# Patient Record
Sex: Male | Born: 1995
Health system: Southern US, Community
[De-identification: ages and names within clinical notes are randomized; demographics above are authoritative.]

## PROBLEM LIST (undated history)

## (undated) DIAGNOSIS — F32A Depression, unspecified: Secondary | ICD-10-CM

## (undated) DIAGNOSIS — F329 Major depressive disorder, single episode, unspecified: Secondary | ICD-10-CM

## (undated) DIAGNOSIS — R899 Unspecified abnormal finding in specimens from other organs, systems and tissues: Secondary | ICD-10-CM

## (undated) DIAGNOSIS — F1911 Other psychoactive substance abuse, in remission: Secondary | ICD-10-CM

## (undated) DIAGNOSIS — F29 Unspecified psychosis not due to a substance or known physiological condition: Secondary | ICD-10-CM

## (undated) DIAGNOSIS — F319 Bipolar disorder, unspecified: Secondary | ICD-10-CM

## (undated) DIAGNOSIS — F419 Anxiety disorder, unspecified: Secondary | ICD-10-CM

## (undated) HISTORY — DX: Other psychoactive substance abuse, in remission: F19.11

## (undated) HISTORY — PX: TONSILLECTOMY AND ADENOIDECTOMY: SHX28

## (undated) HISTORY — DX: Unspecified abnormal finding in specimens from other organs, systems and tissues: R89.9

## (undated) HISTORY — DX: Major depressive disorder, single episode, unspecified: F32.9

## (undated) HISTORY — DX: Unspecified psychosis not due to a substance or known physiological condition: F29

## (undated) HISTORY — DX: Anxiety disorder, unspecified: F41.9

## (undated) HISTORY — DX: Bipolar disorder, unspecified: F31.9

## (undated) HISTORY — DX: Depression, unspecified: F32.A

---
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
## 2014-01-14 ENCOUNTER — Telehealth (HOSPITAL_COMMUNITY): Payer: Self-pay

## 2014-01-14 ENCOUNTER — Other Ambulatory Visit (HOSPITAL_COMMUNITY): Payer: Self-pay | Admitting: Physician Assistant

## 2014-01-14 DIAGNOSIS — F32A Depression, unspecified: Secondary | ICD-10-CM

## 2014-01-14 DIAGNOSIS — F3110 Bipolar disorder, current episode manic without psychotic features, unspecified: Secondary | ICD-10-CM

## 2014-01-14 DIAGNOSIS — F329 Major depressive disorder, single episode, unspecified: Secondary | ICD-10-CM

## 2014-01-14 MED ORDER — DIVALPROEX SODIUM 500 MG PO DR TAB: DELAYED_RELEASE_TABLET | ORAL | Status: DC

## 2014-01-14 MED ORDER — TRAZODONE HCL 50 MG PO TABS: 50.0000 mg | ORAL_TABLET | Freq: Every day | ORAL | Status: DC

## 2014-01-14 MED ORDER — RISPERIDONE 1 MG PO TABS: ORAL_TABLET | ORAL | Status: DC

## 2014-01-14 MED ORDER — BENZTROPINE MESYLATE 1 MG PO TABS: 1.0000 mg | ORAL_TABLET | Freq: Two times a day (BID) | ORAL | Status: DC

## 2014-01-14 NOTE — Telephone Encounter (Signed)
PT needs a script for Depakote  divalproex (DEPAKOTE) 500 MG DR tablet (3 pills a day)  1 -in the am 2- in the pm  Please send it to the pharmacy  - PT will be out of this on Monday.   benztropine (COGENTIN) 1 MG tablet - PT will run out of this on the 27th and needs approval risperiDONE (RISPERDAL) 1 MG tablet - PT has plenty of this but would like speak to you about changing to something else, he feels this makes his brain mushy.    Please call grandmother if you have any questions.   Aldean BakerLaura Levin work # [phone] Home # [phone]

## 2014-01-14 NOTE — Telephone Encounter (Signed)
Medication refilled as noted. Rona RavensNeil T. Angeliyah Kirkey RPAC 7:18 PM 01/14/2014

## 2014-01-15 NOTE — Telephone Encounter (Signed)
Medication is refilled and LMOM stating such, and that we would discuss making changes to his medication on his next office visit. He can call back if he has any questions. Rona RavensNeil T. Jaicion Laurie RPAC 9:47 AM 01/15/2014

## 2014-02-11 ENCOUNTER — Encounter (INDEPENDENT_AMBULATORY_CARE_PROVIDER_SITE_OTHER): Payer: Self-pay

## 2014-02-11 ENCOUNTER — Ambulatory Visit (INDEPENDENT_AMBULATORY_CARE_PROVIDER_SITE_OTHER): Payer: BC Managed Care – PPO | Admitting: Physician Assistant

## 2014-02-11 VITALS — BP 124/81 | HR 90 | Ht 67.0 in | Wt 237.0 lb

## 2014-02-11 DIAGNOSIS — F3110 Bipolar disorder, current episode manic without psychotic features, unspecified: Secondary | ICD-10-CM

## 2014-02-11 DIAGNOSIS — F250 Schizoaffective disorder, bipolar type: Secondary | ICD-10-CM

## 2014-02-11 DIAGNOSIS — F1911 Other psychoactive substance abuse, in remission: Secondary | ICD-10-CM

## 2014-02-11 DIAGNOSIS — F32A Depression, unspecified: Secondary | ICD-10-CM

## 2014-02-11 DIAGNOSIS — F329 Major depressive disorder, single episode, unspecified: Secondary | ICD-10-CM

## 2014-02-11 DIAGNOSIS — R899 Unspecified abnormal finding in specimens from other organs, systems and tissues: Secondary | ICD-10-CM

## 2014-02-11 MED ORDER — DIVALPROEX SODIUM 500 MG PO DR TAB: DELAYED_RELEASE_TABLET | ORAL | Status: DC

## 2014-02-11 MED ORDER — RISPERIDONE 1 MG PO TABS: ORAL_TABLET | ORAL | Status: DC

## 2014-02-11 MED ORDER — ARIPIPRAZOLE 5 MG PO TABS: ORAL_TABLET | ORAL | Status: DC

## 2014-02-11 NOTE — Progress Notes (Signed)
[facility] [id number] Progress Note  Andrew LuisRobert Noble [id number] 18 y.o.  02/11/2014 3:21 PM  Chief Complaint: Bipolar disorder Schizoaffective type  History of Present Illness:  Subjective:Andrew Noble is in with his grandparents today. He is anxious to discuss medication changes as his prolactin level was elevated as was his depakote level.  He describes "medicine head." and feeling internal anxiety and nervousness. He also notes that he is feeling much better than he did upon his arrival.  He says he continues to stutter especially when speaking with his parents on the phone.  He also notes that he has difficulty remembering things and often loses things.  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  [name] is also now doing some modeling with him as well.  [name] is currently planning on being an "elf" with his grandfather during the holidays as he is the ReunionSanta at many local events including the [employer].  He does note that he is much improved since his first visit here and that the medication has been helpful to him.  Objective: [name] is A&O x 3, he is neatly dressed, good eye contact, speech is clear w/o hesitation or stuttering. No SI/HI or AVH. No racing thoughts or obsessions. He can focus and follow a line of thought. Some difficulty describing what sounds like cognitive slowing, but he can get the point across.    Suicidal Ideation: No Plan Formed: No Patient has means to carry out plan: No  Homicidal Ideation: No Plan Formed: No Patient has means to carry out plan: No  Review of Systems: Psychiatric: Agitation: No Hallucination: No Depressed Mood: Yes 4/10 Insomnia: No Hypersomnia: No Altered Concentration: Yes fogets things and loses things Feels Worthless: No Grandiose Ideas:  No Belief In Special Powers: No New/Increased Substance Abuse: No Compulsions: Yes  pacing  Neurologic: Headache: No Seizure: No Paresthesias: No  Past Medical Family, Social History: No changes  Outpatient Encounter Prescriptions as of 12/17/2013  Medication Sig  . benztropine (COGENTIN) 1 MG tablet Take 1 tablet (1 mg total) by mouth 2 (two) times daily.  . divalproex (DEPAKOTE) 500 MG DR tablet Take one tablet each morning (500mg ) and two tablets each evening at bedtime (1000mg ).  . risperiDONE (RISPERDAL) 1 MG tablet Take one tablet 1mg  po each morning, and  2mg  (two tablets) at bedtime.  . traZODone (DESYREL) 50 MG tablet Take 1 tablet (50 mg total) by mouth at bedtime.  . hydrOXYzine (ATARAX/VISTARIL) 25 MG tablet     Past Psychiatric History/Hospitalization(s): Anxiety: Yes  8/10 Bipolar Disorder: Yes Depression: Yes  7/10 but notes that it is situational and comes and goes Mania: No Psychosis: No Schizophrenia: No Personality Disorder: No Hospitalization for psychiatric illness: Yes History of Electroconvulsive Shock Therapy: No Prior Suicide Attempts: Yes  Physical Exam: Constitutional:  BP 124/81  Pulse 90  Ht 5\' 7"  (1.702 m)  Wt 237 lb (107.502 kg)  BMI 37.11 kg/m2  General Appearance: alert, oriented, no acute distress  Musculoskeletal: Strength & Muscle Tone: within normal limits Gait & Station: normal Patient leans: N/A  Psychiatric: Speech (describe rate, volume, coherence, spontaneity, and abnormalities if any): normal  Thought Process (describe rate, content, abstract reasoning, and computation): goal directed and relevant  Associations: Coherent and Relevant  Thoughts: normal  Mental Status: Orientation: oriented to place, time/date,  situation, day of week and month of year Mood & Affect: normal affect Attention Span & Concentration: fair  Medical Decision Making (Choose Three): Established Problem, Stable/Improving (1) and New Problem,  with no additional work-up planned (3)  Assessment: AXIS I  Bipolar schizoaffective type, Possible Akathesia, abnormal lab results  AXIS II  Deferred   AXIS III  Past Medical History    Diagnosis  Date    .  Depression     .  Psychosis     .  Anxiety     .  Bipolar disorder    AXIS IV  occupational problems, problems related to social environment, problems with access to health care services and problems with primary support group   AXIS V  61-70 mild symptoms        Plan: 1. Bipolar disorder schizoaffective type:  After discussion with patient and grand parents, he would like to discontinue the Risperdal and try Abilify.  Consult done with Pharm D. Via phone who suggested the following: A. Stop morning Risperdal dose. B. Add Abilify 5mg  each morning. C. Continue the PM dose of Risperdal as a cross taper over next several weeks. D. Follow up in 2-3 weeks. Will then increase Abilify if tolerated, and continue to decrease the Risperdal. C. Once Risperdal d/c is complete will then recheck Depakote levels with a goal of decreasing this as his last levels were 121.9. 2. Grandparents understand as does the patient on changes in regimen. 3. Greater than 505 of this visit is spent in counseling and education. 4. Follow up 2-3 weeks. Jennalee Greaves, PA-C 02/11/2014

## 2014-02-12 ENCOUNTER — Encounter (HOSPITAL_COMMUNITY): Payer: Self-pay | Admitting: Physician Assistant

## 2014-02-12 DIAGNOSIS — F1911 Other psychoactive substance abuse, in remission: Secondary | ICD-10-CM

## 2014-02-12 DIAGNOSIS — R899 Unspecified abnormal finding in specimens from other organs, systems and tissues: Secondary | ICD-10-CM

## 2014-02-12 HISTORY — DX: Unspecified abnormal finding in specimens from other organs, systems and tissues: R89.9

## 2014-02-12 HISTORY — DX: Other psychoactive substance abuse, in remission: F19.11

## 2014-03-05 ENCOUNTER — Ambulatory Visit (HOSPITAL_COMMUNITY): Payer: Self-pay | Admitting: Physician Assistant

## 2014-03-06 ENCOUNTER — Ambulatory Visit (INDEPENDENT_AMBULATORY_CARE_PROVIDER_SITE_OTHER): Payer: BC Managed Care – PPO | Admitting: Physician Assistant

## 2014-03-06 ENCOUNTER — Encounter (HOSPITAL_COMMUNITY): Payer: Self-pay | Admitting: Physician Assistant

## 2014-03-06 DIAGNOSIS — F250 Schizoaffective disorder, bipolar type: Secondary | ICD-10-CM

## 2014-03-06 DIAGNOSIS — F3110 Bipolar disorder, current episode manic without psychotic features, unspecified: Secondary | ICD-10-CM

## 2014-03-06 DIAGNOSIS — F329 Major depressive disorder, single episode, unspecified: Secondary | ICD-10-CM

## 2014-03-06 DIAGNOSIS — F32A Depression, unspecified: Secondary | ICD-10-CM

## 2014-03-06 MED ORDER — ARIPIPRAZOLE 10 MG PO TABS: ORAL_TABLET | ORAL | Status: DC

## 2014-03-06 NOTE — Progress Notes (Signed)
[facility] [id number] Progress Note  Darrin LuisRobert Underdown [id number] 18 y.o.  03/06/2014 3:58 PM  Chief Complaint: Bipolar disorder Schizoaffective type  History of Present Illness: Medication reduction Subjective: Patient has done well with the reduction in Risperdal and the addition of Abilify. NO complaints, no side effects and feels that his head is clearing.   Objective: [name] is looking good more polished, speech is clear. 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 No new problems. Suicidal Ideation: No Plan Formed: No Patient has means to carry out plan: No  Homicidal Ideation: No Plan Formed: No Patient has means to carry out plan: No  Review of Systems: Psychiatric: Agitation: No Hallucination: No Depressed Mood: Yes 4/10 Insomnia: No Hypersomnia: No Altered Concentration: Yes fogets things and loses things Feels Worthless: No Grandiose Ideas: No Belief In Special Powers: No New/Increased Substance Abuse: No Compulsions: Yes  pacing  Neurologic: Headache: No Seizure: No Paresthesias: No  Past Medical Family, Social History: No changes  Outpatient Encounter Prescriptions as of 12/17/2013  Medication Sig  . benztropine (COGENTIN) 1 MG tablet Take 1 tablet (1 mg total) by mouth 2 (two) times daily.  . divalproex (DEPAKOTE) 500 MG DR tablet Take one tablet each morning (500mg ) and two tablets each evening at bedtime (1000mg ).  . risperiDONE (RISPERDAL) 1 MG tablet Take one tablet 1mg  po each morning, and  2mg  (two tablets) at bedtime.  . traZODone (DESYREL) 50 MG tablet Take 1 tablet (50 mg total) by mouth at bedtime.  . hydrOXYzine (ATARAX/VISTARIL) 25 MG tablet     Past Psychiatric History/Hospitalization(s): Anxiety: Yes  8/10 Bipolar Disorder: Yes Depression: Yes  7/10 but notes that it is situational and comes and goes Mania: No Psychosis: No Schizophrenia: No Personality Disorder: No Hospitalization for  psychiatric illness: Yes History of Electroconvulsive Shock Therapy: No Prior Suicide Attempts: Yes  Physical Exam: Constitutional:  There were no vitals taken for this visit.  General Appearance: alert, oriented, no acute distress  Musculoskeletal: Strength & Muscle Tone: within normal limits Gait & Station: normal Patient leans: N/A  Psychiatric: Speech (describe rate, volume, coherence, spontaneity, and abnormalities if any): normal  Thought Process (describe rate, content, abstract reasoning, and computation): goal directed and relevant  Associations: Coherent and Relevant  Thoughts: normal  Mental Status: Orientation: oriented to place, time/date, situation, day of week and month of year Mood & Affect: normal affect Attention Span & Concentration: fair  Medical Decision Making (Choose Three): Established Problem, Stable/Improving (1) and New Problem, with no additional work-up planned (3)  Assessment: AXIS I  Bipolar schizoaffective type, Possible Akathesia, abnormal lab results  AXIS II  Deferred   AXIS III  Past Medical History    Diagnosis  Date    .  Depression     .  Psychosis     .  Anxiety     .  Bipolar disorder    AXIS IV  occupational problems, problems related to social environment, problems with access to health care services and problems with primary support group   AXIS V  61-70 mild symptoms        Plan: 1. Bipolar disorder schizoaffective type:   2.  Will increase the Abilify to 10mg  po each AM. 3. Decrease PM dose of Risperdal to 1mg  at hs. 4. Continue all other medications as written. 5. Follow up 2-3 weeks. 6. Can take 0.5mg  of Risperdal extra at hs if symptoms increase with reduction in pm dose.  C. Once Risperdal d/c is complete will then recheck Depakote levels with a goal of decreasing this as his last levels were 121.9. 2. Grandparents understand as does the patient on changes in regimen. 3. Greater than 50% of this visit is spent  in counseling and education. 4. Follow up 2-3 weeks. Brynlyn Dade, PA-C 03/06/2014

## 2014-03-06 NOTE — Patient Instructions (Signed)
1. Take 10mg  of Abilify each AM. 2. Take 1mg  of Risperdal at bedtime. 3. Take all other medications as written. 4. You make take 1/2 of a Risperdal (0.5) mg if symptoms increase. 5. Follow up 2-3 weeks.

## 2014-03-07 ENCOUNTER — Other Ambulatory Visit (HOSPITAL_COMMUNITY): Payer: Self-pay | Admitting: Physician Assistant

## 2014-03-13 ENCOUNTER — Telehealth (HOSPITAL_COMMUNITY): Payer: Self-pay | Admitting: Physician Assistant

## 2014-03-18 ENCOUNTER — Ambulatory Visit (HOSPITAL_COMMUNITY): Payer: Self-pay | Admitting: Physician Assistant

## 2014-03-21 ENCOUNTER — Other Ambulatory Visit (HOSPITAL_COMMUNITY): Payer: Self-pay | Admitting: *Deleted

## 2014-03-21 DIAGNOSIS — F329 Major depressive disorder, single episode, unspecified: Secondary | ICD-10-CM

## 2014-03-21 DIAGNOSIS — F32A Depression, unspecified: Secondary | ICD-10-CM

## 2014-03-21 DIAGNOSIS — F3110 Bipolar disorder, current episode manic without psychotic features, unspecified: Secondary | ICD-10-CM

## 2014-03-21 MED ORDER — DIVALPROEX SODIUM 500 MG PO DR TAB: DELAYED_RELEASE_TABLET | ORAL | Status: DC

## 2014-03-21 NOTE — Telephone Encounter (Signed)
Called pharmacy for medication refill for Depakote 500 mg, Qty 90, per Dr. Gilmore LarocheAkhtar. Pt has a follow up appt on 12/9. Called and informed pt prescription will be ready on 12/4. Pt states and shows understanding.

## 2014-03-21 NOTE — Telephone Encounter (Signed)
Please call in script to pharmacy.

## 2014-03-26 ENCOUNTER — Ambulatory Visit (HOSPITAL_COMMUNITY): Payer: Self-pay | Admitting: Physician Assistant

## 2014-03-27 ENCOUNTER — Ambulatory Visit (HOSPITAL_COMMUNITY): Payer: Self-pay | Admitting: Physician Assistant

## 2014-03-28 ENCOUNTER — Ambulatory Visit (INDEPENDENT_AMBULATORY_CARE_PROVIDER_SITE_OTHER): Payer: BC Managed Care – PPO | Admitting: Physician Assistant

## 2014-03-28 ENCOUNTER — Encounter (INDEPENDENT_AMBULATORY_CARE_PROVIDER_SITE_OTHER): Payer: Self-pay

## 2014-03-28 ENCOUNTER — Encounter (HOSPITAL_COMMUNITY): Payer: Self-pay | Admitting: Physician Assistant

## 2014-03-28 VITALS — BP 119/74 | HR 95 | Ht 67.0 in | Wt 238.0 lb

## 2014-03-28 DIAGNOSIS — F32A Depression, unspecified: Secondary | ICD-10-CM

## 2014-03-28 DIAGNOSIS — F329 Major depressive disorder, single episode, unspecified: Secondary | ICD-10-CM

## 2014-03-28 DIAGNOSIS — F250 Schizoaffective disorder, bipolar type: Secondary | ICD-10-CM

## 2014-03-28 DIAGNOSIS — F3110 Bipolar disorder, current episode manic without psychotic features, unspecified: Secondary | ICD-10-CM

## 2014-03-28 NOTE — Progress Notes (Signed)
[facility] [id number] Progress [facility]  Andrew Noble [id number] 18 y.o.  03/28/2014 3:40 PM  Chief Complaint: Bipolar disorder Schizoaffective type  History of Present Illness: Medication reduction Subjective: Andrew MaduroRobert is in today to follow up on his bipolar disorder. He continues to do well. He reports no difficulty with the reduction in the risperdal. He feels more energetic, he states his stuttering is decreasing, but still notes that he occasionally will forget his words or lose his train of thought.  Objective: patient is responding well to medication and shows no indication of decreased cognitive functioning.   Suicidal Ideation: No Plan Formed: No Patient has means to carry out plan: No  Homicidal Ideation: No Plan Formed: No Patient has means to carry out plan: No  Review of Systems: Psychiatric: Agitation: No Hallucination: No Depressed Mood: Yes 4/10 Insomnia: No Hypersomnia: No Altered Concentration: Yes fogets things and loses things Feels Worthless: No Grandiose Ideas: No Belief In Special Powers: No New/Increased Substance Abuse: No Compulsions: Yes  pacing  Neurologic: Headache: No Seizure: No Paresthesias: No  Past Medical Family, Social History: No changes  Outpatient Encounter Prescriptions as of 12/17/2013  Medication Sig  . benztropine (COGENTIN) 1 MG tablet Take 1 tablet (1 mg total) by mouth 2 (two) times daily.  . divalproex (DEPAKOTE) 500 MG DR tablet Take one tablet each morning (500mg ) and two tablets each evening at bedtime (1000mg ).  . risperiDONE (RISPERDAL) 1 MG tablet Take one tablet 1mg  po each morning, and  2mg  (two tablets) at bedtime.  . traZODone (DESYREL) 50 MG tablet Take 1 tablet (50 mg total) by mouth at bedtime.  . hydrOXYzine (ATARAX/VISTARIL) 25 MG tablet     Past Psychiatric History/Hospitalization(s): Anxiety: Yes  8/10 Bipolar Disorder: Yes Depression: Yes  7/10 but notes that it is situational and comes and  goes Mania: No Psychosis: No Schizophrenia: No Personality Disorder: No Hospitalization for psychiatric illness: Yes History of Electroconvulsive Shock Therapy: No Prior Suicide Attempts: Yes  Physical Exam: Constitutional:  BP 119/74 mmHg  Pulse 95  Ht 5\' 7"  (1.702 m)  Wt 238 lb (107.956 kg)  BMI 37.27 kg/m2  General Appearance: alert, oriented, no acute distress  Musculoskeletal: Strength & Muscle Tone: within normal limits Gait & Station: normal Patient leans: N/A  Psychiatric: Speech (describe rate, volume, coherence, spontaneity, and abnormalities if any): normal  Thought Process (describe rate, content, abstract reasoning, and computation): goal directed and relevant  Associations: Coherent and Relevant  Thoughts: normal  Mental Status: Orientation: oriented to place, time/date, situation, day of week and month of year Mood & Affect: normal affect Attention Span & Concentration: fair  Medical Decision Making (Choose Three): Established Problem, Stable/Improving (1) and New Problem, with no additional work-up planned (3)  Assessment: AXIS I  Bipolar schizoaffective type, Possible Akathesia, abnormal lab results  AXIS II  Deferred   AXIS III  Past Medical History    Diagnosis  Date    .  Depression     .  Psychosis     .  Anxiety     .  Bipolar disorder    AXIS IV  occupational problems, problems related to social environment, problems with access to health care services and problems with primary support group   AXIS V  61-70 mild symptoms        Plan: 1. Bipolar disorder schizoaffective type:    1. Will continue the Abilify to 10mg  po each AM   2. D/C Risperdal completely.   3. Will  follow up in 1 month and continue other medications as noted. 4. Must see therapist as discussed. Andrew Janssen, PA-C 03/28/2014

## 2014-04-17 ENCOUNTER — Other Ambulatory Visit (HOSPITAL_COMMUNITY): Payer: Self-pay | Admitting: *Deleted

## 2014-04-17 DIAGNOSIS — F32A Depression, unspecified: Secondary | ICD-10-CM

## 2014-04-17 DIAGNOSIS — F329 Major depressive disorder, single episode, unspecified: Secondary | ICD-10-CM

## 2014-04-17 DIAGNOSIS — F3110 Bipolar disorder, current episode manic without psychotic features, unspecified: Secondary | ICD-10-CM

## 2014-04-17 NOTE — Telephone Encounter (Signed)
PT's grandmother called for a refill for Depakote. [facility] [place] per Verne SpurrNeil Mashburn, [place], pt is authorized for 1 refill, Qty 90. Pt has follow up w/ Lloyd HugerNeil on 1/12. Pt states and shows understanding.

## 2014-04-29 ENCOUNTER — Ambulatory Visit (INDEPENDENT_AMBULATORY_CARE_PROVIDER_SITE_OTHER): Payer: BLUE CROSS/BLUE SHIELD | Admitting: Physician Assistant

## 2014-04-29 ENCOUNTER — Encounter (INDEPENDENT_AMBULATORY_CARE_PROVIDER_SITE_OTHER): Payer: Self-pay

## 2014-04-29 ENCOUNTER — Encounter (HOSPITAL_COMMUNITY): Payer: Self-pay | Admitting: Physician Assistant

## 2014-04-29 VITALS — BP 130/69 | HR 86 | Ht 67.0 in | Wt 246.0 lb

## 2014-04-29 DIAGNOSIS — F3110 Bipolar disorder, current episode manic without psychotic features, unspecified: Secondary | ICD-10-CM

## 2014-04-29 DIAGNOSIS — F32A Depression, unspecified: Secondary | ICD-10-CM

## 2014-04-29 DIAGNOSIS — F250 Schizoaffective disorder, bipolar type: Secondary | ICD-10-CM

## 2014-04-29 DIAGNOSIS — F329 Major depressive disorder, single episode, unspecified: Secondary | ICD-10-CM

## 2014-04-29 DIAGNOSIS — F1911 Other psychoactive substance abuse, in remission: Secondary | ICD-10-CM

## 2014-04-29 MED ORDER — ARIPIPRAZOLE 10 MG PO TABS: ORAL_TABLET | ORAL | Status: DC

## 2014-04-29 NOTE — Patient Instructions (Signed)
1. Continue all medication as ordered. 2. Call this office if you have any questions or concerns. 3. Continue to get regular exercise 3-5 times a week. 4. Continue to eat a healthy nutritionally balanced diet. 5. Continue to reduce stress and anxiety through activities such as yoga, mindfulness, meditation and or prayer. 6. Keep all appointments with your out patient therapist and have notes forwarded to this office. (If you do not have one and would like to be scheduled with a therapist, please let our office assist you with this. 7. Follow up as planned 1 month. 8. Labs as directed.

## 2014-04-29 NOTE — Addendum Note (Signed)
Addended by: Verne SpurrMASHBURN, Nayvie Lips T on: 04/29/2014 04:02 PM   Modules accepted: Orders

## 2014-04-29 NOTE — Progress Notes (Signed)
[facility] [id number] Progress Note  Andrew Noble [id number] 19 y.o.  04/29/2014 3:10 PM  Chief Complaint: Bipolar disorder Schizoaffective type  History of Present Illness: Medication reduction Subjective: Andrew Noble is in today to follow up on his bipolar disorder. He meets with me privately to discuss some concerns he has been having.  Objective: patient is responding well to medication and shows no indication of decreased cognitive functioning.   Suicidal Ideation: No Plan Formed: No Patient has means to carry out plan: No  Homicidal Ideation: No Plan Formed: No Patient has means to carry out plan: No  Review of Systems: Psychiatric: Agitation: No Hallucination: No Depressed Mood: Yes 4/10 Insomnia: No Hypersomnia: No Altered Concentration: Yes fogets things and loses things Feels Worthless: No Grandiose Ideas: No Belief In Special Powers: No New/Increased Substance Abuse: No Compulsions: Yes  pacing  Neurologic: Headache: No Seizure: No Paresthesias: No  Past Medical Family, Social History: No changes  Outpatient Encounter Prescriptions as of 12/17/2013  Medication Sig  . benztropine (COGENTIN) 1 MG tablet Take 1 tablet (1 mg total) by mouth 2 (two) times daily.  . divalproex (DEPAKOTE) 500 MG DR tablet Take one tablet each morning ( ) and two tablets each evening at bedtime ( ).  . risperiDONE (RISPERDAL) 1 MG tablet Take one tablet  po each morning, and   (two tablets) at bedtime.  . traZODone (DESYREL) 50 MG tablet Take 1 tablet (50 mg total) by mouth at bedtime.  . hydrOXYzine (ATARAX/VISTARIL) 25 MG tablet     Past Psychiatric History/Hospitalization(s): Anxiety: Yes  8/10 Bipolar Disorder: Yes Depression: Yes  7/10 but notes that it is situational and comes and goes Mania: No Psychosis: No Schizophrenia: No Personality Disorder: No Hospitalization for psychiatric illness: Yes History of Electroconvulsive Shock Therapy:  No Prior Suicide Attempts: Yes  Physical Exam: Constitutional:  BP 130/69 mmHg  Pulse 86  Ht  (1.702 m)  Wt 246 lb (111.585 kg)  BMI 38.52 kg/m2  General Appearance: alert, oriented, no acute distress  Musculoskeletal: Strength & Muscle Tone: within normal limits Gait & Station: normal Patient leans: N/A  Psychiatric: Speech (describe rate, volume, coherence, spontaneity, and abnormalities if any): normal  Thought Process (describe rate, content, abstract reasoning, and computation): goal directed and relevant  Associations: Coherent and Relevant  Thoughts: normal  Mental Status: Orientation: oriented to place, time/date, situation, day of week and month of year Mood & Affect: normal affect Attention Span & Concentration: fair  Medical Decision Making (Choose Three): Established Problem, Stable/Improving (1) and New Problem, with no additional work-up planned (3)  Assessment: AXIS I  Bipolar schizoaffective type, Possible Akathesia, abnormal lab results  AXIS II  Deferred   AXIS III  Past Medical History    Diagnosis  Date    .  Depression     .  Psychosis     .  Anxiety     .  Bipolar disorder    AXIS IV  occupational problems, problems related to social environment, problems with access to health care services and problems with primary support group   AXIS V  61-70 mild symptoms        Plan: 1. Bipolar disorder schizoaffective type:    1. Will continue the Abilify to  po each hs   2.Will d/c AM depakote dose and order depakote level.   3. Will follow up in 1 month and continue other medications as noted. 4. Must see therapist as discussed. 5. Take meds at night as  discussed. 6. Regular sleep cycle. Andrew Landress, PA-C 04/29/2014

## 2014-04-30 LAB — VALPROIC ACID LEVEL: Valproic Acid Lvl: 83.4 ug/mL (ref 50.0–100.0)

## 2014-04-30 LAB — PROLACTIN: Prolactin: 5 ng/mL (ref 2.1–17.1)

## 2014-05-02 ENCOUNTER — Other Ambulatory Visit (HOSPITAL_COMMUNITY): Payer: Self-pay | Admitting: Physician Assistant

## 2014-05-08 MED ORDER — DIVALPROEX SODIUM 500 MG PO DR TAB: DELAYED_RELEASE_TABLET | ORAL | Status: DC

## 2014-05-14 ENCOUNTER — Other Ambulatory Visit (HOSPITAL_COMMUNITY): Payer: Self-pay | Admitting: Physician Assistant

## 2014-05-30 ENCOUNTER — Encounter (INDEPENDENT_AMBULATORY_CARE_PROVIDER_SITE_OTHER): Payer: Self-pay

## 2014-05-30 ENCOUNTER — Encounter (HOSPITAL_COMMUNITY): Payer: Self-pay | Admitting: Physician Assistant

## 2014-05-30 ENCOUNTER — Ambulatory Visit (INDEPENDENT_AMBULATORY_CARE_PROVIDER_SITE_OTHER): Payer: BLUE CROSS/BLUE SHIELD | Admitting: Physician Assistant

## 2014-05-30 VITALS — BP 127/84 | HR 85 | Ht 67.0 in | Wt 241.0 lb

## 2014-05-30 DIAGNOSIS — F3110 Bipolar disorder, current episode manic without psychotic features, unspecified: Secondary | ICD-10-CM

## 2014-05-30 DIAGNOSIS — F32A Depression, unspecified: Secondary | ICD-10-CM

## 2014-05-30 DIAGNOSIS — F329 Major depressive disorder, single episode, unspecified: Secondary | ICD-10-CM

## 2014-05-30 DIAGNOSIS — F250 Schizoaffective disorder, bipolar type: Secondary | ICD-10-CM

## 2014-05-30 MED ORDER — DIVALPROEX SODIUM 500 MG PO DR TAB: DELAYED_RELEASE_TABLET | ORAL | Status: DC

## 2014-05-30 MED ORDER — ARIPIPRAZOLE 10 MG PO TABS: ORAL_TABLET | ORAL | Status: DC

## 2014-05-30 NOTE — Progress Notes (Signed)
[facility] [id number] Progress Note  Andrew Noble [id number] 18 y.o.  05/30/2014 4:08 PM  Chief Complaint: Bipolar disorder Schizoaffective type  History of Present Illness: Medication reduction Subjective: Patient is in alone today. He has seen his therapist and discussed finding something that can occupy his time and make him productive.  He now has a goal chart regarding his interests.  He felt that he was more depressed because he really doesn't know what he wants to do currently and is looking for a job.  Objective: patient is responding well to medication and shows no indication of decreased cognitive functioning. He continues to work out and notes that it does help. He reports being very emotional when speaking to his dad, he becomes very sad.  He says he remembers things from his past.   Suicidal Ideation: No Plan Formed: No Patient has means to carry out plan: No  Homicidal Ideation: No Plan Formed: No Patient has means to carry out plan: No  Review of Systems: Psychiatric: Agitation: No Hallucination: No Depressed Mood: Yes 4/10 Insomnia: No Hypersomnia: No Altered Concentration: Yes fogets things and loses things Feels Worthless: No Grandiose Ideas: No Belief In Special Powers: No New/Increased Substance Abuse: No Compulsions: Yes  pacing  Neurologic: Headache: No Seizure: No Paresthesias: No  Past Medical Family, Social History: No changes  Outpatient Encounter Prescriptions as of 12/17/2013  Medication Sig  . benztropine (COGENTIN) 1 MG tablet Take 1 tablet (1 mg total) by mouth 2 (two) times daily.  . divalproex (DEPAKOTE) 500 MG DR tablet Take one tablet each morning ( ) and two tablets each evening at bedtime ( ).  . risperiDONE (RISPERDAL) 1 MG tablet Take one tablet  po each morning, and   (two tablets) at bedtime.  . traZODone (DESYREL) 50 MG tablet Take 1 tablet (50 mg total) by mouth at bedtime.  . hydrOXYzine  (ATARAX/VISTARIL) 25 MG tablet     Past Psychiatric History/Hospitalization(s): Anxiety: Yes  5-6/10 Bipolar Disorder: Yes Depression: Yes  4/10 but notes that it is situational and comes and goes Mania: No Psychosis: No Schizophrenia: No Personality Disorder: No Hospitalization for psychiatric illness: Yes History of Electroconvulsive Shock Therapy: No Prior Suicide Attempts: Yes  Physical Exam: Constitutional:  BP 127/84 mmHg  Pulse 85  Ht  (1.702 m)  Wt 241 lb (109.317 kg)  BMI 37.74 kg/m2  General Appearance: alert, oriented, no acute distress  Musculoskeletal: Strength & Muscle Tone: within normal limits Gait & Station: normal Patient leans: N/A  Psychiatric: Speech (describe rate, volume, coherence, spontaneity, and abnormalities if any): normal  Thought Process (describe rate, content, abstract reasoning, and computation): goal directed and relevant  Associations: Coherent and Relevant  Thoughts: normal  Mental Status: Orientation: oriented to place, time/date, situation, day of week and month of year Mood & Affect: normal affect Attention Span & Concentration: fair  Medical Decision Making (Choose Three): Established Problem, Stable/Improving (1) and New Problem, with no additional work-up planned (3)  Assessment: AXIS I  Bipolar schizoaffective type, Possible Akathesia, abnormal lab results  AXIS II  Deferred   AXIS III  Past Medical History    Diagnosis  Date    .  Depression     .  Psychosis     .  Anxiety     .  Bipolar disorder    AXIS IV  occupational problems, problems related to social environment, problems with access to health care services and problems with primary support group   AXIS V  61-70 mild symptoms        Plan: 1. Bipolar disorder schizoaffective type:    1. Will continue the Abilify to 10mg  po each hs   2  Andrew Noble MaduroRobert will continue his medication as noted currently. We will watch and see giving him more time between med  adjustments.  3.. He will continue on the 1000mg  of depakote at bedtime as written. 4.. Must see therapist as discussed. 5. Take meds at night as discussed. 6.  Regular sleep cycle. Andrew Clemons, PA-C 05/30/2014

## 2014-05-30 NOTE — Patient Instructions (Signed)
1. Continue all medication as ordered. 2. Call this office if you have any questions or concerns. 3. Continue to get regular exercise 3-5 times a week. 4. Continue to eat a healthy nutritionally balanced diet. 5. Continue to reduce stress and anxiety through activities such as yoga, mindfulness, meditation and or prayer. 6. Keep all appointments with your out patient therapist and have notes forwarded to this office. (If you do not have one and would like to be scheduled with a therapist, please let our office assist you with this. 7. Follow up as planned 8 weeks. 

## 2014-06-03 ENCOUNTER — Other Ambulatory Visit (HOSPITAL_COMMUNITY): Payer: Self-pay | Admitting: Physician Assistant

## 2014-06-06 ENCOUNTER — Other Ambulatory Visit (HOSPITAL_COMMUNITY): Payer: Self-pay | Admitting: *Deleted

## 2014-06-06 DIAGNOSIS — F3110 Bipolar disorder, current episode manic without psychotic features, unspecified: Secondary | ICD-10-CM

## 2014-06-06 DIAGNOSIS — F329 Major depressive disorder, single episode, unspecified: Secondary | ICD-10-CM

## 2014-06-06 DIAGNOSIS — F32A Depression, unspecified: Secondary | ICD-10-CM

## 2014-06-06 MED ORDER — DIVALPROEX SODIUM 500 MG PO DR TAB: DELAYED_RELEASE_TABLET | ORAL | Status: DC

## 2014-06-06 NOTE — Telephone Encounter (Signed)
Received phone call from BasaltAshley from [facility] for a refill for Depakote  Per Morrie SheldonAshley, they received the prescription for Abilify, but not the Depakote. Per Verne SpurrNeil Mashburn, PA-C, pt is authorized for a refill for Depakote 500mg , Qty 90. Pt has a f/u appt on 4/8.  Pharmacy will notify pt of prescription status.

## 2014-06-26 ENCOUNTER — Other Ambulatory Visit (HOSPITAL_COMMUNITY): Payer: Self-pay | Admitting: Physician Assistant

## 2014-07-01 ENCOUNTER — Other Ambulatory Visit (HOSPITAL_COMMUNITY): Payer: Self-pay | Admitting: Physician Assistant

## 2014-07-03 ENCOUNTER — Telehealth (HOSPITAL_COMMUNITY): Payer: Self-pay | Admitting: *Deleted

## 2014-07-03 DIAGNOSIS — F32A Depression, unspecified: Secondary | ICD-10-CM

## 2014-07-03 DIAGNOSIS — F3110 Bipolar disorder, current episode manic without psychotic features, unspecified: Secondary | ICD-10-CM

## 2014-07-03 DIAGNOSIS — F329 Major depressive disorder, single episode, unspecified: Secondary | ICD-10-CM

## 2014-07-03 MED ORDER — DIVALPROEX SODIUM 500 MG PO DR TAB
DELAYED_RELEASE_TABLET | ORAL | Status: DC
Start: 2014-07-03 — End: 2014-07-23

## 2014-07-03 NOTE — Telephone Encounter (Signed)
Pt's grandmother called for a refill for Depakote 500mg .  Pt's grandmother states insurance will only pay for a 30 day supply, Qty 60 of Depakote. Per Verne SpurrNeil Mashburn, PA-C, pt is authorized for a refill of Depakote 500mg , Qty 60. [facility] Pharmacy will notify pt of prescription status. Pt has a f/u appt on 4/8.

## 2014-07-23 ENCOUNTER — Telehealth (HOSPITAL_COMMUNITY): Payer: Self-pay | Admitting: *Deleted

## 2014-07-23 DIAGNOSIS — F32A Depression, unspecified: Secondary | ICD-10-CM

## 2014-07-23 DIAGNOSIS — F329 Major depressive disorder, single episode, unspecified: Secondary | ICD-10-CM

## 2014-07-23 DIAGNOSIS — F3110 Bipolar disorder, current episode manic without psychotic features, unspecified: Secondary | ICD-10-CM

## 2014-07-23 MED ORDER — DIVALPROEX SODIUM 500 MG PO DR TAB: DELAYED_RELEASE_TABLET | ORAL | Status: DC

## 2014-07-23 MED ORDER — ARIPIPRAZOLE 10 MG PO TABS: ORAL_TABLET | ORAL | Status: DC

## 2014-07-23 NOTE — Telephone Encounter (Signed)
Pt called for prescription refill for Abilify 10mg  and Depakote 500mg . Per Verne SpurrNeil Mashburn, PA-C, pt is authorized for a refill for Abilify 10mg , Qty 30 and Depakote 500mg , Qty 60. Prescription were sent to pharmacy. Called and informed pt of prescription status. Pt states and shows understanding.

## 2014-07-25 ENCOUNTER — Ambulatory Visit (HOSPITAL_COMMUNITY): Payer: Self-pay | Admitting: Physician Assistant

## 2014-08-06 ENCOUNTER — Encounter (HOSPITAL_COMMUNITY): Payer: Self-pay | Admitting: Physician Assistant

## 2014-08-06 ENCOUNTER — Ambulatory Visit (INDEPENDENT_AMBULATORY_CARE_PROVIDER_SITE_OTHER): Payer: BLUE CROSS/BLUE SHIELD | Admitting: Physician Assistant

## 2014-08-06 VITALS — BP 127/91 | HR 100

## 2014-08-06 DIAGNOSIS — F329 Major depressive disorder, single episode, unspecified: Secondary | ICD-10-CM

## 2014-08-06 DIAGNOSIS — F250 Schizoaffective disorder, bipolar type: Secondary | ICD-10-CM | POA: Diagnosis not present

## 2014-08-06 DIAGNOSIS — F32A Depression, unspecified: Secondary | ICD-10-CM

## 2014-08-06 DIAGNOSIS — F3110 Bipolar disorder, current episode manic without psychotic features, unspecified: Secondary | ICD-10-CM

## 2014-08-06 MED ORDER — ARIPIPRAZOLE 10 MG PO TABS: ORAL_TABLET | ORAL | Status: DC

## 2014-08-06 MED ORDER — DIVALPROEX SODIUM 500 MG PO DR TAB: DELAYED_RELEASE_TABLET | ORAL | Status: DC

## 2014-08-06 NOTE — Patient Instructions (Signed)
1. Take all of your medications as discussed with your provider. (Please check your AVS, for the list.) 2. Call this office for any questions or problems. 3. Be sure to get plenty of rest and try for 7-9 hours of quality sleep each night. 4. Try to get regular exercise, at least 15-30 minutes each day.  A good walk will help tremendously! 5. Remember to do your mindfulness each day, breath deeply in and out, while having quiet reflection, prayer, meditation, or positive visualization. Unplug and turn off all electronic devices each day for your own personal time without interruption. This works! There are studies to back this up! 6. Be sure to take your B complex and Vitamin D3 each day. This will improve your overall wellbeing and boost your immune system as well. 7. Try to eat a nutritious healthy diet and avoid excessive alcohol and ALL tobacco products. 8. Be sure to keep all of your appointments with your outpatient therapist. If you do not have one, our office will be happy to assist you with this. 9. Be sure to keep your next follow up appointment in 3 months. 

## 2014-08-06 NOTE — Progress Notes (Signed)
  [facility] [id number] Progress Note  Andrew LuisRobert Noble [id number] 18 y.o.  08/06/2014 3:37 PM  Chief Complaint: Bipolar disorder Schizoaffective type  History of Present Illness: Medication reduction Subjective:  Andrew MaduroRobert is doing very well. He is currently working at [employer] about 30+ hours per week. He likes it and his only problem is getting up in the morning.    Objective: Andrew MaduroRobert is doing very well. He is working and making good progress and he is well pleased with how things are going.     Suicidal Ideation: No Plan Formed: No Patient has means to carry out plan: No  Homicidal Ideation: No Plan Formed: No Patient has means to carry out plan: No  Review of Systems: Psychiatric: Agitation: No Hallucination: No Depressed Mood: Yes 4/10 Insomnia: No Hypersomnia: No Altered Concentration: Yes fogets things and loses things Feels Worthless: No Grandiose Ideas: No Belief In Special Powers: No New/Increased Substance Abuse: No Compulsions: Yes  pacing  Neurologic: Headache: No Seizure: No Paresthesias: No  Past Medical Family, Social History: No changes  Outpatient Encounter Prescriptions as of 12/17/2013  Medication Sig  . benztropine (COGENTIN) 1 MG tablet Take 1 tablet (1 mg total) by mouth 2 (two) times daily.  . divalproex (DEPAKOTE) 500 MG DR tablet Take one tablet each morning (500mg ) and two tablets each evening at bedtime (1000mg ).  . risperiDONE (RISPERDAL) 1 MG tablet Take one tablet 1mg  po each morning, and  2mg  (two tablets) at bedtime.  . traZODone (DESYREL) 50 MG tablet Take 1 tablet (50 mg total) by mouth at bedtime.  . hydrOXYzine (ATARAX/VISTARIL) 25 MG tablet     Past Psychiatric History/Hospitalization(s): Anxiety: Yes  5-6/10 Bipolar Disorder: Yes Depression: Yes  4/10 but notes that it is situational and comes and goes Mania: No Psychosis: No Schizophrenia: No Personality Disorder: No Hospitalization for psychiatric illness:  Yes History of Electroconvulsive Shock Therapy: No Prior Suicide Attempts: Yes  Physical Exam: Constitutional:  BP 127/91 mmHg  Pulse 100  General Appearance: alert, oriented, no acute distress  Musculoskeletal: Strength & Muscle Tone: within normal limits Gait & Station: normal Patient leans: N/A  Psychiatric: Speech (describe rate, volume, coherence, spontaneity, and abnormalities if any): normal  Thought Process (describe rate, content, abstract reasoning, and computation): goal directed and relevant  Associations: Coherent and Relevant  Thoughts: normal  Mental Status: Orientation: oriented to place, time/date, situation, day of week and month of year Mood & Affect: normal affect Attention Span & Concentration: fair  Medical Decision Making (Choose Three): Established Problem, Stable/Improving (1) and New Problem, with no additional work-up planned (3)  Assessment: AXIS I  Bipolar schizoaffective type, Possible Akathesia, abnormal lab results  AXIS II  Deferred   AXIS III  Past Medical History    Diagnosis  Date    .  Depression     .  Psychosis     .  Anxiety     .  Bipolar disorder    AXIS IV  occupational problems, problems related to social environment, problems with access to health care services and problems with primary support group   AXIS V  61-70 mild symptoms        Plan: 1. Bipolar disorder schizoaffective type:   1. Continue current medication as written. 2. Follow up in 3 months. 3. Follow up with therapist as discussed. 4. Call for questions or problems.  Ustin Cruickshank, PA-C 08/06/2014

## 2014-10-21 ENCOUNTER — Telehealth (HOSPITAL_COMMUNITY): Payer: Self-pay

## 2014-10-21 DIAGNOSIS — F329 Major depressive disorder, single episode, unspecified: Secondary | ICD-10-CM

## 2014-10-21 DIAGNOSIS — F32A Depression, unspecified: Secondary | ICD-10-CM

## 2014-10-21 DIAGNOSIS — F3110 Bipolar disorder, current episode manic without psychotic features, unspecified: Secondary | ICD-10-CM

## 2014-10-21 MED ORDER — ARIPIPRAZOLE 10 MG PO TABS
ORAL_TABLET | ORAL | Status: DC
Start: 2014-10-21 — End: 2014-10-31

## 2014-10-21 NOTE — Telephone Encounter (Signed)
PT would like to speak to you

## 2014-10-21 NOTE — Telephone Encounter (Signed)
Per Dawayne CirriNeil Mashbuurn, PA-C, pt will begin taking 5mg  of Abilify in the morning and 10mg  of Abilify at bedtime. Pt will continue taking Depakote as prescribed. Pt must keep all f/u appts. Pt will contact office if there are any questions or concerns or proceed to the nearest emergency room.  Called and informed pt of new direction for medication. Pt has a f/u appt on 7/15. Pt states and shows understanding.

## 2014-10-21 NOTE — Telephone Encounter (Signed)
Return call to pt. Pt states he is feeling more anxious and depressed. Pt states he is not having any HI or SI thoughts. Pt would like to discuss with Lloyd HugerNeil a medication change.

## 2014-10-29 ENCOUNTER — Other Ambulatory Visit (HOSPITAL_COMMUNITY): Payer: Self-pay | Admitting: Physician Assistant

## 2014-10-30 ENCOUNTER — Ambulatory Visit (HOSPITAL_COMMUNITY): Payer: Self-pay | Admitting: Physician Assistant

## 2014-10-31 ENCOUNTER — Ambulatory Visit (HOSPITAL_COMMUNITY): Payer: Self-pay | Admitting: Physician Assistant

## 2014-10-31 ENCOUNTER — Encounter (HOSPITAL_COMMUNITY): Payer: Self-pay | Admitting: Physician Assistant

## 2014-10-31 ENCOUNTER — Ambulatory Visit (INDEPENDENT_AMBULATORY_CARE_PROVIDER_SITE_OTHER): Payer: BLUE CROSS/BLUE SHIELD | Admitting: Physician Assistant

## 2014-10-31 VITALS — BP 126/74 | HR 80 | Ht 67.0 in | Wt 258.0 lb

## 2014-10-31 DIAGNOSIS — F3110 Bipolar disorder, current episode manic without psychotic features, unspecified: Secondary | ICD-10-CM

## 2014-10-31 DIAGNOSIS — F329 Major depressive disorder, single episode, unspecified: Secondary | ICD-10-CM

## 2014-10-31 DIAGNOSIS — F32A Depression, unspecified: Secondary | ICD-10-CM

## 2014-10-31 DIAGNOSIS — F250 Schizoaffective disorder, bipolar type: Secondary | ICD-10-CM

## 2014-10-31 MED ORDER — DIVALPROEX SODIUM 500 MG PO DR TAB: DELAYED_RELEASE_TABLET | ORAL | Status: DC

## 2014-10-31 MED ORDER — ARIPIPRAZOLE 10 MG PO TABS: ORAL_TABLET | ORAL | Status: DC

## 2014-10-31 NOTE — Progress Notes (Signed)
[facility] [id number] [facility]  Andrew Noble [id number] 19 y.o.  10/31/2014 10:14 AM  Chief Complaint: Bipolar disorder Schizoaffective type  History of Present Illness: Medication reduction Subjective:  Andrew MaduroRobert is doing very well. His medication was increased due to his report of worsening symptoms of anxiety and depression to include Abilify 5mg  in the morning and 10mg  at hs.  He reports that the changes helped a great deal and he feels much better. He is working a lot but he continues to see his therapist.  He is paying rent now and making an effort to be more helpful around the house.   Objective: Andrew MaduroRobert is doing very well. He is working and making good progress and he is well pleased with how things are going.   Suicidal Ideation: No Plan Formed: No Patient has means to carry out plan: No  Homicidal Ideation: No Plan Formed: No Patient has means to carry out plan: No  Review of Systems: Psychiatric: Agitation: No Hallucination: No Depressed Mood: No 4/10 Insomnia: No Hypersomnia: No Altered Concentration: None  Feels Worthless: No Grandiose Ideas: No Belief In Special Powers: No New/Increased Substance Abuse: No Compulsions: none  Neurologic: Headache: No Seizure: No Paresthesias: No  Past Medical Family, Social History: No changes  Outpatient Encounter Prescriptions as of 12/17/2013  Medication Sig  . benztropine (COGENTIN) 1 MG tablet Take 1 tablet (1 mg total) by mouth 2 (two) times daily.  . divalproex (DEPAKOTE) 500 MG DR tablet Take one tablet each morning (500mg ) and two tablets each evening at bedtime (1000mg ).  . risperiDONE (RISPERDAL) 1 MG tablet Take one tablet 1mg  po each morning, and  2mg  (two tablets) at bedtime.  . traZODone (DESYREL) 50 MG tablet Take 1 tablet (50 mg total) by mouth at bedtime.  . hydrOXYzine (ATARAX/VISTARIL) 25 MG tablet     Past Psychiatric History/Hospitalization(s): Anxiety: Yes  5-6/10 Bipolar Disorder:  Yes Depression: Yes  4/10 but notes that it is situational and comes and goes Mania: No Psychosis: No Schizophrenia: No Personality Disorder: No Hospitalization for psychiatric illness: Yes History of Electroconvulsive Shock Therapy: No Prior Suicide Attempts: Yes  Physical Exam: Constitutional:  BP 126/74 mmHg  Pulse 80  Ht 5\' 7"  (1.702 m)  Wt 258 lb (117.028 kg)  BMI 40.40 kg/m2  SpO2 96%  Total Time spent with patient: 30 minutes  Psychiatric Specialty Exam: Physical Exam  ROS  Blood pressure 126/74, pulse 80, height 5\' 7"  (1.702 m), weight 258 lb (117.028 kg), SpO2 96 %.Body mass index is 40.4 kg/(m^2).  General Appearance: Neat and Well Groomed  Patent attorneyye Contact::  Good  Speech:  Clear and Coherent  Volume:  Normal  Mood:  Euthymic  Affect:  Congruent  Thought Process:  Coherent, Goal Directed, Intact, Linear and Logical  Orientation:  Full (Time, Place, and Person)  Thought Content:  WDL  Suicidal Thoughts:  No  Homicidal Thoughts:  No  Memory:  NA  Judgement:  Good  Insight:  Shallow  Psychomotor Activity:  Normal  Concentration:  Good  Recall:  Good  Fund of Knowledge:Fair  Language: Good  Akathisia:  No  Handed:  Right  AIMS (if indicated):     Assets:  Communication Skills Desire for Improvement Financial Resources/Insurance Housing Leisure Time Physical Health Resilience Social Support Talents/Skills Transportation  Sleep:       Musculoskeletal: Strength & Muscle Tone: within normal limits Gait & Station: normal Patient leans: N/A Medical Decision Making (Choose Three): Established Problem, Stable/Improving (1) and  New Problem, with no additional work-up planned (3) Plan: Medication management as written. Continue with out patient therapist  1. Bipolar disorder schizoaffective type:   1. Continue current medication as written. 2. Follow up in 3 months. 3. Follow up with therapist as discussed. 4. Call for questions or problems. 5. Will  order Depakote level today.  Candia Kingsbury, PA-C 10/31/2014

## 2014-10-31 NOTE — Patient Instructions (Signed)
1. Take all of your medications as discussed with your provider. (Please check your AVS, for the list.)  2. Call this office for any questions or problems.  3. Be sure to get plenty of rest and try for 7-9 hours of quality sleep each night.  4. Try to get regular exercise, at least 15-30 minutes each day.  A good walk will help tremendously!  5. Remember to do your mindfulness each day, breath deeply in and out, while having quiet reflection, prayer, meditation, or positive visualization. Unplug and turn off all electronic devices each day for your own personal time without interruption. This works! There are studies to back this up!  6. Be sure to take your B complex and Vitamin D3 each day. This will improve your overall wellbeing and boost your immune system as well.  Evidence based medicine supports that taking these two supplements will improve your overall mental health.  Vitamin D3 (5000 i.u.) take one per day. B complex take one per day.    7. Try to eat a nutritious healthy diet and avoid excessive alcohol and ALL tobacco products.  8. Be sure to keep all of your appointments with your outpatient therapist. If you do not have one, our office will be happy to assist you with this.  9. Be sure to keep your next follow up appointment 3 months with [name].

## 2014-11-01 LAB — VALPROIC ACID LEVEL: Valproic Acid Lvl: 84.5 ug/mL (ref 50.0–100.0)

## 2014-11-05 ENCOUNTER — Ambulatory Visit (HOSPITAL_COMMUNITY): Payer: Self-pay | Admitting: Physician Assistant

## 2014-11-27 ENCOUNTER — Ambulatory Visit (HOSPITAL_COMMUNITY): Payer: Self-pay | Admitting: Medical

## 2014-12-11 ENCOUNTER — Ambulatory Visit (INDEPENDENT_AMBULATORY_CARE_PROVIDER_SITE_OTHER): Payer: BLUE CROSS/BLUE SHIELD | Admitting: Medical

## 2014-12-11 ENCOUNTER — Encounter (HOSPITAL_COMMUNITY): Payer: Self-pay | Admitting: Medical

## 2014-12-11 VITALS — BP 126/70 | HR 85 | Ht 67.0 in | Wt 236.4 lb

## 2014-12-11 DIAGNOSIS — F1911 Other psychoactive substance abuse, in remission: Secondary | ICD-10-CM

## 2014-12-11 DIAGNOSIS — Z8659 Personal history of other mental and behavioral disorders: Secondary | ICD-10-CM

## 2014-12-11 DIAGNOSIS — F319 Bipolar disorder, unspecified: Secondary | ICD-10-CM

## 2014-12-11 DIAGNOSIS — F250 Schizoaffective disorder, bipolar type: Secondary | ICD-10-CM | POA: Diagnosis not present

## 2014-12-11 DIAGNOSIS — F32A Depression, unspecified: Secondary | ICD-10-CM

## 2014-12-11 DIAGNOSIS — F329 Major depressive disorder, single episode, unspecified: Secondary | ICD-10-CM

## 2014-12-11 MED ORDER — BUPROPION HCL ER (XL) 150 MG PO TB24: ORAL_TABLET | ORAL | Status: DC

## 2014-12-11 MED ORDER — ARIPIPRAZOLE 10 MG PO TABS: ORAL_TABLET | ORAL | Status: DC

## 2014-12-11 NOTE — Progress Notes (Addendum)
[facility] [id number] Progress Note  Andrew Noble [id number] 19 y.o.  12/11/2014 11:55 AM  Chief Complaint: Bipolar disorder Schizoaffective type  History of Present Illness: Pt returns 2 months prior to scheduled visit for c/o somnolence /fatigue Subjective:  Taray was doing well until recent onset of somnolence/hypersomnolence with intermittent feeling of depression. His medication was increased due to his report of worsening symptoms of anxiety and depression to include Abilify  in the morning and  at hs.  He reported that the changes helped a great deal and he feels much better but noe he is sleeping "all the time". He is working but sleepiness is interfering. He continues to see his therapist.  He is paying rent now and making an effort to be more helpful around the house.   Objective:    Suicidal Ideation: No Plan Formed: No Patient has means to carry out plan: No  Homicidal Ideation: No Plan Formed: No Patient has means to carry out plan: No  Review of Systems: Psychiatric: Agitation: No Hallucination: No Depressed Mood:  4/10 Insomnia: No Hypersomnia: YES Altered Concentration: Some  Feels Worthless: No Grandiose Ideas: No Belief In Special Powers: No New/Increased Substance Abuse: No Compulsions: none  Neurologic: Headache: No Seizure: No Paresthesias: No  Past Medical Family, Social History: No changes  Outpatient Encounter Prescriptions as of 12/17/2013  Medication Sig  . benztropine (COGENTIN) 1 MG tablet Take 1 tablet (1 mg total) by mouth 2 (two) times daily.  . divalproex (DEPAKOTE) 500 MG DR tablet Take one tablet each morning ( ) and two tablets each evening at bedtime ( ).  . risperiDONE (RISPERDAL) 1 MG tablet Take one tablet  po each morning, and   (two tablets) at bedtime.  . traZODone (DESYREL) 50 MG tablet Take 1 tablet (50 mg total) by mouth at bedtime.  . hydrOXYzine (ATARAX/VISTARIL) 25 MG tablet     Past  Psychiatric History/Hospitalization(s): Anxiety: Yes  5-6/10 Bipolar Disorder: Yes Depression: Yes  4/10 but notes that it is situational and comes and goes Mania: No Psychosis: No Schizophrenia: No Personality Disorder: No Hospitalization for psychiatric illness: Yes History of Electroconvulsive Shock Therapy: No Prior Suicide Attempts: Yes  Physical Exam: Constitutional:  BP 126/70 mmHg  Pulse 85  Ht  (1.702 m)  Wt 236 lb 6.4 oz (107.23 kg)  BMI 37.02 kg/m2  SpO2 97%  Total Time spent with patient: 30 minutes  Psychiatric Specialty Exam: Physical Exam  ROS  Blood pressure 126/70, pulse 85, height  (1.702 m), weight 236 lb 6.4 oz (107.23 kg), SpO2 97 %.Body mass index is 37.02 kg/(m^2).  General Appearance: Neat and Well Groomed  Patent attorney::  Good  Speech:  Clear and Coherent  Volume:  Normal  Mood:  Euthymic  Affect:  Congruent  Thought Process:  Coherent, Goal Directed, Intact, Linear and Logical  Orientation:  Full (Time, Place, and Person)  Thought Content:  WDL  Suicidal Thoughts:  No  Homicidal Thoughts:  No  Memory:  NA  Judgement:  Good  Insight:  Shallow  Psychomotor Activity:  Normal  Concentration:  Good  Recall:  Good  Fund of Knowledge:Fair  Language: Good  Akathisia:  No  Handed:  Right  AIMS (if indicated):     Assets:  Communication Skills Desire for Improvement Financial Resources/Insurance Housing Leisure Time Physical Health Resilience Social Support Talents/Skills Transportation  Sleep:       Musculoskeletal: Strength & Muscle Tone: within normal limits Gait & Station: normal Patient leans:  N/A Medical Decision Making (Choose Three): Established Problem, Stable/Improving (1) and New Problem, with no additional work-up planned (3) Plan: Medication management as written. Continue with out patient therapist  1. Bipolar disorder schizoaffective type:   1. Decrease Abilify to 5mg  am/pm 2. Start Wellbutrin 150 XL 1-2x  daily 3. Follow up with therapist as discussed. 4 FU 1 month sooner if needed . 12/11/2014

## 2015-01-05 ENCOUNTER — Other Ambulatory Visit (HOSPITAL_COMMUNITY): Payer: Self-pay | Admitting: Medical

## 2015-01-05 ENCOUNTER — Telehealth (HOSPITAL_COMMUNITY): Payer: Self-pay | Admitting: *Deleted

## 2015-01-05 DIAGNOSIS — F329 Major depressive disorder, single episode, unspecified: Secondary | ICD-10-CM

## 2015-01-05 DIAGNOSIS — F32A Depression, unspecified: Secondary | ICD-10-CM

## 2015-01-05 DIAGNOSIS — F319 Bipolar disorder, unspecified: Secondary | ICD-10-CM

## 2015-01-05 MED ORDER — BUPROPION HCL ER (XL) 150 MG PO TB24: ORAL_TABLET | ORAL | Status: DC

## 2015-01-05 NOTE — Telephone Encounter (Signed)
Spoke with pt. Pt states he is sleeping better with the Wellbutrin . Pt states he increased his medication from  to  2 weeks ago and would like to give it a few more weeks before he is seen by Maryjean Morn. Informed pt, if his symptoms continue or become worse, he will need to contact the office or proceed to his nearest emergency room. Pt verbalizes understanding.

## 2015-01-05 NOTE — Telephone Encounter (Signed)
Pt called stating he is having crying spells, and feeling more depressed. Please call to advise at [phone].

## 2015-01-05 NOTE — Telephone Encounter (Signed)
OK 

## 2015-01-05 NOTE — Telephone Encounter (Signed)
Pt is currently taking Abilify , Wellbutrin , Depakote . Pt is taking medications as prescribed. PT states he is not having any suicidal or homicidal thoughts.

## 2015-01-05 NOTE — Telephone Encounter (Signed)
Need to know what medications he is taking now and dosages Also-?suicidal thinking-if yes needs to come to [facility] at [facility] for assessment-if no then would like to see him Thursday please

## 2015-01-05 NOTE — Telephone Encounter (Signed)
Thanks-sent new rx for wellbutrin

## 2015-01-29 ENCOUNTER — Encounter (HOSPITAL_COMMUNITY): Payer: Self-pay

## 2015-01-29 ENCOUNTER — Encounter (HOSPITAL_COMMUNITY): Payer: Self-pay | Admitting: Medical

## 2015-01-29 ENCOUNTER — Ambulatory Visit (INDEPENDENT_AMBULATORY_CARE_PROVIDER_SITE_OTHER): Payer: BLUE CROSS/BLUE SHIELD | Admitting: Medical

## 2015-01-29 VITALS — BP 124/70 | HR 80 | Ht 67.0 in | Wt 257.0 lb

## 2015-01-29 DIAGNOSIS — F250 Schizoaffective disorder, bipolar type: Secondary | ICD-10-CM | POA: Diagnosis not present

## 2015-01-29 DIAGNOSIS — F325 Major depressive disorder, single episode, in full remission: Secondary | ICD-10-CM

## 2015-01-29 DIAGNOSIS — F3110 Bipolar disorder, current episode manic without psychotic features, unspecified: Secondary | ICD-10-CM

## 2015-01-29 DIAGNOSIS — F418 Other specified anxiety disorders: Secondary | ICD-10-CM

## 2015-01-29 MED ORDER — CLONAZEPAM 0.5 MG PO TABS: 0.5000 mg | ORAL_TABLET | Freq: Two times a day (BID) | ORAL | Status: DC

## 2015-01-29 MED ORDER — ARIPIPRAZOLE 5 MG PO TABS: ORAL_TABLET | ORAL | Status: DC

## 2015-01-29 MED ORDER — DIVALPROEX SODIUM 500 MG PO DR TAB: DELAYED_RELEASE_TABLET | ORAL | Status: DC

## 2015-01-29 MED ORDER — BUPROPION HCL ER (XL) 300 MG PO TB24: ORAL_TABLET | ORAL | Status: DC

## 2015-01-29 NOTE — Progress Notes (Signed)
[facility] [id number] Progress Note  Andrew Noble [id number] 19 y.o.  01/29/2015 6:10 PM  Chief Complaint: Bipolar disorder Schizoaffective type  History of Present Illness: Pt returns 2 months prior to scheduled visit for c/o somnolence /fatigue on current medications Subjective:  Andrew Noble was doing well until recent onset of somnolence/hypersomnolence with intermittent feeling of depression. His medication was increased due to his report of worsening symptoms of anxiety and depression to include Abilify  in the morning and  at hs.  He reported that the changes helped a great deal and he feels much better but noe he is sleeping "all the time". He is working but sleepiness is interfering. He continues to see his therapist.  He is paying rent now and making an effort to be more helpful around the house. His grandfather is with him today.Andrew Noble are trying to help Andrew Noble become more self sufficient in getting to work.He has tried bicycling but found it too dangerous on major roads.Grandfather feels Andrew Noble has memory issues with directions as he is teaching him to drive.Abb says it is his anxiety not his memory that is the problem.    Suicidal Ideation: No Plan Formed: No Patient has means to carry out plan: No  Homicidal Ideation: No Plan Formed: No Patient has means to carry out plan: No  Review of Systems: Psychiatric: Agitation: No Hallucination: No Depressed Mood:  4/10 Insomnia: No Hypersomnia: YES Altered Concentration: Some  Feels Worthless: No Grandiose Ideas: No Belief In Special Powers: No New/Increased Substance Abuse: No Compulsions: none  Neurologic: Headache: No Seizure: No Paresthesias: No  Past Medical Family, Social History: No changes  Outpatient Encounter Prescriptions as of 12/17/2013  Medication Sig  . benztropine (COGENTIN) 1 MG tablet Take 1 tablet (1 mg total) by mouth 2 (two) times daily.  . divalproex (DEPAKOTE) 500 MG DR tablet  Take one tablet each morning ( ) and two tablets each evening at bedtime ( ).  . risperiDONE (RISPERDAL) 1 MG tablet Take one tablet  po each morning, and   (two tablets) at bedtime.  . traZODone (DESYREL) 50 MG tablet Take 1 tablet (50 mg total) by mouth at bedtime.  . hydrOXYzine (ATARAX/VISTARIL) 25 MG tablet     Past Psychiatric History/Hospitalization(s): Anxiety: Yes  5-6/10 Bipolar Disorder: Yes Depression: Yes  4/10 but notes that it is situational and comes and goes Mania: No Psychosis: No Schizophrenia: No Personality Disorder: No Hospitalization for psychiatric illness: Yes History of Electroconvulsive Shock Therapy: No Prior Suicide Attempts: Yes  Physical Exam: Constitutional:  BP 124/70 mmHg  Pulse 80  Ht  (1.702 m)  Wt 257 lb (116.574 kg)  BMI 40.24 kg/m2  SpO2 94%  Total Time spent with patient: 30 minutes  Psychiatric Specialty Exam: Physical Exam  ROS  Blood pressure 124/70, pulse 80, height  (1.702 m), weight 257 lb (116.574 kg), SpO2 94 %.Body mass index is 40.24 kg/(m^2).  General Appearance: Neat and Well Groomed  Patent attorney::  Good  Speech:  Clear and Coherent  Volume:  Normal  Mood:  Euthymic  Affect:  Congruent  Thought Process:  Coherent, Goal Directed, Intact, Linear and Logical  Orientation:  Full (Time, Place, and Person)  Thought Content:  WDL  Suicidal Thoughts:  No  Homicidal Thoughts:  No  Memory:  NA  Judgement:  Good  Insight:  Shallow  Psychomotor Activity:  Normal  Concentration:  Good  Recall:  Good  Fund of Knowledge:Fair  Language: Good  Akathisia:  No  Handed:  Right  AIMS (if indicated):     Assets:  Communication Skills Desire for Improvement Financial Resources/Insurance Housing Leisure Time Physical Health Resilience Social Support Talents/Skills Transportation  Sleep:       Musculoskeletal: Strength & Muscle Tone: within normal limits Gait & Station: normal Patient leans:  N/A Medical Decision Making (Choose Three): Established Problem, Stable/Improving (1) and New Problem, with no additional work-up planned (3) Plan: Medication management as written. Continue with out patient therapist  1. Bipolar disorder schizoaffective type:   1. Decrease Abilify to 5mg  am/pm 2. Continue Wellbutrin XL 300 mg daily 3. Klonopin 0.5 mg 1-2x daily PRN anxiety 4. Follow up with therapist as discussed. 5. FU 1 month sooner if needed . 01/29/2015

## 2015-02-05 ENCOUNTER — Telehealth (HOSPITAL_COMMUNITY): Payer: Self-pay | Admitting: *Deleted

## 2015-02-05 MED ORDER — BUPROPION HCL ER (XL) 300 MG PO TB24: ORAL_TABLET | ORAL | Status: DC

## 2015-02-05 NOTE — Telephone Encounter (Signed)
Pt's grandmother called for prescription clarification about Wellbutrin 300mg  and Abilify 5mg . Pt's grandmother would like to know if he needs to take 1 or 2 tablet for Wellbutrin. Pt was previously taking 2 tablets (300mg  total) in the morning. The directions for Abilify states to take 1 tablet (5mg  total) in the morning and 1 tablet (10mg  total) at night. Pt's grandmother would like to know if he needs to take 5mg  in the morning and 5 mg at night of Abilify?  Please call to advise [phone].

## 2015-02-05 NOTE — Telephone Encounter (Signed)
Per [name], please inform pt he will need to take the Wellbutrin 1 tablet (300mg  total) in the morning and the Abilify 1 tablet (5mg  total) in the mornings and evening. Pt verbalizes understanding.

## 2015-02-26 ENCOUNTER — Other Ambulatory Visit (HOSPITAL_COMMUNITY): Payer: Self-pay | Admitting: Physician Assistant

## 2015-03-05 ENCOUNTER — Encounter (HOSPITAL_COMMUNITY): Payer: Self-pay | Admitting: Medical

## 2015-03-05 ENCOUNTER — Ambulatory Visit (INDEPENDENT_AMBULATORY_CARE_PROVIDER_SITE_OTHER): Payer: BLUE CROSS/BLUE SHIELD | Admitting: Medical

## 2015-03-05 VITALS — BP 126/70 | HR 94 | Ht 67.0 in | Wt 259.0 lb

## 2015-03-05 DIAGNOSIS — F411 Generalized anxiety disorder: Secondary | ICD-10-CM

## 2015-03-05 DIAGNOSIS — Z8659 Personal history of other mental and behavioral disorders: Secondary | ICD-10-CM

## 2015-03-05 DIAGNOSIS — F250 Schizoaffective disorder, bipolar type: Secondary | ICD-10-CM

## 2015-03-05 DIAGNOSIS — F959 Tic disorder, unspecified: Secondary | ICD-10-CM | POA: Insufficient documentation

## 2015-03-05 DIAGNOSIS — F319 Bipolar disorder, unspecified: Secondary | ICD-10-CM

## 2015-03-05 NOTE — Progress Notes (Signed)
[facility] Behavioral Health Follow-up Outpatient Visit  Andrew Noble 12/12/1995  Date: 03/05/2015   Subjective: " Im doing well" Grandfather concurs but notes transportation for wirk is still a problem as [name] is afraid to bicycle on highways and he has r trouble remebering directions driving.  HPI: Andrew Noble returns for 1 month FU for medication management of his bipolar disorder and anxiety for which he was placed on low dose Klonopin PRN.His only complaint today is that he occasinal lt eyelid has a nervous tic. He was concerned this represented dyskinesia/oculugyric side effect he had read about.   Review of Systems: Psychiatric: Agitation: No Hallucination: No Depressed Mood:  4/10 Insomnia: No Hypersomnia: YES Altered Concentration: Some  Feels Worthless: No Grandiose Ideas: No Belief In Special Powers: No New/Increased Substance Abuse: No Compulsions: none  Neurologic: Headache: No Seizure: No Paresthesias: No./Lt eye tic intermittent  MEDICATIONS:  ARIPiprazole (ABILIFY) 5 MG tablet [[id number]]              Dose, Route, Frequency: As Directed     Indications of Use: Manic Phase of Bipolar Mood Disorder    Dispense Quantity:  60 tablet Refills:  2 Fills Remaining:  2            Sig: Take 1 tablet (  total) in the morning and take 1 tablet (  total) at bedtime      Azelastine HCl 0.15 % SOLN [[id number]]              Dose: -- Route: -- Frequency: --    Dispense Quantity:  -- Refills:  -- Fills Remaining:  --            Sig: --           Written Date:  -- Expiration Date:  -- Ordering Date:  01/29/15     Start Date:  01/12/15 End Date:  --      Ordering Provider:  -- Authorizing Provider:  Historical Provider, MD Ordering User:  Melvern Sample, CMA       buPROPion (WELLBUTRIN XL) 300 MG 24 hr tablet [[id number]]              Dose, Route, Frequency: As Directed Order Comments:    PHARMACY PLEASE NOTE Correction of SIG from 01/29/15 RX         Indications of Use: Depressive Phase Bipolar Mood Disorder    Dispense Quantity:  30 tablet Refills:  2 Fills Remaining:  2            Sig: Take 1 tablet q am           Written Date:  02/05/15 Expiration Date:  02/05/16      Start Date:  02/05/15 End Date:  --        divalproex (DEPAKOTE) 500 MG DR tablet [[id number]]              Dose, Route, Frequency: As Directed     Indications of Use: Manic Phase of Bipolar Mood Disorder    Dispense Quantity:  60 tablet Refills:  2 Fills Remaining:  2            Sig: two tablets each evening at bedtime ( ).      clonazePAM (KLONOPIN) 0.5 MG tablet [[id number]]              Dose: 0.5 mg Route: Oral Frequency: 2 times daily    Dispense Quantity:  60 tablet Refills:  0 Fills Remaining:  0  Sig: Take 1 tablet (0.5 mg total) by mouth 2 (two) times daily. Take only As needed for anxiety                  Filed Vitals:   03/05/15 1114  BP: 126/70  Pulse: 94    Mental Status Examination  Appearance: Well groomed Alert: Yes Attention: good  Cooperative: Yes Eye Contact: Good Speech: Clear aqnd coherent Psychomotor Activity: Normal Memory/Concentration: Intact Oriented: person, place, time/date and situation Mood: Anxious and Euthymic Affect: Congruent Thought Processes and Associations: Coherent, Goal Directed and Emotional (anxious) Fund of Knowledge: Fair Thought Content:NO  Suicidal ideation, Homicidal ideation, Auditory hallucinations, Visual hallucinations, Delusions and Paranoia Insight: Limited Judgement: Fair  Diagnosis: Bipolar disorder Schizoaffective type ;GAD  Treatment Plan:Continue current medications;Pt reassured tic is not dyskinesia.FU 1 month  Maryjean Mornharles Brithany Whitworth, PA-C

## 2015-04-04 ENCOUNTER — Other Ambulatory Visit (HOSPITAL_COMMUNITY): Payer: Self-pay | Admitting: Physician Assistant

## 2015-04-09 ENCOUNTER — Ambulatory Visit (HOSPITAL_COMMUNITY): Payer: BLUE CROSS/BLUE SHIELD | Admitting: Medical

## 2015-04-09 ENCOUNTER — Ambulatory Visit (INDEPENDENT_AMBULATORY_CARE_PROVIDER_SITE_OTHER): Payer: BLUE CROSS/BLUE SHIELD | Admitting: Medical

## 2015-04-09 ENCOUNTER — Encounter (HOSPITAL_COMMUNITY): Payer: Self-pay | Admitting: Medical

## 2015-04-09 VITALS — BP 124/74 | HR 94 | Ht 67.0 in | Wt 262.0 lb

## 2015-04-09 DIAGNOSIS — Z8659 Personal history of other mental and behavioral disorders: Secondary | ICD-10-CM

## 2015-04-09 DIAGNOSIS — F418 Other specified anxiety disorders: Secondary | ICD-10-CM | POA: Diagnosis not present

## 2015-04-09 DIAGNOSIS — F319 Bipolar disorder, unspecified: Secondary | ICD-10-CM

## 2015-04-09 DIAGNOSIS — F325 Major depressive disorder, single episode, in full remission: Secondary | ICD-10-CM

## 2015-04-09 DIAGNOSIS — F250 Schizoaffective disorder, bipolar type: Secondary | ICD-10-CM | POA: Diagnosis not present

## 2015-04-09 DIAGNOSIS — F1911 Other psychoactive substance abuse, in remission: Secondary | ICD-10-CM

## 2015-04-09 NOTE — Progress Notes (Signed)
[facility] Follow-up Outpatient Visit  Andrew Noble November 18, 1995  Date: 12/22//2016   Subjective: " Im doing well" Grandfather concurs .They are inveswtigating Moped as means for Andrew MaduroRobert to get to work.  HPI: Andrew MaduroRobert returns for 1 month FU for medication management of his bipolar disorder and anxiety for which he was placed on low dose Klonopin PRN which he has stopped completely. Mood is stable.   Review of Systems: Psychiatric: Agitation: No Hallucination: No Depressed Mood:  4/10 Insomnia: No Hypersomnia: YES Altered Concentration: Some  Feels Worthless: No Grandiose Ideas: No Belief In Special Powers: No New/Increased Substance Abuse: No Compulsions: none  Neurologic: Headache: No Seizure: No Paresthesias: No./Lt eye tic intermittent  MEDICATIONS:  ARIPiprazole (ABILIFY) 5 MG tablet [[id number]][[id number]]              Dose, Route, Frequency: As Directed     Indications of Use: Manic Phase of Bipolar Mood Disorder    Dispense Quantity:  60 tablet Refills:  2 Fills Remaining:  2            Sig: Take 1 tablet ( 5mg  total) in the morning and take 1 tablet (10mg  total) at bedtime      Azelastine HCl 0.15 % SOLN [[id number]][[id number]]              Dose: -- Route: -- Frequency: --    Dispense Quantity:  -- Refills:  -- Fills Remaining:  --            Sig: --           Written Date:  -- Expiration Date:  -- Ordering Date:  01/29/15     Start Date:  01/12/15 End Date:  --      Ordering Provider:  -- Authorizing Provider:  Historical Provider, MD Ordering User:  Andrew SampleJovita T Noble, CMA       buPROPion (WELLBUTRIN XL) 300 MG 24 hr tablet [[id number]][[id number]]              Dose, Route, Frequency: As Directed Order Comments:    PHARMACY PLEASE NOTE Correction of SIG from 01/29/15 RX        Indications of Use: Depressive Phase Bipolar Mood Disorder    Dispense Quantity:  30 tablet Refills:  2 Fills Remaining:  2            Sig: Take 1 tablet q am           Written Date:   02/05/15 Expiration Date:  02/05/16      Start Date:  02/05/15 End Date:  --        divalproex (DEPAKOTE) 500 MG DR tablet [[id number]][[id number]]              Dose, Route, Frequency: As Directed     Indications of Use: Manic Phase of Bipolar Mood Disorder    Dispense Quantity:  60 tablet Refills:  2 Fills Remaining:  2            Sig: two tablets each evening at bedtime (1000mg ).      clonazePAM (KLONOPIN) 0.5 MG tablet [[id number]][[id number]]              Dose: 0.5 mg Route: Oral Frequency: 2 times daily    Dispense Quantity:  60 tablet Refills:  0 Fills Remaining:  0            Sig: Take 1 tablet (0.5 mg total) by mouth 2 (two) times daily. Take only As needed for anxiety  Filed Vitals:   04/09/15 1119  BP: 124/74  Pulse: 94    Mental Status Examination  Appearance: Well groomed Alert: Yes Attention: good  Cooperative: Yes Eye Contact: Good Speech: Clear aqnd coherent Psychomotor Activity: Normal Memory/Concentration: Intact Oriented: person, place, time/date and situation Mood: Euthymic Affect: Congruent Thought Processes and Associations: WDL Fund of Knowledge: Fair Thought Content:NO  Suicidal ideation, Homicidal ideation, Auditory hallucinations, Visual hallucinations, Delusions and Paranoia Insight: Limited Judgement: Fair  Diagnosis: Bipolar disorder Schizoaffective type Anxiety situational  Treatment Plan:Continue current medications;FU 3 months-sooner if needed.Grandfather agrees Wished Merry Christmas  Andrew Noble, [place]

## 2015-04-19 MED ORDER — BUPROPION HCL ER (XL) 300 MG PO TB24: ORAL_TABLET | ORAL | Status: DC

## 2015-04-19 MED ORDER — DIVALPROEX SODIUM 500 MG PO DR TAB
DELAYED_RELEASE_TABLET | ORAL | Status: DC
Start: 2015-04-19 — End: 2015-06-25

## 2015-04-19 MED ORDER — ARIPIPRAZOLE 5 MG PO TABS: ORAL_TABLET | ORAL | Status: DC

## 2015-04-28 ENCOUNTER — Telehealth (HOSPITAL_COMMUNITY): Payer: Self-pay | Admitting: *Deleted

## 2015-04-28 NOTE — Telephone Encounter (Signed)
Pt called stating he will need a prior authorization for Abilify 10mg . Called and Spoke w/ Vernona RiegerLaura at [facility]. Per Vernona RiegerLaura, pt will need to bring new insurance card to pharmacy for new prescription. LVM for return call to office.

## 2015-04-30 ENCOUNTER — Telehealth (HOSPITAL_COMMUNITY): Payer: Self-pay | Admitting: *Deleted

## 2015-04-30 ENCOUNTER — Other Ambulatory Visit (HOSPITAL_COMMUNITY): Payer: Self-pay | Admitting: Medical

## 2015-04-30 DIAGNOSIS — F325 Major depressive disorder, single episode, in full remission: Secondary | ICD-10-CM

## 2015-04-30 MED ORDER — ARIPIPRAZOLE 15 MG PO TABS: ORAL_TABLET | ORAL | Status: DC

## 2015-04-30 NOTE — Telephone Encounter (Signed)
Called and informed pt of prescription status. Pt verbalizes understanding. Pt has a f/u appt on 06/04/15.

## 2015-04-30 NOTE — Telephone Encounter (Signed)
rx sent-please call thanks

## 2015-04-30 NOTE — Telephone Encounter (Signed)
Received telephone call from [facility] for a refill for Abilify 5mg  TID. Per Pharmacy, pt will need a new prescription written for Abilify 15mg , #30 for insurance to approve refill. Pt is completely out of medication. Please call pt once prescription has been sent to pharmacy at [phone].

## 2015-06-04 ENCOUNTER — Ambulatory Visit (HOSPITAL_COMMUNITY): Payer: Self-pay | Admitting: Medical

## 2015-06-25 ENCOUNTER — Encounter (HOSPITAL_COMMUNITY): Payer: Self-pay | Admitting: Medical

## 2015-06-25 ENCOUNTER — Ambulatory Visit (INDEPENDENT_AMBULATORY_CARE_PROVIDER_SITE_OTHER): Payer: BLUE CROSS/BLUE SHIELD | Admitting: Medical

## 2015-06-25 VITALS — BP 124/68 | HR 95 | Ht 67.0 in | Wt 236.0 lb

## 2015-06-25 DIAGNOSIS — F250 Schizoaffective disorder, bipolar type: Secondary | ICD-10-CM | POA: Diagnosis not present

## 2015-06-25 DIAGNOSIS — Z87898 Personal history of other specified conditions: Secondary | ICD-10-CM | POA: Diagnosis not present

## 2015-06-25 DIAGNOSIS — Z8659 Personal history of other mental and behavioral disorders: Secondary | ICD-10-CM | POA: Diagnosis not present

## 2015-06-25 DIAGNOSIS — F1911 Other psychoactive substance abuse, in remission: Secondary | ICD-10-CM

## 2015-06-25 DIAGNOSIS — F319 Bipolar disorder, unspecified: Secondary | ICD-10-CM

## 2015-06-25 DIAGNOSIS — F411 Generalized anxiety disorder: Secondary | ICD-10-CM

## 2015-06-25 DIAGNOSIS — F325 Major depressive disorder, single episode, in full remission: Secondary | ICD-10-CM

## 2015-06-25 MED ORDER — BUPROPION HCL ER (XL) 300 MG PO TB24: ORAL_TABLET | ORAL | Status: DC

## 2015-06-25 MED ORDER — DIVALPROEX SODIUM 500 MG PO DR TAB
DELAYED_RELEASE_TABLET | ORAL | Status: DC
Start: 2015-06-25 — End: 2016-04-21

## 2015-06-25 MED ORDER — ARIPIPRAZOLE 15 MG PO TABS: ORAL_TABLET | ORAL | Status: DC

## 2015-06-25 NOTE — Progress Notes (Signed)
[facility] Behavioral Health Follow-up Outpatient Visit  Andrew Noble 1996-02-18  Date: 06/25/2015   Subjective:Per Mom-"I think he's doing well but he forgets to take his medicine" HPI: Andrew Noble returns for 3 month FU for medication management of his bipolar disorder.His anxiety is controlled with Depakote now and he no longer takes Klonopin.He presents with his mother today and has moved to a room/apt in [place] close to work. He has troublre remebering to take his medication, He does have a weekly pill pill box that can be separated daily.Pharmacy has changed to [facility]. Review of Systems: Psychiatric: Agitation: No Hallucination: No Depressed Mood: Euthymic/smiling Insomnia: No Hypersomnia: No Altered Concentration: Some  Feels Worthless: No Grandiose Ideas: No Belief In Special Powers: No New/Increased Substance Abuse: No Compulsions: none  Neurologic: Headache: No Seizure: No Paresthesias: No/Tic is gone  MEDICATIONS:  ARIPiprazole (ABILIFY) 5 MG tablet [[id number]]              Dose, Route, Frequency: As Directed     Indications of Use: Manic Phase of Bipolar Mood Disorder    Dispense Quantity:  60 tablet Refills:  2 Fills Remaining:  2            Sig: Take 1 tablet (  total) in the morning and take 1 tablet (  total) at bedtime      Azelastine HCl 0.15 % SOLN [[id number]]              Dose: -- Route: -- Frequency: --    Dispense Quantity:  -- Refills:  -- Fills Remaining:  --            Sig: --           Written Date:  -- Expiration Date:  -- Ordering Date:  01/29/15     Start Date:  01/12/15 End Date:  --      Ordering Provider:  -- Authorizing Provider:  Historical Provider, MD Ordering User:  Melvern Sample, CMA       buPROPion (WELLBUTRIN XL) 300 MG 24 hr tablet [[id number]]              Dose, Route, Frequency: As Directed Order Comments:    PHARMACY PLEASE NOTE Correction of SIG from 01/29/15 RX        Indications of Use:  Depressive Phase Bipolar Mood Disorder    Dispense Quantity:  30 tablet Refills:  2 Fills Remaining:  2            Sig: Take 1 tablet q am           Written Date:  02/05/15 Expiration Date:  02/05/16      Start Date:  02/05/15 End Date:  --        divalproex (DEPAKOTE) 500 MG DR tablet [[id number]]              Dose, Route, Frequency: As Directed     Indications of Use: Manic Phase of Bipolar Mood Disorder    Dispense Quantity:  60 tablet Refills:  2 Fills Remaining:  2            Sig: two tablets each evening at bedtime ( ).      clonazePAM (KLONOPIN) 0.5 MG tablet [[id number]]              Dose: 0.5 mg Route: Oral Frequency: 2 times daily    Dispense Quantity:  60 tablet Refills:  0 Fills Remaining:  0  Sig: Take 1 tablet (0.5 mg total) by mouth 2 (two) times daily. Take only As needed for anxiety                  Filed Vitals:   06/25/15 1014  BP: 124/68  Pulse: 95    Mental Status Examination  Appearance: Well groomed Alert: Yes Attention: good  Cooperative: Yes Eye Contact: Good Speech: Clear aqnd coherent Psychomotor Activity: Normal Memory/Concentration: Intact Oriented: person, place, time/date and situation Mood: Euthymic Affect: Congruent Thought Processes and Associations: WDL Fund of Knowledge: Fair Thought Content:NO  Suicidal ideation, Homicidal ideation, Auditory hallucinations, Visual hallucinations, Delusions and Paranoia Insight: Limited Judgement: Fair  Diagnosis: Bipolar disorder Schizoaffective type controlled with meds.Living semi independently with help/coaching from Moml  Treatment Plan:Continue current medications;FU 3 months-sooner if needed.Grandfather agrees Wished Merry Christmas  Andrew Noble, [place]

## 2015-09-24 ENCOUNTER — Ambulatory Visit (INDEPENDENT_AMBULATORY_CARE_PROVIDER_SITE_OTHER): Payer: BLUE CROSS/BLUE SHIELD | Admitting: Medical

## 2015-09-24 ENCOUNTER — Encounter (HOSPITAL_COMMUNITY): Payer: Self-pay | Admitting: Medical

## 2015-09-24 VITALS — BP 130/80 | HR 102 | Ht 67.0 in | Wt 268.0 lb

## 2015-09-24 DIAGNOSIS — F319 Bipolar disorder, unspecified: Secondary | ICD-10-CM

## 2015-09-24 DIAGNOSIS — F418 Other specified anxiety disorders: Secondary | ICD-10-CM | POA: Diagnosis not present

## 2015-09-24 DIAGNOSIS — R748 Abnormal levels of other serum enzymes: Secondary | ICD-10-CM | POA: Diagnosis not present

## 2015-09-24 DIAGNOSIS — E669 Obesity, unspecified: Secondary | ICD-10-CM | POA: Insufficient documentation

## 2015-09-24 MED ORDER — CLONAZEPAM 0.5 MG PO TABS: 0.5000 mg | ORAL_TABLET | Freq: Two times a day (BID) | ORAL | Status: DC

## 2015-09-24 NOTE — Progress Notes (Signed)
[facility] Behavioral Health Follow-up Outpatient Visit  Andrew Noble [birth year]  Date: 06/25/2015   Subjective:Grandfather -"I think he's doing well but he forgets to take his medicine" HPI: Andrew Noble returns for 3 month FU for medication management of his bipolar disorder.His anxiety is controlled with Depakote now and he rarely takes Klonopin.He presents with his Grandfather  today and has moved to a room/apt in [place] close to work. He has troublre remebering to take his medication, He does have a weekly pill pill box that can be separated daily.Pharmacy has changed to [facility]. Review of Systems: Psychiatric: Agitation: No Hallucination: No Depressed Mood: Euthymic/smiling Insomnia: No Hypersomnia: No Altered Concentration: Some  Feels Worthless: No Grandiose Ideas: No Belief In Special Powers: No New/Increased Substance Abuse: No Compulsions: none  Neurologic: Headache: No Seizure: No Paresthesias: No/Tic is gone  Medications: ARIPiprazole (ABILIFY) 5 MG tablet [[id number]]              Dose, Route, Frequency: As Directed     Indications of Use: Manic Phase of Bipolar Mood Disorder    Dispense Quantity:  60 tablet Refills:  2 Fills Remaining:  2            Sig: Take 1 tablet (  total) in the morning and take 1 tablet (  total) at bedtime      Azelastine HCl 0.15 % SOLN [[id number]]              Dose: -- Route: -- Frequency: --    Dispense Quantity:  -- Refills:  -- Fills Remaining:  --            Sig: --           Written Date:  -- Expiration Date:  -- Ordering Date:  01/29/15     Start Date:  01/12/15 End Date:  --      Ordering Provider:  -- Authorizing Provider:  Historical Provider, MD Ordering User:  Melvern Sample, CMA       buPROPion (WELLBUTRIN XL) 300 MG 24 hr tablet [[id number]]              Dose, Route, Frequency: As Directed Order Comments:    PHARMACY PLEASE NOTE Correction of SIG from 01/29/15 RX        Indications of Use:  Depressive Phase Bipolar Mood Disorder    Dispense Quantity:  30 tablet Refills:  2 Fills Remaining:  2            Sig: Take 1 tablet q am           Written Date:  02/05/15 Expiration Date:  02/05/16      Start Date:  02/05/15 End Date:  --        divalproex (DEPAKOTE) 500 MG DR tablet [[id number]]              Dose, Route, Frequency: As Directed     Indications of Use: Manic Phase of Bipolar Mood Disorder    Dispense Quantity:  60 tablet Refills:  2 Fills Remaining:  2            Sig: two tablets each evening at bedtime ( ).      clonazePAM (KLONOPIN) 0.5 MG tablet [[id number]]              Dose: 0.5 mg Route: Oral Frequency: 2 times daily    Dispense Quantity:  60 tablet Refills:  0 Fills Remaining:  0  Past History:  Sig: Take 1 tablet (0.5 mg total) by mouth 2 (two) times daily. Take only As needed for anxietyProgress Notes - in this encounter Danae OrleansPollock, Andrew Earl, MD - 07/17/2015 9:00 AM EDT Formatting of this note may be different from the original. Subjective:   Patient ID: Andrew Noble is a 20 y.o. male.  HPI  Comes in for medical management. #1. Obesity not improving. He has further gained weight. No strong attempts to watch the diet. He now lives by himself in an apartment or a rented room and dietary regulation is up to himself. #2. Abnormal liver function studies likely fatty liver. He has no GI complaints. Hepatitis profile was normal but they did suggest repeating hepatitis A antibodies. This will be repeated along with liver function study today. #3. URI with a slight cough over 3 or 4 days. No fever or chest pain.  Review of Systems Bipolar on meds, He has cost issues with new insurance from his Dad. And talks about the visits don't serve a purpose, just to refill med.   Objective:  Physical Exam Visit Vitals  . BP 120/90  . Pulse 76  . Ht 1.702 m (5\' 7" )  . Wt 120.3 kg (265 lb 3.2 oz)  . BMI 41.54 kg/m2   No acute  distress. Alert and fully oriented. The lungs are clear. Heart rhythm is regular. Abdomen obese and nontender. The liver is nonenlarged. No jaundice. No masses felt Assessment:   1. Abnormal liver enzymes Hepatic Function Panel  Hepatitis A Ab IgG And IgM  LIPID PROFILE  Hepatic Function Panel  Hepatitis A Ab IgG And IgM  LIPID PROFILE    Plan:   This time we did discuss the lab work in detail. He seems to have been not understanding some of the results. He is reassured. Ultrasound maybe recommended next. Repeat lab work today including the antibody and cholesterol. Triglycerides were elevated before in the serum was hyperemic. We did talk about carbohydrate restriction. 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 He will call back if the cough does not clear   Electronically signed by: Danae OrleansNelson Earl Pollock, MD 07/17/2015 5:34 PM  Electronically signed by: Danae OrleansNelson Earl Pollock, MD 07/17/15 1741      US ABDOMEN LIMITED (08/06/2015 9:31 AM) US ABDOMEN LIMITED (08/06/2015 9:31 AM)  Specimen Performing Laboratory   [facility]  [address]  [place] [id number]    US ABDOMEN LIMITED (08/06/2015 9:31 AM)  Narrative  CLINICAL DATA:Elevated liver function studies    EXAM:  US ABDOMEN LIMITED - RIGHT UPPER QUADRANT    COMPARISON:None in PACs    FINDINGS:  Gallbladder:    No gallstones or wall thickening visualized. No sonographic Murphy  sign noted by sonographer.    Common bile duct:    Diameter: 3 mm    Liver:    The hepatic echotexture is increased diffusely with areas of focal  fatty sparing suspected. There is no focal mass or ductal dilation.  The surface contour of the liver is smooth.    IMPRESSION:  Fatty infiltrative change of the liver is suspected. There is no  evidence of gallstones nor other acute hepatobiliary abnormality.      Electronically Signed  By: Linton RumpavidJordan M.D.  On:  08/06/2015 09:34        Filed Vitals:   09/24/15 1014  BP: 130/80  Pulse: 102    Mental Status Examination  Appearance: Well groomed  Alert: Yes Attention: good  Cooperative: Yes Eye Contact: Good Speech: Clear aqnd coherent Psychomotor Activity: Normal Memory/Concentration: Intact Oriented: person, place, time/date and situation Mood: Euthymic Affect: Congruent Thought Processes and Associations: WDL Fund of Knowledge: Fair Thought Content:NO  Suicidal ideation, Homicidal ideation, Auditory hallucinations, Visual hallucinations, Delusions and Paranoia Insight: Limited Judgement: Fair  Assessment:STABLE Bipolar disorder Schizoaffective type controlled with meds.Living semi independently with help/coaching from Moml. Abnormal labs/obesity-? Relationship to meds  Visit Diagnosis  P   ICD-9-CM ICD-10-CM   PL     1.   Bipolar 1 disorder (HCC)    296.7 F31.9        2.   Situational anxiety    300.09 F41.8       3.   Elevated liver enzymes    790.5 R74.8      4.   Obesity    278.00 E66.9         Treatment Plan:Continue current medications;FU 3 months-sooner if needed.Grandfather agrees Follow labs with Dr Julius Bowels for now Ante will put his pill box by his keys in an effort to not forget meds.May take total dose of Abilify in AM but would like him to take Depakote at night to avoid daytime sedation.May need to adjust further .  Maryjean Morn, PA-C

## 2015-10-26 ENCOUNTER — Telehealth (HOSPITAL_COMMUNITY): Payer: Self-pay | Admitting: Psychiatry

## 2015-10-26 NOTE — Telephone Encounter (Signed)
Return telephone call to pt. Pt states he forgot to take medication and restart on Saturday morning. Pt states he is feeling better. Informed pt if he experience any symptoms, he may contact the office for an earlier appt or proceed to local urgent care. Pt verbalizes understanding.

## 2015-12-10 ENCOUNTER — Ambulatory Visit (HOSPITAL_COMMUNITY): Payer: Self-pay | Admitting: Medical

## 2016-01-14 ENCOUNTER — Encounter (HOSPITAL_COMMUNITY): Payer: Self-pay | Admitting: Medical

## 2016-01-14 ENCOUNTER — Ambulatory Visit (INDEPENDENT_AMBULATORY_CARE_PROVIDER_SITE_OTHER): Payer: BLUE CROSS/BLUE SHIELD | Admitting: Medical

## 2016-01-14 VITALS — BP 126/70 | HR 98 | Ht 67.0 in | Wt 274.0 lb

## 2016-01-14 DIAGNOSIS — E669 Obesity, unspecified: Secondary | ICD-10-CM | POA: Diagnosis not present

## 2016-01-14 DIAGNOSIS — F319 Bipolar disorder, unspecified: Secondary | ICD-10-CM | POA: Diagnosis not present

## 2016-01-14 DIAGNOSIS — F418 Other specified anxiety disorders: Secondary | ICD-10-CM

## 2016-01-14 DIAGNOSIS — Z9114 Patient's other noncompliance with medication regimen: Secondary | ICD-10-CM | POA: Diagnosis not present

## 2016-01-14 NOTE — Progress Notes (Signed)
[facility] Follow-up Outpatient Visit  Andrew LuisRobert Noble 1996/04/17  Date: 01/14/16   Subjective : I'm working and driving now..I'm tired all the time" HPI: Andrew MaduroRobert returns for 3 months after last visit and 2 mos after scheduled FU due to forgetfulness over appointments and requesting time off from work per his Mother who acccompanies him today. This FU is for medication management of his bipolar disorder.His anxiety is controlled with Depakote ER now and he stopped Klonopin without filling his RX.H  He has troublre remebering to take his medication, and has only been back to regular use 2 weeks now.He doesnt think he is depressed but his PHQ 9 score is 15. He does have a weekly pill pill box that can be separated daily.Pharmacy has changed to [facility]. He also reports that his father's HSA is depleted so he would like to keep hu is visit costs down.  Visit Diagnosis  P   ICD-9-CM ICD-10-CM   PL     1.   Bipolar 1 disorder (HCC)    296.7 F31.9        2.   Situational anxiety    300.09 F41.8       3.   Elevated liver enzymes    790.5 R74.8      4.   Obesity    278.00 E66.9       Medications   ARIPiprazole (ABILIFY) 15 MG tablet        divalproex (DEPAKOTE) 500 MG DR tablet         buPROPion (WELLBUTRIN XL) 300 MG 24 hr tablet              Past Medical History(Recent) Progress Notes - in this encounter Andrew OrleansPollock, Andrew Earl, MD - 07/17/2015 9:00 AM EDT Formatting of this note may be different from the original. Subjective:   Patient ID: Jossie Ngobert C Real Noble is a 20 y.o. male.  HPI  Comes in for medical management. #1. Obesity not improving. He has further gained weight. No strong attempts to watch the diet. He now lives by himself in an apartment or a rented room and dietary regulation is up to himself. #2. Abnormal liver function studies likely fatty liver. He has no GI complaints. Hepatitis profile was normal but they did suggest repeating hepatitis A  antibodies. This will be repeated along with liver function study today. #3. URI with a slight cough over 3 or 4 days. No fever or chest pain.  Review of Systems Bipolar on meds, He has cost issues with new insurance from his Dad. And talks about the visits don't serve a purpose, just to refill med.   Objective:  Physical Exam Visit Vitals  . BP 120/90  . Pulse 76  . Ht 1.702 m (5\' 7" )  . Wt 120.3 kg (265 lb 3.2 oz)  . BMI 41.54 kg/m2   No acute distress. Alert and fully oriented. The lungs are clear. Heart rhythm is regular. Abdomen obese and nontender. The liver is nonenlarged. No jaundice. No masses felt Assessment:   1. Abnormal liver enzymes Hepatic Function Panel  Hepatitis A Ab IgG And IgM  LIPID PROFILE  Hepatic Function Panel  Hepatitis A Ab IgG And IgM  LIPID PROFILE    Plan:   This time we did discuss the lab work in detail. He seems to have been not understanding some of the results. He is reassured. Ultrasound maybe recommended next. Repeat lab work today including the antibody and cholesterol. Triglycerides were  elevated before in the serum was hyperemic. We did talk about carbohydrate restriction. 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 He will call back if the cough does not clear   Electronically signed by: Andrew Orleans, MD 07/17/2015 5:34 PM  Electronically signed by: Andrew Orleans, MD 07/17/15 1741      US ABDOMEN LIMITED (08/06/2015 9:31 AM) US ABDOMEN LIMITED (08/06/2015 9:31 AM)  Specimen Performing Laboratory   [facility]  [address]  [place], [place] [address]    US ABDOMEN LIMITED (08/06/2015 9:31 AM)  Narrative  CLINICAL DATA:Elevated liver function studies    EXAM:  US ABDOMEN LIMITED - RIGHT UPPER QUADRANT    COMPARISON:None in PACs    FINDINGS:  Gallbladder:    No gallstones or wall thickening visualized. No sonographic Murphy  sign noted by sonographer.    Common bile  duct:    Diameter: 3 mm    Liver:    The hepatic echotexture is increased diffusely with areas of focal  fatty sparing suspected. There is no focal mass or ductal dilation.  The surface contour of the liver is smooth.    IMPRESSION:  Fatty infiltrative change of the liver is suspected. There is no  evidence of gallstones nor other acute hepatobiliary abnormality.      Electronically Signed  By: Linton Rump M.D.  On: 08/06/2015 09:34        Vitals:   01/14/16 1027  BP: 126/70  Pulse: 98   Review of Systems: Psychiatric: Agitation: No Hallucination: No Depressed Mood:PHQ 9 score 14/Affect flat Insomnia: No Hypersomnia: No Altered Concentration: No Feels Worthless: Checks yes on PHQ 9 No thoughts of harm Grandiose Ideas: No Belief In Special Powers: No New/Increased Substance Abuse: No Compulsions: none  Neurologic: Headache: No Seizure: No Paresthesias: No/Tic is gone  Medications:  Mental Status Examination  Appearance: Well groomed Alert: Yes Attention: good  Cooperative: Yes Eye Contact: Good Speech: Clear aqnd coherent Psychomotor Activity: Normal Memory/Concentration: Intact Oriented: person, place, time/date and situation Mood: Euthymic Affect: Congruent Thought Processes and Associations: WDL Fund of Knowledge: Fair Thought Content:NO  Suicidal ideation, Homicidal ideation, Auditory hallucinations, Visual hallucinations, Delusions and Paranoia Insight: Limited Judgement: Fair  Assessment:STABLE Bipolar disorder Schizoaffective type controlled with meds.Living semi independently with help/coaching from Moml. Abnormal labs/obesity-? Relationship to meds     Treatment Plan:Continue current medications except reduce Depakote ER 500mg  to 1 HS.FU in January of 2018 unless sooner if needed.pt agin remindedv to use pill box and place in conspicous location Discussed the chronic nature of his illness and the unlikelyhood he  wouldnt have to take meds for his lifetime in response to his question. Used his experiences when he stops as reinforcement including this recem nt one. Mom agreed  Maryjean Morn, PA-C

## 2016-04-21 ENCOUNTER — Ambulatory Visit (INDEPENDENT_AMBULATORY_CARE_PROVIDER_SITE_OTHER): Payer: BLUE CROSS/BLUE SHIELD | Admitting: Medical

## 2016-04-21 ENCOUNTER — Encounter (HOSPITAL_COMMUNITY): Payer: Self-pay | Admitting: Medical

## 2016-04-21 VITALS — BP 122/74 | HR 96 | Ht 67.0 in | Wt 269.4 lb

## 2016-04-21 DIAGNOSIS — R4589 Other symptoms and signs involving emotional state: Secondary | ICD-10-CM

## 2016-04-21 DIAGNOSIS — F3130 Bipolar disorder, current episode depressed, mild or moderate severity, unspecified: Secondary | ICD-10-CM | POA: Diagnosis not present

## 2016-04-21 DIAGNOSIS — Z818 Family history of other mental and behavioral disorders: Secondary | ICD-10-CM

## 2016-04-21 DIAGNOSIS — F319 Bipolar disorder, unspecified: Secondary | ICD-10-CM | POA: Diagnosis not present

## 2016-04-21 DIAGNOSIS — Z9114 Patient's other noncompliance with medication regimen: Secondary | ICD-10-CM | POA: Diagnosis not present

## 2016-04-21 MED ORDER — OLANZAPINE-FLUOXETINE HCL 6-25 MG PO CAPS: 1.0000 | ORAL_CAPSULE | Freq: Every day | ORAL | 1 refills | Status: DC

## 2016-04-21 NOTE — Progress Notes (Signed)
[facility] Follow-[facility] Outpatient Visit  Darrin LuisRobert Visscher 1996/04/17  Date: 01/14/16   Subjective : I'm working and driving now..I'm tired all the time" HPI: [name] returns for 3 months after last visit and 2 mos after scheduled FU due to forgetfulness over appointments and requesting time off from work per his Mother who acccompanies him today. This FU is for medication management of his bipolar disorder.His anxiety is controlled with Depakote ER now and he stopped Klonopin without filling his RX.H  He has troublre remebering to take his medication, and has only been back to regular use 2 weeks now.He doesnt think he is depressed but his PHQ 9 score is 15. He does have a weekly pill pill box that can be separated daily.Pharmacy has changed to [facility]. He also reports that his father's HSA is depleted so he would like to keep hu is visit costs down.  Visit Diagnosis  P   ICD-9-CM ICD-10-CM   PL     1.   Bipolar 1 disorder (HCC)    296.7 F31.9        2.   Situational anxiety    300.09 F41.8       3.   Elevated liver enzymes    790.5 R74.8      4.   Obesity    278.00 E66.9       Medications   ARIPiprazole (ABILIFY) 15 MG tablet        divalproex (DEPAKOTE) 500 MG DR tablet         buPROPion (WELLBUTRIN XL) 300 MG 24 hr tablet              Past Medical History(Recent) Progress Notes - in this encounter Danae OrleansPollock, Nelson Earl, MD - 07/17/2015 9:00 AM EDT Formatting of this note may be different from the original. Subjective:   Patient ID: [name] is a 21 y.o. male.  HPI  Comes in for medical management. #1. Obesity not improving. He has further gained weight. No strong attempts to watch the diet. He now lives by himself in an apartment or a rented room and dietary regulation is up to himself. #2. Abnormal liver function studies likely fatty liver. He has no GI complaints. Hepatitis profile was normal but they did suggest repeating hepatitis A  antibodies. This will be repeated along with liver function study today. #3. URI with a slight cough over 3 or 4 days. No fever or chest pain.  Review of Systems Bipolar on meds, He has cost issues with new insurance from his Dad. And talks about the visits don't serve a purpose, just to refill med.   Objective:  Physical Exam Visit Vitals  . BP 120/90  . Pulse 76  . Ht 1.702 m (5\' 7" )  . Wt 120.3 kg (265 lb 3.2 oz)  . BMI 41.54 kg/m2   No acute distress. Alert and fully oriented. The lungs are clear. Heart rhythm is regular. Abdomen obese and nontender. The liver is nonenlarged. No jaundice. No masses felt Assessment:   1. Abnormal liver enzymes Hepatic Function Panel  Hepatitis A Ab IgG And IgM  LIPID PROFILE  Hepatic Function Panel  Hepatitis A Ab IgG And IgM  LIPID PROFILE    Plan:   This time we did discuss the lab work in detail. He seems to have been not understanding some of the results. He is reassured. Ultrasound maybe recommended next. Repeat lab work today including the antibody and cholesterol. Triglycerides were  elevated before in the serum was hyperemic. We did talk about carbohydrate restriction. 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 He will call back if the cough does not clear   Electronically signed by: Danae OrleansNelson Earl Pollock, MD 07/17/2015 5:34 PM  Electronically signed by: Danae OrleansNelson Earl Pollock, MD 07/17/15 1741      US ABDOMEN LIMITED (08/06/2015 9:31 AM) US ABDOMEN LIMITED (08/06/2015 9:31 AM)  Specimen Performing Laboratory   [facility]  [address]  [place], [place] [id number]    US ABDOMEN LIMITED (08/06/2015 9:31 AM)  Narrative  CLINICAL DATA:Elevated liver function studies    EXAM:  US ABDOMEN LIMITED - RIGHT UPPER QUADRANT    COMPARISON:None in PACs    FINDINGS:  Gallbladder:    No gallstones or wall thickening visualized. No sonographic Murphy  sign noted by sonographer.    Common bile  duct:    Diameter: 3 mm    Liver:    The hepatic echotexture is increased diffusely with areas of focal  fatty sparing suspected. There is no focal mass or ductal dilation.  The surface contour of the liver is smooth.    IMPRESSION:  Fatty infiltrative change of the liver is suspected. There is no  evidence of gallstones nor other acute hepatobiliary abnormality.      Electronically Signed  By: Linton RumpavidJordan M.D.  On: 08/06/2015 09:34        Vitals:   04/21/16 1024  BP: 122/74  Pulse: 96   Review of Systems: Psychiatric: Agitation: No Hallucination: No Depressed Mood:PHQ 9 score 14/Affect flat Insomnia: No Hypersomnia: No Altered Concentration: No Feels Worthless: Checks yes on PHQ 9 No thoughts of harm Grandiose Ideas: No Belief In Special Powers: No New/Increased Substance Abuse: No Compulsions: none  Neurologic: Headache: No Seizure: No Paresthesias: No/Tic is gone  Medications:  Mental Status Examination  Appearance: Well groomed Alert: Yes Attention: good  Cooperative: Yes Eye Contact: Good Speech: Clear aqnd coherent Psychomotor Activity: Normal Memory/Concentration: Intact Oriented: person, place, time/date and situation Mood: Euthymic Affect: Congruent Thought Processes and Associations: WDL Fund of Knowledge: Fair Thought Content:NO  Suicidal ideation, Homicidal ideation, Auditory hallucinations, Visual hallucinations, Delusions and Paranoia Insight: Limited Judgement: Fair  Assessment:STABLE Bipolar disorder Schizoaffective type controlled with meds.Living semi independently with help/coaching from Moml. Abnormal labs/obesity-? Relationship to meds     Treatment Plan:Start Symbax daily/ Take dose of wellbutrin  PRN if needed fpr 1 week Take 1 dose of depakote HS days today then in 2 days then in 3  days then stop

## 2016-04-21 NOTE — Progress Notes (Signed)
[facility] MD/PA/NP OP Progress Note  04/21/2016 1:39 PM Andrew Noble  MRN:  [id number]   Chief Complaint:  Chief Complaint    Follow-up; Medication Refill; Manic Behavior; Depression; Medication Problem       Subjective : "I havent been taking my medicine.I forget.I dont know why I cant remember.[name].[name].I dont believe I'm Bipolar -I think I'm just depressed and have ADD- I cant concentrate .... The medicine makes  me feel flat/my thinking is foggy.[name].[name].Can I take a different medicine ?[name]...I take the medicine but I dont feel any different. I thought I would get off my medicine and join the service.."  HPI: Andrew MaduroRobert returns with his mother 3 months after last visit and repeats familiiar themes of noncompliant with medications due to forgetfulness;c/o medication side effects around his thinking and feeling/nonacceptance (denial) of his Bipolar DO and unrealistic expectations for his life. His PHQ 9 score today is 21 up from 15 at last visit. All his complaints about medication side effects correspond to symptoms of depression. He continues to work and has his  own apartment. His mom reports hypersomnolence.She shares that she and his father  would call him daily to take his medication but that he became agitated at this so they stopped calling.He has not had counseling in sometime due to cost. And attempting to live independently. He denies being suicidal.  Visit Diagnosis:    ICD-9-CM ICD-10-CM   1. Bipolar 1 disorder (HCC) 296.7 F31.9   2. Noncompliance w/medication treatment due to intermit use of medication V15.81 Z91.14   3. Bipolar disorder with depression (HCC) 296.50 F31.30   4. Denial (mental defense mechanism) 799.29 R45.89     Past Psychiatric History:   Past Medical History:  Past Medical History:  Diagnosis Date  . Abnormal laboratory test 02/12/2014  . Anxiety   . Bipolar disorder (HCC)   . Depression   . Hx of substance abuse 02/12/2014  . Psychosis     Past Surgical History:  Procedure  Laterality Date  . TONSILLECTOMY AND ADENOIDECTOMY      Family Psychiatric History:   Family History:  Family History  Problem Relation Age of Onset  . Depression Mother   . Anxiety disorder Mother   . Bipolar disorder Maternal Aunt     Social History:  Social History   Social History  . Marital status: Single    Spouse name: N/A  . Number of children: N/A  . Years of education: N/A   Social History Main Topics  . Smoking status: Never Smoker  . Smokeless tobacco: Never Used  . Alcohol use No  . Drug use:      Comment: THC last use over 1 1/2 months ago.  [name]. Sexual activity: No   Other Topics Concern  . None   Social History Narrative  . None    Allergies: No Known Allergies  Metabolic Disorder Labs: No results found for: HGBA1C, MPG Lab Results  Component Value Date   PROLACTIN 5.0 04/29/2014   PROLACTIN 46.1 (H) 12/17/2013   No results found for: CHOL, TRIG, HDL, CHOLHDL, VLDL, LDLCALC   Current Medications: Current Outpatient Prescriptions  Medication Sig Dispense Refill  . Azelastine HCl 0.15 % SOLN Reported on 06/25/2015    . OLANZapine-FLUoxetine (SYMBYAX) 6-25 MG capsule Take 1 capsule by mouth daily. 30 capsule 1   No current facility-administered medications for this visit.     Neurologic: Headache: Negative Seizure: Negative Paresthesias: Negative  Musculoskeletal: Strength & Muscle Tone: within normal limits Gait &  Station: normal Patient leans: N/A  Psychiatric Specialty Exam: Review of Systems  Constitutional: Negative for chills, diaphoresis, fever, malaise/fatigue and weight loss.  Gastrointestinal: Negative for abdominal pain, blood in stool, constipation, diarrhea, heartburn, melena, nausea and vomiting.  Musculoskeletal: Negative for back pain, falls, joint pain, myalgias and neck pain.  Skin: Negative for itching and rash.  Neurological: Negative for dizziness, tingling, tremors, sensory change, speech change, focal weakness,  seizures, loss of consciousness, weakness and headaches.  Psychiatric/Behavioral: Positive for depression (PHQ 9 core 21 no SI/HI). Negative for hallucinations, memory loss, substance abuse and suicidal ideas. The patient is nervous/anxious and has insomnia (Hypersomnia).     Blood pressure 122/74, pulse 96, height 5\' 7"  (1.702 m), weight 269 lb 6.4 oz (122.2 kg), SpO2 99 %.Body mass index is 42.19 kg/m.  General Appearance: Neat, Well Groomed and Obese  Eye Contact:  Good  Speech:  Clear and Coherent  Volume:  Normal  Mood:  Dysphoric  Affect:  Congruent and Full Range  Thought Process:  Coherent and Descriptions of Associations: Loose  Orientation:  Full (Time, Place, and Person)  Thought Content: Delusions, Ilusions, Obsessions and Tangential   Suicidal Thoughts:  No  Homicidal Thoughts:  No  Memory:  Negative  Judgement:  Poor  Insight:  Lacking  Psychomotor Activity:  Normal  Concentration:  Concentration: Fair and Attention Span: Fair  Recall:  Fair  Fund of Knowledge: Good  Language: Good  Akathisia:  NA  Handed:  Right  AIMS (if indicated):  NA  Assets:  Desire for Improvement Financial Resources/Insurance Housing Resilience Social Support Talents/Skills Transportation  ADL's:  Intact  Cognition: Impaired,  Mild  Sleep:  Hypersomnia  Lencthgy discussion with patient about compliance ance and his apparent non acceptance of his diagm nosis and treatment. As well as the fact his complaints about medication are all symptoms of depression.Mom helps and is supportive , This was 40 minute visit over 50% spent in counseling/pt education   Treatment Plan Summary: Bipolar/Depression D/C Wellbutin and Abilify and replace with Symbax 6/25 mg QD Taper DepakoteER Seek affordable counselor-possibly at [facility] FU 1 month / sooner if needed   Maryjean Morn, PA-C 04/21/2016, 1:39 PM

## 2016-05-16 ENCOUNTER — Other Ambulatory Visit (HOSPITAL_COMMUNITY): Payer: Self-pay | Admitting: Medical

## 2016-05-19 ENCOUNTER — Encounter (HOSPITAL_COMMUNITY): Payer: Self-pay | Admitting: Medical

## 2016-05-19 ENCOUNTER — Ambulatory Visit (INDEPENDENT_AMBULATORY_CARE_PROVIDER_SITE_OTHER): Payer: BLUE CROSS/BLUE SHIELD | Admitting: Medical

## 2016-05-19 VITALS — BP 116/74 | HR 100 | Resp 16 | Ht 67.0 in | Wt 274.8 lb

## 2016-05-19 DIAGNOSIS — E669 Obesity, unspecified: Secondary | ICD-10-CM | POA: Diagnosis not present

## 2016-05-19 DIAGNOSIS — F3130 Bipolar disorder, current episode depressed, mild or moderate severity, unspecified: Secondary | ICD-10-CM

## 2016-05-19 DIAGNOSIS — F319 Bipolar disorder, unspecified: Secondary | ICD-10-CM

## 2016-05-19 DIAGNOSIS — Z6841 Body Mass Index (BMI) 40.0 and over, adult: Secondary | ICD-10-CM

## 2016-05-19 DIAGNOSIS — Z818 Family history of other mental and behavioral disorders: Secondary | ICD-10-CM

## 2016-05-19 DIAGNOSIS — IMO0001 Reserved for inherently not codable concepts without codable children: Secondary | ICD-10-CM

## 2016-05-19 DIAGNOSIS — Z79899 Other long term (current) drug therapy: Secondary | ICD-10-CM

## 2016-05-19 DIAGNOSIS — Z8659 Personal history of other mental and behavioral disorders: Secondary | ICD-10-CM

## 2016-05-19 DIAGNOSIS — Z9889 Other specified postprocedural states: Secondary | ICD-10-CM

## 2016-05-19 MED ORDER — OLANZAPINE-FLUOXETINE HCL 12-50 MG PO CAPS: 1.0000 | ORAL_CAPSULE | Freq: Every day | ORAL | 1 refills | Status: DC

## 2016-05-19 NOTE — Progress Notes (Addendum)
BH MD/PA/NP OP Progress Note  05/19/2016 4:02 PM Andrew Noble  MRN:  [id number]   Chief Complaint:  Chief Complaint    Follow-up; Manic Behavior; Medication Refill       Subjective : "I feel better.The medication is helping" HPI: Andrew Noble returns with his mother 1 month after last visit and medication change to Symbax due to c/o hypersomnolence and forgetfulness.Mom felt he was like a zombie at times as well. His PHQ 9 score that day was 21 up from 15 at last visit. All his complaints about medication side effects corresponded  to symptoms of depression. He continues to work and has his own apartment.Both he and mother have noted improvement in alertness and mood. His hypersomnolence is unaffected and he shares that "this probably me".He had some trouble initially transitioning to Symbax but this has resolved.He is not quite free of depression and is willing to increase his dose. Pt did not mention visit to PCP(see Additinal history)  Visit Diagnosis:    ICD-9-CM ICD-10-CM   1. Bipolar 1 disorder (HCC) 296.7 F31.9   2. Bipolar disorder with depression (HCC) 296.50 F31.30   3. History of eating disorder V11.8 Z86.59   4. Class 3 obesity without serious comorbidity with body mass index (BMI) of 40.0 to 44.9 in adult, unspecified obesity type (HCC) 278.00 E66.9    V85.41 Z68.41     Past Psychiatric History:   Past Medical History:  Past Medical History:  Diagnosis Date  . Abnormal laboratory test 02/12/2014  . Anxiety   . Bipolar disorder (HCC)   . Depression   . Hx of substance abuse 02/12/2014  . Psychosis     Past Surgical History:  Procedure Laterality Date  . TONSILLECTOMY AND ADENOIDECTOMY      Family Psychiatric History:   Family History:  Family History  Problem Relation Age of Onset  . Depression Mother   . Anxiety disorder Mother   . Bipolar disorder Maternal Aunt     Social History:  Social History   Social History  . Marital status: Single    Spouse name:  N/A  . Number of children: N/A  . Years of education: N/A   Social History Main Topics  . Smoking status: Never Smoker  . Smokeless tobacco: Never Used  . Alcohol use No  . Drug use: Yes     Comment: THC last use over 1 1/2 months ago.  [name] Sexual activity: No   Other Topics Concern  . None   Social History Narrative  . None  Additional History Encounter Details   Date Type Department Care Team Description  04/21/2016 Office Visit [facility]  [address]  [place], [place] [address]  [phone]  Danae Orleans, MD  [address]  [place], [place] [address]  [phone]  [phone] (Fax)  Non-intractable cyclical vomiting with nausea (Primary Dx)    Allergies: No Known Allergies  Metabolic Disorder Labs: No results found for: HGBA1C, MPG Lab Results  Component Value Date   PROLACTIN 5.0 04/29/2014   PROLACTIN 46.1 (H) 12/17/2013   No results found for: CHOL, TRIG, HDL, CHOLHDL, VLDL, LDLCALC   Current Medications: Current Outpatient Prescriptions  Medication Sig Dispense Refill  . Azelastine HCl 0.15 % SOLN Reported on 06/25/2015    . OLANZapine-FLUoxetine (SYMBYAX) 12-50 MG capsule Take 1 capsule by mouth at bedtime. 30 capsule 1   No current facility-administered medications for this visit.     Neurologic: Headache: Negative Seizure: Negative Paresthesias: Negative  Musculoskeletal: Strength & Muscle Tone: within normal limits Gait & Station: normal Patient leans: N/A  Psychiatric Specialty Exam: Review of Systems  Constitutional: Negative for chills, diaphoresis, fever, malaise/fatigue and weight loss.  Gastrointestinal: Positive for nausea and vomiting. Negative for abdominal pain, blood in stool, constipation, diarrhea, heartburn and melena.       Slight elevation of SGPT-see note in Care Everywhere  Musculoskeletal: Negative for back pain, falls, joint pain, myalgias and neck pain.  Skin: Negative for itching  and rash.  Neurological: Negative for dizziness, tingling, tremors, sensory change, speech change, focal weakness, seizures, loss of consciousness, weakness and headaches.  Psychiatric/Behavioral: Positive for depression (PHQ 9 score at last visit 21 no SI/HI). Negative for hallucinations, memory loss, substance abuse and suicidal ideas. The patient is nervous/anxious (improved) and has insomnia (Hypersomnia).     Blood pressure 116/74, pulse 100, resp. rate 16, height 5\' 7"  (1.702 m), weight 274 lb 12.8 oz (124.6 kg), SpO2 95 %.Body mass index is 43.04 kg/m.  General Appearance: Neat, Well Groomed and Obese  Eye Contact:  Good  Speech:  Clear and Coherent  Volume:  Normal  Mood:  Euthymic  Affect:  Congruent and Full Range  Thought Process:  Coherent and Descriptions of Associations: Intact  Orientation:  Full (Time, Place, and Person)  Thought Content: Logical   Suicidal Thoughts:  No  Homicidal Thoughts:  No  Memory:  Negative  Judgement:  Poor  Insight:  Lacking  Psychomotor Activity:  Normal  Concentration:  Concentration: Good and Attention Span: Good  Recall:  Fair  Fund of Knowledge: Fair  Language: Good  Akathisia:  NA  Handed:  Right  AIMS (if indicated):  NA  Assets:  Desire for Improvement Financial Resources/Insurance Housing Resilience Social Support Talents/Skills Transportation  ADL's:  Intact  Cognition: Impaired,  Mild  Sleep:  Hypersomnia  Labs Table of Contents for Results H pylori Antibody (Serum) (04/21/2016 12:24 PM) CBC (04/21/2016 12:24 PM) Lipase (04/21/2016 12:24 PM) Comprehensive Metabolic Panel (04/21/2016 12:24 PM)    H pylori Antibody (Serum) (04/21/2016 12:24 PM) H pylori Antibody (Serum) (04/21/2016 12:24 PM)  Component Value Ref Range  H. PYLORI ANTIBODY Negative Negative   H pylori Antibody (Serum) (04/21/2016 12:24 PM)  Specimen Performing Laboratory  Blood [facility]  [address]  CLIA#  [id number]  [place], [place] [address]   Back to top of Results   CBC (04/21/2016 12:24 PM) CBC (04/21/2016 12:24 PM)  Component Value Ref Range  WBC 8.2 4.8 - 10.8 X 10*3/uL  RBC 5.38 4.70 - 6.10 X 10*6/uL  Hemoglobin 17.4 14.0 - 18.0 G/DL  Hematocrit 95.6 38.7 - 52.0 %  MCV 91.5 80.0 - 94.0 FL  MCH 32.3 (H) 27.0 - 31.0 PG  MCHC 35.3 33.0 - 37.0 G/DL  RDW 56.4 33.2 - 95.1 %  Platelets 251 160 - 360 X 10*3/uL  MPV 9.9 6.8 - 10.2 FL   CBC (04/21/2016 12:24 PM)  Specimen Performing Laboratory  Blood [facility]  [address]  CLIA# [id number]  [place], [place] [address]   Back to top of Results   Lipase (04/21/2016 12:24 PM) Lipase (04/21/2016 12:24 PM)  Component Value Ref Range  Lipase 22 11 - 82 IU/L   Lipase (04/21/2016 12:24 PM)  Specimen Performing Laboratory  Blood [facility]  [address]  CLIA# [id number]  [place], [place] [address]   Back to top of Results   Comprehensive  Metabolic Panel (04/21/2016 12:24 PM) Comprehensive Metabolic Panel (04/21/2016 12:24 PM)  Component Value Ref Range  Sodium 138 135 - 146 MMOL/L  Potassium 4.4 3.5 - 5.3 MMOL/L  Chloride 104 98 - 110 MMOL/L  CO2 26 21 - 33 MMOL/L  BUN 14 8 - 24 MG/DL  Glucose 77 70 - 99 MG/DL  Creatinine 1.610.77 0.960.50 - 1.50 MG/DL  Calcium 04.510.7 (H) 8.5 - 10.5 MG/DL  Total Protein 7.0 6.0 - 8.3 G/DL  Albumin  4.7 3.5 - 5.0 G/DL  Total Bilirubin 0.6 0.1 - 1.2 MG/DL  Alkaline Phosphatase 75 25 - 125 IU/L  AST (SGOT) 39 5 - 40 IU/L  ALT (SGPT) 91 (H) 5 - 50 IU/L  Anion Gap 8 4 - 14 MMOL/L  Est. GFR Non-Black >60 Comment:   GFR is calculated by IDMS traceable MDRD method, and the result is about 5% lower than previously reported results (prior to Mar 29, 2010).   Est. GFR Black >60 Comment:   GFR is calculated by IDMS traceable MDRD method, and the result is about 5% lower than previously reported results (prior to Mar 29, 2010).         Treatment Plan Summary: Bipolar/Depression Increase  Symbax 6/25 mg QD to 12/50 mg Monitor weight-PCP FOLLOWING ALSO [facility] MAY [facility] REFERRAL ?  FU 1 month / sooner if needed   Andrew Mornharles Mosie Angus, PA-C 05/19/2016, 4:02 PM

## 2016-06-06 DIAGNOSIS — R110 Nausea: Secondary | ICD-10-CM | POA: Diagnosis not present

## 2016-06-08 DIAGNOSIS — R7989 Other specified abnormal findings of blood chemistry: Secondary | ICD-10-CM | POA: Diagnosis not present

## 2016-06-08 DIAGNOSIS — R112 Nausea with vomiting, unspecified: Secondary | ICD-10-CM | POA: Diagnosis not present

## 2016-06-16 ENCOUNTER — Encounter (HOSPITAL_COMMUNITY): Payer: Self-pay | Admitting: Medical

## 2016-06-16 ENCOUNTER — Ambulatory Visit (INDEPENDENT_AMBULATORY_CARE_PROVIDER_SITE_OTHER): Payer: BLUE CROSS/BLUE SHIELD | Admitting: Medical

## 2016-06-16 VITALS — BP 128/70 | HR 91 | Resp 16 | Ht 67.0 in | Wt 272.0 lb

## 2016-06-16 DIAGNOSIS — IMO0001 Reserved for inherently not codable concepts without codable children: Secondary | ICD-10-CM

## 2016-06-16 DIAGNOSIS — F3130 Bipolar disorder, current episode depressed, mild or moderate severity, unspecified: Secondary | ICD-10-CM | POA: Diagnosis not present

## 2016-06-16 DIAGNOSIS — Z8659 Personal history of other mental and behavioral disorders: Secondary | ICD-10-CM

## 2016-06-16 DIAGNOSIS — Z6841 Body Mass Index (BMI) 40.0 and over, adult: Secondary | ICD-10-CM

## 2016-06-16 DIAGNOSIS — E669 Obesity, unspecified: Secondary | ICD-10-CM

## 2016-06-16 DIAGNOSIS — F411 Generalized anxiety disorder: Secondary | ICD-10-CM | POA: Diagnosis not present

## 2016-06-16 DIAGNOSIS — F319 Bipolar disorder, unspecified: Secondary | ICD-10-CM

## 2016-06-16 DIAGNOSIS — Z87898 Personal history of other specified conditions: Secondary | ICD-10-CM

## 2016-06-16 DIAGNOSIS — Z79899 Other long term (current) drug therapy: Secondary | ICD-10-CM | POA: Diagnosis not present

## 2016-06-16 DIAGNOSIS — Z818 Family history of other mental and behavioral disorders: Secondary | ICD-10-CM

## 2016-06-16 DIAGNOSIS — F1911 Other psychoactive substance abuse, in remission: Secondary | ICD-10-CM

## 2016-06-16 NOTE — Progress Notes (Signed)
BH MD/PA/NP OP Progress Note  06/16/2016 11:12 AM Andrew Noble  MRN:  [id number]   Chief Complaint:  Chief Complaint    Follow-up; Medication Management; Bipolar 1; Eating disorder hx; Obesity       Subjective : "I feel better.The medication is helping" HPI: AT  LAST VISIT Andrew Noble returned with his mother 1 month after medication change to Symbax due to c/o hypersomnolence and forgetfulness.Mom felt he was like a zombie at times as well. His PHQ 9 score that day was 21 up from 15 at last visit. All his complaints about medication side effects corresponded  to symptoms of depression. He continued to work and keep his own apartment.Both he and mother had noted improvement in alertness and mood.He had some trouble initially transitioning to Symbax but this has resolved.He was  not quite free of depression and was willing to increase his dose. Pt did not mention visit to PCP(see Additinal history) TODAY he reports complete response to increase in Symbiax, Mood and sleep are at goal  agrees,  Visit Diagnosis:    ICD-9-CM ICD-10-CM   1. Medication management V58.69 Z79.899   2. Bipolar disorder with depression (HCC) 296.50 F31.30   3. Generalized anxiety disorder 300.02 F41.1   4. History of eating disorder V11.8 Z86.59   5. Class 3 obesity without serious comorbidity with body mass index (BMI) of 40.0 to 44.9 in adult, unspecified obesity type (HCC) 278.00 E66.9    V85.41 Z68.41   6. Hx of substance abuse V13.89 Z87.898     Past Psychiatric History:   Past Medical History:  Past Medical History:  Diagnosis Date  . Abnormal laboratory test 02/12/2014  . Anxiety   . Bipolar disorder (HCC)   . Depression   . Hx of substance abuse 02/12/2014  . Psychosis     Past Surgical History:  Procedure Laterality Date  . TONSILLECTOMY AND ADENOIDECTOMY      Family Psychiatric History:   Family History:  Family History  Problem Relation Age of Onset  . Depression Mother   . Anxiety  disorder Mother   . Bipolar disorder Maternal Aunt     Social History:  Social History   Social History  . Marital status: Single    Spouse name: N/A  . Number of children: N/A  . Years of education: N/A   Social History Main Topics  . Smoking status: Never Smoker  . Smokeless tobacco: Never Used  . Alcohol use No  . Drug use: Yes     Comment: THC last use over 1 1/2 months ago.  [name] Sexual activity: No   Other Topics Concern  . None   Social History Narrative  . None  Additional History Encounter Details   Date Type Department Care Team Description  04/21/2016 Office Visit [facility]  [address]  [place], [place] [address]  [phone]  Danae Orleans, MD  [address]  [place], [place] [address]  [phone]  [phone] (Fax)  Non-intractable cyclical vomiting with nausea (Primary Dx)    Allergies: No Known Allergies  Metabolic Disorder Labs: No results found for: HGBA1C, MPG Lab Results  Component Value Date   PROLACTIN 5.0 04/29/2014   PROLACTIN 46.1 (H) 12/17/2013   No results found for: CHOL, TRIG, HDL, CHOLHDL, VLDL, LDLCALC   Current Medications: Current Outpatient Prescriptions  Medication Sig Dispense Refill  . OLANZapine-FLUoxetine (SYMBYAX) 12-50 MG capsule Take 1 capsule by mouth at bedtime. 30 capsule 1  . pantoprazole (PROTONIX) 20  MG tablet Take 20 mg by mouth.    . Azelastine HCl 0.15 % SOLN Reported on 06/25/2015     No current facility-administered medications for this visit.     Neurologic: Headache: Negative Seizure: Negative Paresthesias: Negative  Musculoskeletal: Strength & Muscle Tone: within normal limits Gait & Station: normal Patient leans: N/A  Psychiatric Specialty Exam: Review of Systems  Constitutional: Negative for chills, diaphoresis, fever, malaise/fatigue and weight loss.  Gastrointestinal: Positive for nausea and vomiting. Negative for abdominal pain, blood in stool,  constipation, diarrhea, heartburn and melena.       Pt has EGD scheduled and is on Pantaprozole  Musculoskeletal: Negative for back pain, falls, joint pain, myalgias and neck pain.  Skin: Negative for itching and rash.  Neurological: Negative for dizziness, tingling, tremors, sensory change, speech change, focal weakness, seizures, loss of consciousness, weakness and headaches.  Psychiatric/Behavioral: Positive for depression (PHQ 9 score at last visit 21 no SI/HI). Negative for hallucinations, memory loss, substance abuse and suicidal ideas. The patient is nervous/anxious (improved). The patient does not have insomnia (Hypersomnia resolved).     Blood pressure 128/70, pulse 91, resp. rate 16, height 5\' 7"  (1.702 m), weight 272 lb (123.4 kg), SpO2 95 %.Body mass index is 42.6 kg/m.  General Appearance: Neat, Well Groomed and Obese  Eye Contact:  Good  Speech:  Clear and Coherent  Volume:  Normal  Mood:  Euthymic  Affect:  Congruent and Full Range  Thought Process:  Coherent and Descriptions of Associations: Intact  Orientation:  Full (Time, Place, and Person)  Thought Content: Logical   Suicidal Thoughts:  No  Homicidal Thoughts:  No  Memory:  Negative  Judgement:  Poor  Insight:  Lacking  Psychomotor Activity:  Normal  Concentration:  Concentration: Good and Attention Span: Good  Recall:  Fair  Fund of Knowledge: Fair  Language: Good  Akathisia:  NA  Handed:  Right  AIMS (if indicated):  NA  Assets:  Desire for Improvement Financial Resources/Insurance Housing Resilience Social Support Talents/Skills Transportation  ADL's:  Intact  Cognition: Impaired,  Mild  Sleep:  Hypersomnia  Labs Table of Contents for Results H pylori Antibody (Serum) (04/21/2016 12:24 PM) CBC (04/21/2016 12:24 PM) Lipase (04/21/2016 12:24 PM) Comprehensive Metabolic Panel (04/21/2016 12:24 PM)    H pylori Antibody (Serum) (04/21/2016 12:24 PM) H pylori Antibody (Serum) (04/21/2016 12:24 PM)    Component Value Ref Range  H. PYLORI ANTIBODY Negative Negative   H pylori Antibody (Serum) (04/21/2016 12:24 PM)  Specimen Performing Laboratory  Blood [facility]  [address]  CLIA# [id number]  [place], [place] [address]   Back to top of Results   CBC (04/21/2016 12:24 PM) CBC (04/21/2016 12:24 PM)  Component Value Ref Range  WBC 8.2 4.8 - 10.8 X 10*3/uL  RBC 5.38 4.70 - 6.10 X 10*6/uL  Hemoglobin 17.4 14.0 - 18.0 G/DL  Hematocrit 46.9 62.9 - 52.0 %  MCV 91.5 80.0 - 94.0 FL  MCH 32.3 (H) 27.0 - 31.0 PG  MCHC 35.3 33.0 - 37.0 G/DL  RDW 52.8 41.3 - 24.4 %  Platelets 251 160 - 360 X 10*3/uL  MPV 9.9 6.8 - 10.2 FL   CBC (04/21/2016 12:24 PM)  Specimen Performing Laboratory  Blood [facility]  [address]  CLIA# [id number]  [place], [place] [address]   Back to top of Results   Lipase (04/21/2016 12:24 PM) Lipase (04/21/2016 12:24 PM)  Component Value Ref Range  Lipase 22 11 - 82 IU/L   Lipase (04/21/2016 12:24 PM)  Specimen Performing Laboratory  Blood [facility]  [address]  CLIA# [id number]  [place], [place] [id number]   Back to top of Results   Comprehensive Metabolic Panel (04/21/2016 12:24 PM) Comprehensive Metabolic Panel (04/21/2016 12:24 PM)  Component Value Ref Range  Sodium 138 135 - 146 MMOL/L  Potassium 4.4 3.5 - 5.3 MMOL/L  Chloride 104 98 - 110 MMOL/L  CO2 26 21 - 33 MMOL/L  BUN 14 8 - 24 MG/DL  Glucose 77 70 - 99 MG/DL  Creatinine 3.870.77 5.640.50 - 1.50 MG/DL  Calcium 33.210.7 (H) 8.5 - 10.5 MG/DL  Total Protein 7.0 6.0 - 8.3 G/DL  Albumin  4.7 3.5 - 5.0 G/DL  Total Bilirubin 0.6 0.1 - 1.2 MG/DL  Alkaline Phosphatase 75 25 - 125 IU/L  AST (SGOT) 39 5 - 40 IU/L  ALT (SGPT) 91 (H) 5 - 50 IU/L  Anion Gap 8 4 - 14 MMOL/L  Est. GFR Non-Black >60 Comment:   GFR is calculated by IDMS traceable MDRD method, and the result is about 5% lower than  previously reported results (prior to Mar 29, 2010).   Est. GFR Black >60 Comment:   GFR is calculated by IDMS traceable MDRD method, and the result is about 5% lower than previously reported results (prior to Mar 29, 2010).       This was 10 minute visit Treatment Plan Summary: Bipolar/Depression Contrinue Symbax 6/25 mg QD to 12/50 mg Monitor weight-PCP FOLLOWING ALSO [facility]FERRAL  GI FU as scheduled FU 2 months / sooner if needed   Maryjean Mornharles Buren Havey, PA-C 06/16/2016, 11:12 AM

## 2016-06-28 ENCOUNTER — Telehealth (HOSPITAL_COMMUNITY): Payer: Self-pay | Admitting: Medical

## 2016-06-28 DIAGNOSIS — R112 Nausea with vomiting, unspecified: Secondary | ICD-10-CM | POA: Diagnosis not present

## 2016-06-28 DIAGNOSIS — K2950 Unspecified chronic gastritis without bleeding: Secondary | ICD-10-CM | POA: Diagnosis not present

## 2016-06-28 MED ORDER — OLANZAPINE-FLUOXETINE HCL 12-50 MG PO CAPS: 1.0000 | ORAL_CAPSULE | Freq: Every day | ORAL | 1 refills | Status: DC

## 2016-06-28 NOTE — Telephone Encounter (Signed)
Pt left a message up front. Pt needs refills on all of [facility]

## 2016-06-28 NOTE — Telephone Encounter (Signed)
Pt called the office requesting a refill for Symbyax sent to [facility]. Per [name], PA-C, refill request is authorize for Symbyax 12-50mg , #30 w/ 1 refill. Rx was sent to pharmacy. Lvm informing pt of refill status. Pt's next apt is schedule on 08/18/16.

## 2016-06-29 NOTE — Telephone Encounter (Signed)
Pt is schedule for a 2 month f/u apt. Would you like to send an additional month to [facility]?

## 2016-06-29 NOTE — Telephone Encounter (Signed)
No we'll do it then unless insurance requests 3 month rx before

## 2016-06-29 NOTE — Telephone Encounter (Signed)
[facility] is usually 3 month supply ?

## 2016-07-06 ENCOUNTER — Other Ambulatory Visit (HOSPITAL_COMMUNITY): Payer: Self-pay | Admitting: Medical

## 2016-07-06 MED ORDER — OLANZAPINE-FLUOXETINE HCL 12-50 MG PO CAPS: 1.0000 | ORAL_CAPSULE | Freq: Every day | ORAL | 0 refills | Status: DC

## 2016-07-06 NOTE — Telephone Encounter (Signed)
Rx for 90 day supply sent today

## 2016-07-20 ENCOUNTER — Other Ambulatory Visit (HOSPITAL_COMMUNITY): Payer: Self-pay | Admitting: Medical

## 2016-08-08 DIAGNOSIS — Z9114 Patient's other noncompliance with medication regimen: Secondary | ICD-10-CM | POA: Diagnosis not present

## 2016-08-08 DIAGNOSIS — K760 Fatty (change of) liver, not elsewhere classified: Secondary | ICD-10-CM | POA: Diagnosis not present

## 2016-08-08 DIAGNOSIS — R45851 Suicidal ideations: Secondary | ICD-10-CM | POA: Diagnosis not present

## 2016-08-08 DIAGNOSIS — F6381 Intermittent explosive disorder: Secondary | ICD-10-CM | POA: Diagnosis not present

## 2016-08-08 DIAGNOSIS — Z6841 Body Mass Index (BMI) 40.0 and over, adult: Secondary | ICD-10-CM | POA: Diagnosis not present

## 2016-08-08 DIAGNOSIS — F1919 Other psychoactive substance abuse with unspecified psychoactive substance-induced disorder: Secondary | ICD-10-CM | POA: Diagnosis not present

## 2016-08-08 DIAGNOSIS — R451 Restlessness and agitation: Secondary | ICD-10-CM | POA: Diagnosis not present

## 2016-08-08 DIAGNOSIS — F332 Major depressive disorder, recurrent severe without psychotic features: Secondary | ICD-10-CM | POA: Diagnosis not present

## 2016-08-08 DIAGNOSIS — F488 Other specified nonpsychotic mental disorders: Secondary | ICD-10-CM | POA: Diagnosis not present

## 2016-08-08 DIAGNOSIS — Z9119 Patient's noncompliance with other medical treatment and regimen: Secondary | ICD-10-CM | POA: Diagnosis not present

## 2016-08-08 DIAGNOSIS — F419 Anxiety disorder, unspecified: Secondary | ICD-10-CM | POA: Diagnosis not present

## 2016-08-08 DIAGNOSIS — F3132 Bipolar disorder, current episode depressed, moderate: Secondary | ICD-10-CM | POA: Diagnosis not present

## 2016-08-09 DIAGNOSIS — F3132 Bipolar disorder, current episode depressed, moderate: Secondary | ICD-10-CM | POA: Diagnosis not present

## 2016-08-09 DIAGNOSIS — F6381 Intermittent explosive disorder: Secondary | ICD-10-CM | POA: Diagnosis not present

## 2016-08-09 DIAGNOSIS — R45851 Suicidal ideations: Secondary | ICD-10-CM | POA: Diagnosis not present

## 2016-08-09 DIAGNOSIS — Z9114 Patient's other noncompliance with medication regimen: Secondary | ICD-10-CM | POA: Diagnosis not present

## 2016-08-10 DIAGNOSIS — Z6841 Body Mass Index (BMI) 40.0 and over, adult: Secondary | ICD-10-CM | POA: Diagnosis not present

## 2016-08-10 DIAGNOSIS — F332 Major depressive disorder, recurrent severe without psychotic features: Secondary | ICD-10-CM | POA: Diagnosis not present

## 2016-08-11 DIAGNOSIS — F332 Major depressive disorder, recurrent severe without psychotic features: Secondary | ICD-10-CM | POA: Diagnosis not present

## 2016-08-11 DIAGNOSIS — Z6841 Body Mass Index (BMI) 40.0 and over, adult: Secondary | ICD-10-CM | POA: Diagnosis not present

## 2016-08-18 ENCOUNTER — Ambulatory Visit (INDEPENDENT_AMBULATORY_CARE_PROVIDER_SITE_OTHER): Payer: BLUE CROSS/BLUE SHIELD | Admitting: Medical

## 2016-08-18 DIAGNOSIS — Z87898 Personal history of other specified conditions: Secondary | ICD-10-CM

## 2016-08-18 DIAGNOSIS — Z79899 Other long term (current) drug therapy: Secondary | ICD-10-CM | POA: Diagnosis not present

## 2016-08-18 DIAGNOSIS — F3162 Bipolar disorder, current episode mixed, moderate: Secondary | ICD-10-CM | POA: Diagnosis not present

## 2016-08-18 DIAGNOSIS — F411 Generalized anxiety disorder: Secondary | ICD-10-CM

## 2016-08-18 DIAGNOSIS — F1911 Other psychoactive substance abuse, in remission: Secondary | ICD-10-CM

## 2016-08-18 DIAGNOSIS — Z818 Family history of other mental and behavioral disorders: Secondary | ICD-10-CM | POA: Diagnosis not present

## 2016-08-18 DIAGNOSIS — Z8659 Personal history of other mental and behavioral disorders: Secondary | ICD-10-CM | POA: Diagnosis not present

## 2016-08-18 DIAGNOSIS — F3130 Bipolar disorder, current episode depressed, mild or moderate severity, unspecified: Secondary | ICD-10-CM | POA: Diagnosis not present

## 2016-08-18 MED ORDER — PANTOPRAZOLE SODIUM 40 MG PO TBEC: 40.0000 mg | DELAYED_RELEASE_TABLET | Freq: Every day | ORAL | 2 refills | Status: DC

## 2016-08-18 MED ORDER — FLUOXETINE HCL 40 MG PO CAPS: 40.0000 mg | ORAL_CAPSULE | Freq: Every day | ORAL | 2 refills | Status: DC

## 2016-08-18 MED ORDER — TOPIRAMATE 50 MG PO TABS: 50.0000 mg | ORAL_TABLET | Freq: Three times a day (TID) | ORAL | 2 refills | Status: DC

## 2016-08-18 MED ORDER — OLANZAPINE 7.5 MG PO TABS: 7.5000 mg | ORAL_TABLET | Freq: Every day | ORAL | 2 refills | Status: DC

## 2016-08-18 MED ORDER — OLANZAPINE 7.5 MG PO TABS: ORAL_TABLET | ORAL | 2 refills | Status: DC

## 2016-08-18 MED ORDER — FLUOXETINE HCL 40 MG PO CAPS: ORAL_CAPSULE | ORAL | 2 refills | Status: DC

## 2016-08-18 NOTE — Progress Notes (Addendum)
BH MD/PA/NP OP Progress Note  08/18/2016 11:59 AM Andrew Noble  MRN:  [id number]  Chief Complaint:  Subjective: " I like the medicine its working great (referring to Prozac/Topomax) but I'm really aggravated" HPI: Pt returns for 2 month scheduled FU and S/P Hospitalization:His mother and father accompnay him out of concern for son's deteriorating mental staus after having his diagnosis of Bipolar DO since 2012 changed to depression and his bip[olar medication stopped and changed to SSRI only of Prozac with addition of Topomax the patient states he tried to tell provider in hospital of his agitation and returning mania but he says "no one would listen". PT reveals today that prior to his admission he had stopped his medication weeks before AFTER HE CALLED HIS FATHER C?O FATIGUE AND WHO ADVISED HIM TO CALL US. He never did. There is no mention of this on his admission as well.  Visit Diagnosis:    ICD-9-CM ICD-10-CM   1. Bipolar disorder with depression (HCC) 296.50 F31.30   2. Generalized anxiety disorder 300.02 F41.1   3. History of eating disorder V11.8 Z86.59   4. Hx of substance abuse V13.89 Z87.898     Past Psychiatric History: Admitting Information Admitting Service: Psychiatry-Child/Adolescent Admit Date: 07/05/2010 Admitting Diagnosis/Symptoms: Axis I - Eating disorder NOS; Depressive d/o; r/o dellusional disorder Axis II - deferred Axis III - none Axis IV - family, school Axis V - GAF: 21 Briefly, Zay is a 21yo male with no past psychiatric hx who was admitted to the Child and Adolescent Behavioral Health Unit with worsening depression, increasing stress about life issues, changed diet and stress about his appearance. He had lost weight recently, was restricting his diet, exercising more, and was preoccupied with his appaerance and the desire to lsoe weight. Please see H&P from day of admission for more information. Discharge Summary Discharge Service:  Psychiatry-Child/Adolescent Discharge Date: 07/13/2010 Discharge Physician: Yelena Komissarova MD [id number] Provider to Dictate Discharge Summary: Ali MD ([id number]) Primary Discharge Diagnosis: Axis I - Eating disorder NOS; Depressive d/o NOS Axis II - deferred Axis III - none Axis IV - primary support Axis V - GAF: 25  Admitting Information Admitting Service: Psychiatry-Child/Adolescent Admit Date: 11/21/2010 Admitting Diagnosis/Symptoms: mood disorder nos (icd-296.90) Discharge Summary Discharge Service: Psychiatry-Child/Adolescent Discharge Date: 11/24/2010 Discharge Physician: Timothy King MD [id number] Disposition: Home Primary Discharge Diagnosis: mood disorder nos (icd-296.90) Felkel, William Carson, MD - 03/30/2011 3:43 PM EST Formatting of this note may be different from the original. Hospital Course  Jarreau Clifton Scherman was admitted to the Child and Adolescent Behavioral Health unit voluntarily on 03/22/11. Please see admission note from that time. Briefly, Uel is a 21 yo Caucasian male with history of eating disorder, bipolar disorder who was brought to the ED on 03/21/11 by his mother for worsening depression, suicidal ideation and suicidal attempt, in the context of body image preoccupation, family and school conflicts. Voshon is under joint custody from his two parents. He was anorexic in the past and only ate fruits and vegetables. He was admitted to an eating disorder clinic in [place] at that time. More recently he has become interested in weight lifting and will spend a great deal of time looking at men's fitness magazines. The pt's mother and step-father confided that Jaivon is obsessed with his self-image and may spend hours a day standing in front of the mirror to observe his own body. The patient endorsed SIGECAPS symptoms and did not feel safe at home by himself. Mother could not contract for   his safety. He met criteria for inpatient admission for dangerousness and  medication management.  Meds on admission:  - Aripiprazole (Abilify) 2.51m-5mg QHS - Fluoxetine (Prozac) 80 mg po qd  - Trazodone (Desyrel) 50-1061mpo qHS   Medication changes consisted of:  -Decreased prozac from 80 to 6051mue to feeling "foggy," then stopped due to general agreement from patient and family that efficacy was poor. - Changed dose: Aripiprazole (Abilify) 2.5/7.5 mg po qd  - Continued dose: Trazodone (Desyrel) 50 mg po qHS  - Started: Venlafaxine (Effexor-XR) 37.5 mg po qd  In addition the risks, benefits, side effects of Venlafaxine were discussed with the patient's mother/father, and he/she voiced their understanding and provided the informed consent. Specifically, the black box warning of increased suicidality was discussed.   ED Provider Notes - DurBoone. BryGaspar BiddingD - 06/11/2012 11:49 AM EST CHIEF COMPLAINT  Chief Complaint  Patient presents with  . Psychiatric Evaluation  hx of depression, SI, states he was thinking about just not eating.  HPI  RobUmar Patmon a [age]o. male who presents with complaint of depression and suicidal ideation. The patient reports he has been depressed for the last several months. He reports to me a history of eating disorder. He states that he got into an argument with his father last night. The patient is currently 11th grader and spoke to the school guidance counselor today expressing his depression and suicidal thoughts. The patient's plan is to stop eating. He currently lives at home with his dad, stepmom and younger stepbrother and steKenoshae denies any current problems sleeping or eating. His eating problem in the past has been not eating anything and then turning around he went to much. He reports a poor self image regarding his body. In regards to school, is currently feeling 1 class.   Patient is alert, oriented and cooperative. Affect is flat, blunted. He reports future plans of being a firIT trainerMT or a physical therapist. He  states he will go to a hospital if the physician thinks he should "but it never does any good". Family hx is + for depression in mother and her sister. Also some depression on father's side of family. No suicides by blood relatives except a cousin of dad's who died of drug use. Patient denies use of alcohol or drugs. Has smoked MJ "but not for a long time".    03/21/2011 - 03/30/2011 [place] [facility] [id number] admitted to the Child and [facility]oAnthonyit voluntarily on 03/22/11. Please see admission note from that time. Briefly, RobTrysten a [age] Caucasian male with history of eating disorder, bipolar disorder who was brought to the ED on 03/21/11 by his mother for worsening depression, suicidal ideation and suicidal attempt, in the context of body image preoccupation, family and school conflicts. RobLuisfelipe under joint custody from his two parents. He was anorexic in the past and only ate fruits and vegetables. [place]. More recently he has become interested in weight lifting and will spend a great deal of time looking at men's fitness magazines. The pt's mother and step-father confided that RobAsif obsessed with his self-image and may spend hours a day standing in front of the mirror to observe his own body. The patient endorsed SIGECAPS symptoms  and did not feel safe at home by himself. Mother could not contract for his safety. He met criteria for inpatient admission for dangerousness and medication management Meds on admission:  - Aripiprazole (Abilify) 2.13m-5mg QHS - Fluoxetine (Prozac) 80 mg po qd  - Trazodone (Desyrel) 50-1029mpo qHS   Medication changes consisted of:  -Decreased prozac from 80 to 6091mue to feeling "foggy," then stopped due to general agreement  from patient and family that efficacy was poor. - Changed dose: Aripiprazole (Abilify) 2.5/7.5 mg po qd  - Continued dose: Trazodone (Desyrel) 50 mg po qHS  - Started: Venlafaxine (Effexor-XR) 37.5 mg po qd  In addition the risks, benefits, side effects of Venlafaxine were discussed with the patient's mother/father, and he/she voiced their understanding and provided the informed consent. Specifically, the black box warning of increased suicidality was discussed.     [facility]11/2012 [facility]  [address] [phone] involuntary commitment papers on the patient. The urine drug screen is negative. The patient will be referred to an outlying mental health facility that specializes in adolescent psychiatry. FINAL IMPRESSION  1. Depression 2. Suicidal ideation  PT AMBULATED OUT OF ED WITH LAW ENFORCEMENT TO [facility] [place]. PT IN NO ACUTE DISTRESS. GIVEN EMOTIONAL SUPPORT. MelSherlynn StallsN 06/11/12 1941   [facility]06/2013 [facility]  [address] [phone] Provider Notes - Acute, Conversion - 04/06/2013 11:35 AM EST PROGRESS AND PROCEDURES  [facility] okay for [facility]. Pt already has access to outpt psych follow-up. Will discharge pt with a prescription for Depakote 250m48mD..  Labs all benign..  Patient/family counseled.  Disposition: Condition: good.  Discharge decision based on the following: patient's condition is stable; patient's condition is improved.  CLINICAL IMPRESSION  Adjustment disorder with disturbance of conduct.  INSTRUCTIONS  (1. Return to ED immdediately for any sudden worsening/concern).     NOVANiagaracharge Summary Admit date: 10/11/2013 Discharge date: 10/21/2013 Discharge Details  Patient ID: RobeRaymond [age].  10/2June 09, 1997itting Physician: YeleKeane Scrape  Discharge Physician: Dr. KomiLauna Grillcharge Diagnosis  Axis I: Bipolar 1 Axis II: deferred Axis III:  Past Medical History  Diagnosis Date  . Depression  . Anxiety  . Bipolar 1 disorder  . Eating disorder  anorexia  . Body dysmorphic disorder  Axis IV: occupational problems, other psychosocial or environmental problems, problems related to social environment and problems with primary support group  Axis V: GAF: 40  The patient reported that he came to the ED because he felt miserable and was "tired of living like that" which included racing thoughts; constant paranoia that people are going to harm him (there is history of being bullied); that his had racing thoughts; felt miserable and did not feel like his life was worth living, but denied that he had a plan to harm himself. He reported fluctuating levels of energy; problems with sleep and felt that people tired to read his mind. He also was sexually preoccupied and believed that people called him faggot from moving cars.  He said that he [facility], but stopped taking his medications and decompensated; he was unable to tell me for how long he has felt miserable; according to him "it's always been like that" and  he could not identify stressors that may have made him feel worse except that he stopped taking his medications after discharge. He was able to recall Depakote only.  Pt to follow-up with the following provider:  TUESDAY, October 22, 2013 at 8:00am [facility] [address] [place], [place] [address] Ph: [phone] Fx: [phone] (1st appointments with [facility] are considered walk-in appointments) Discharge Medications   START taking these medications  Instructions  hydrOXYzine HCl 50 mg tablet  Dose: 50 mg  For: Anxiety Neurosis  Stop taking on: 10/31/2013   Commonly known as: ATARAX  50 mg, Oral, 3 times a day as needed  risperiDONE 1 MG tablet  Dose: 1 mg  For: Manic-Depression  Stop taking on: 11/20/2013  Commonly known as: RISPERDAL  1 mg, Oral, Daily  risperiDONE 2 MG tablet  Dose: 2 mg  For: Manic-Depression  Stop taking on: 11/20/2013  Commonly known as: RISPERDAL  2 mg, Oral, At bedtime   traZODone 50 MG tablet  Dose: 50 mg  For: Trouble Sleeping  Stop taking on: 11/20/2013  Commonly known as: DESYREL  50 mg, Oral, At bedtime as needed  CHANGE how you take these medications  Instructions  divalproex sodium 500 mg EC tablet  Dose: 500 mg  For: Manic Phase of Manic-Depression  Stop taking on: 10/21/2014  Commonly known as: DEPAKOTE DR  What changed:  - medication strength - how much to take - when to take this  500 mg, Oral, 3 times a day    08/08/2016 - 08/11/2016 Hospital Encounter [facility]HAVIORAL HEALTH [facility]  [address]  [place], [place] [address]  [phone]   Hospital Discharge Summary  Subjective:  Caidon is 20 years of age this was his first admission here there were concerns about safety he had a history of being diagnosed with bipolar disorder but I question that because it was based solely on a history of racing thoughts with no other symptoms and he had been on an antipsychotic which I thought was contributing to weight gain and was not operatively helpful for him once here he was pleasant cooperative displayed no dangerous behaviors and contract fully at the point of discharge he underwent med adjustments listed below and was stable for least by the morning the 26th no thoughts of harming self or others no side effects and showing improvement in mood and affect Boursiquot, Koji C  Home Medication Instructions HAR:[id number]  Printed on:08/11/16 1041   Medication Information  FLUoxetine (PROZAC) 40 MG capsule Take 1 capsule (40 mg total) by mouth daily. iron-FA-dha-epa-FAD-NADH-be-mv (ENLYTE) 1.5 mg iron-  8.73 mg capsule DR Take 1 capsule by mouth daily. If not on insurance go to [email or web address], to order pantoprazole (PROTONIX) 20 MG tablet TAKE 1 TABLET (20 MG TOTAL) BY MOUTH 2 TIMES DAILY FOR 30 DAYS. topiramate (TOPAMAX) 50 MG tablet Take 1 tablet (50 mg total) by mouth Three (3) times a day. Assessment/Plan:  Axis I Depression recurrent severe without psychosis Axis II defer Axis III obesity Andrew Farah MD DFAPA  Pt has been seen on outpatient basis after each hospitalization He has been seen at [facility] [place] from 04/07/2011 -03/16/2012 and 11/13/2013 to Present  Past Medical History:  Past Medical History:  Diagnosis Date  . Abnormal laboratory test 02/12/2014  . Anxiety   . Bipolar disorder (HCC)   . Depression   . Hx of substance abuse 02/12/2014  . Psychosis       Past Surgical History:  Procedure Laterality Date  . TONSILLECTOMY AND ADENOIDECTOMY     Family Psychiatric History: Depression on both sides of family  Family History:  Family History  Problem Relation Age of Onset  . Depression Mother   . Anxiety disorder Mother   . Bipolar disorder Maternal Aunt    Social History:  Social History   Social History  . Marital status: Single    Spouse name: N/A  . Number of children: N/A  . Years of education: N/A   Social History Main Topics  . Smoking status: Never Smoker  . Smokeless tobacco: Never Used  . Alcohol use No  . Drug use: Yes     Comment: THC last use over 1 1/2 months ago.  [name] Sexual activity: No   Other Topics Concern  . Not on file   Social History Narrative  . No narrative on file    Allergies: No Known Allergies  Metabolic Disorder Labs: No results found for: HGBA1C, MPG Lab Results  Component Value Date   PROLACTIN 5.0 04/29/2014   PROLACTIN 46.1 (H) 12/17/2013   No results found for: CHOL, TRIG, HDL, CHOLHDL, VLDL, LDLCALC   Current Medications: Current Outpatient Prescriptions  Medication Sig Dispense  Refill  . Dietary Management Product (ENLYTE PO) Take by mouth.    [name] FLUoxetine (PROZAC) 40 MG capsule Take 1 hr before bedtime 30 capsule 2  . pantoprazole (PROTONIX) 20 MG tablet TAKE 1 TABLET (20 MG TOTAL) BY MOUTH 2 TIMES DAILY FOR 30 DAYS.    [name] topiramate (TOPAMAX) 50 MG tablet Take 1 tablet (50 mg total) by mouth 3 (three) times daily. 90 tablet 2  . Azelastine HCl 0.15 % SOLN Reported on 06/25/2015    . OLANZapine (ZYPREXA) 7.5 MG tablet Take 1 hr before bedtime 30 tablet 2  . pantoprazole (PROTONIX) 40 MG tablet Take 1 tablet (40 mg total) by mouth at bedtime. 1 hr after psych meds 30 tablet 2   No current facility-administered medications for this visit.     Neurologic: Headache: Negative Seizure: Negative Paresthesias: Negative  Musculoskeletal: Strength & Muscle Tone: within normal limits Gait & Station: normal Patient leans: N/A  Psychiatric Specialty Exam: Review of Systems  Constitutional: Positive for malaise/fatigue (Chronic complaint on and off meds.Stops meds when he decides medication is making him tired). Negative for chills, diaphoresis, fever and weight loss.  HENT: Negative for congestion, ear discharge, ear pain, hearing loss, nosebleeds, sinus pain, sore throat and tinnitus.   Eyes: Negative for blurred vision, double vision, photophobia, pain, discharge and redness.  Respiratory: Negative for cough, hemoptysis, sputum production, shortness of breath, wheezing and stridor.   Cardiovascular: Negative for chest pain, palpitations, orthopnea, claudication, leg swelling and PND.  Gastrointestinal: Positive for nausea (am on awakening) and vomiting (some with nausea). Negative for abdominal pain, blood in stool, constipation, diarrhea and melena.  Genitourinary: Negative for dysuria, flank pain, frequency, hematuria and urgency.  Musculoskeletal: Negative for back pain, falls, joint pain, myalgias and neck pain.  Skin: Negative for itching and rash.  Neurological:  Negative for dizziness, tingling, tremors, sensory change, speech change, focal weakness, seizures, loss of consciousness, weakness and headaches.  Endo/Heme/Allergies: Negative for environmental allergies and polydipsia. Does not bruise/bleed easily.  Psychiatric/Behavioral: Positive for depression (MDQ positive 15/15 Serious problem after having diagnosius and meds changed at hospital) and substance abuse (Past hx- remarks he refuses to return to use to deal with current symptoms). Negative for hallucinations, memory loss and suicidal  ideas. The patient is nervous/anxious (Extremely irriratabel/hyperactive) and has insomnia.     There were no vitals taken for this visit.There is no height or weight on file to calculate BMI.  General Appearance: Well Groomed and tense  Eye Contact:  Good  Speech:  Clear and Coherent  Volume:  Normal  Mood:  Dysphoric Agitated-getting worse per Dad  Affect:  Congruent and Flat  Thought Process:  Coherent, Goal Directed and Descriptions of Associations: Intact  Orientation:  Full (Time, Place, and Person)  Thought Content: Logical, Obsessions and Rumination Racing intolerably  Suicidal Thoughts:  No  Homicidal Thoughts:  No  Memory:  Negative  Judgement:  Fair  Insight:  Fair  Psychomotor Activity:  Restlessness  Concentration:  Concentration: intact for visit and Attention Span: intact for visit  Recall:  Good  Fund of Knowledge: Fair  Language: Fair  Akathisia:  NA  Handed:  Right  AIMS (if indicated):  NA  Assets:  Desire for Improvement Financial Resources/Insurance Housing Physical Health Resilience Social Support Transportation Vocational/Educational  ADL's:  Intact  Cognition: WNL  Sleep:  Impaired     Treatment Plan Summary: After discussing pts MDQ and current mental status with him and his parents he was place back on a nonfixed combination of Zyprexa and Prozac to address his c/o chronic fatigue and ? Of contribution his  medications make. Topomax will continue as he feels a benefit .His parents are very supportive and caring and will stay close to him for now They agree he should return in 1 month-sooner if needed.    , PA-C 08/18/2016, 11:59 AM 

## 2016-08-21 ENCOUNTER — Encounter (HOSPITAL_COMMUNITY): Payer: Self-pay | Admitting: Medical

## 2016-09-22 ENCOUNTER — Ambulatory Visit (INDEPENDENT_AMBULATORY_CARE_PROVIDER_SITE_OTHER): Payer: BLUE CROSS/BLUE SHIELD | Admitting: Licensed Clinical Social Worker

## 2016-09-22 DIAGNOSIS — Z8659 Personal history of other mental and behavioral disorders: Secondary | ICD-10-CM | POA: Diagnosis not present

## 2016-09-22 DIAGNOSIS — F319 Bipolar disorder, unspecified: Secondary | ICD-10-CM

## 2016-09-22 DIAGNOSIS — F411 Generalized anxiety disorder: Secondary | ICD-10-CM | POA: Diagnosis not present

## 2016-09-23 NOTE — Progress Notes (Signed)
Comprehensive Clinical Assessment (CCA) Note  09/23/2016 Andrew Noble [id number]  Visit Diagnosis:      ICD-10-CM   1. Bipolar 1 disorder (HCC) F31.9   2. Generalized anxiety disorder F41.1   3. History of eating disorder Z86.59       CCA Part One  Part One has been completed on paper by the patient.  (See scanned document in Chart Review)  CCA Part Two A  Intake/Chief Complaint:  CCA Intake With Chief Complaint CCA Part Two Date: 09/22/16 CCA Part Two Time: 1004 Chief Complaint/Presenting Problem: Therapy was recommended when he was hospitalized in April.  At that time he had suicidal ideation and severe anxiety.   Patients Currently Reported Symptoms/Problems: Has episodes of panic.  Has mood swings.  Concerned about how he has gained a lot of weight.  Tends to overeat.  Gets free food at his job at a [employer].  Reports "I sleep a lot"  Individual's Strengths: Good problem solver.  Pretty resourceful.  "I'm honest."  Has a lot of empathy for others.  Good relationship with his parents and paternal grandparents.  Grandfather helped him get his job at [employer].  "I love my job."   Individual's Preferences: "I figured it would be good to talk to somebody.  I need some advice on how to deal with some of the things I deal with."   Type of Services Patient Feels Are Needed: Therapy and medication management Initial Clinical Notes/Concerns: Has a history of many hospitalizations starting around age 74.  At one point he attended a two week program for individuals with eating disorders.  Patient lives alone.    Mental Health Symptoms Depression:  Depression: Worthlessness, Weight gain/loss, Sleep (too much or little), Irritability, Increase/decrease in appetite, Hopelessness, Difficulty Concentrating, Fatigue, Change in energy/activity  Mania:  Mania: Euphoria, Increased Energy, Change in energy/activity, Overconfidence, Racing thoughts, Irritability, Recklessness  Anxiety:    Anxiety: Worrying, Tension, Sleep, Restlessness, Irritability, Fatigue, Difficulty concentrating  Psychosis:  Psychosis: N/A  Trauma:  Trauma: N/A  Obsessions:  Obsessions: N/A  Compulsions:  Compulsions: N/A  Inattention:     Hyperactivity/Impulsivity:     Oppositional/Defiant Behaviors:     Borderline Personality:     Other Mood/Personality Symptoms:      Mental Status Exam Appearance and self-care  Stature:  Stature: Average  Weight:  Weight: Obese  Clothing:  Clothing: Disheveled  Grooming:  Grooming: Normal  Cosmetic use:  Cosmetic Use: None  Posture/gait:  Posture/Gait: Slumped  Motor activity:  Motor Activity: Not Remarkable  Sensorium  Attention:  Attention: Normal  Concentration:  Concentration: Normal  Orientation:  Orientation: X5  Recall/memory:  Recall/Memory: Normal  Affect and Mood  Affect:  Affect: Anxious  Mood:  Mood: Anxious, Depressed  Relating  Eye contact:  Eye Contact: Fleeting  Facial expression:  Facial Expression: Anxious  Attitude toward examiner:  Attitude Toward Examiner: Cooperative  Thought and Language  Speech flow: Speech Flow: Paucity  Thought content:     Preoccupation:     Hallucinations:     Organization:     Company secretary of Knowledge:  Fund of Knowledge: Average  Intelligence:  Intelligence: Average  Abstraction:  Abstraction: Normal  Judgement:  Judgement: Fair  Dance movement psychotherapist:  Reality Testing: Adequate  Insight:  Insight: Fair  Decision Making:  Decision Making: Impulsive  Social Functioning  Social Maturity:  Social Maturity: Responsible  Social Judgement:  Social Judgement: Normal  Stress  Stressors:  Stressors:  (Has  a lot of medical bills. )  Coping Ability:  Coping Ability: Overwhelmed, Exhausted  Skill Deficits:     Supports:      Family and Psychosocial History: Family history Marital status: Single Does patient have children?: No  Childhood History:  Childhood History By whom was/is the  patient raised?: Mother/father and step-parent Additional childhood history information: Parents divorced when he was about 30.  Went back and forth between parents.  Both parents remarried when patient was 88.   Description of patient's relationship with caregiver when they were a child: Enjoyed spending time with his mom.  He and his dad were close before he remarried.     Patient's description of current relationship with people who raised him/her: Mom helps budget his money.  They visit and talk on the phone.  She lives in [place].  "We're pretty close."  NOtes that he doesn't get along with his step-dad.  Dad "I see him every now and then.  He lives on [place]."  They talk on the phone 1-2 times a month.  Reports "My step-mom doesn't like me." How were you disciplined when you got in trouble as a child/adolescent?: I'd get grounded.  Have privileges taken away. Does patient have siblings?: Yes Description of patient's current relationship with siblings: Half-sister, Lyla Son (25)- "We haven't heard from her since she went to live with her biological mom."  Not close with any of his stepsiblings.   Did patient suffer any verbal/emotional/physical/sexual abuse as a child?: No Did patient suffer from severe childhood neglect?: No Has patient ever been sexually abused/assaulted/raped as an adolescent or adult?: No Was the patient ever a victim of a crime or a disaster?: No Witnessed domestic violence?: No Has patient been effected by domestic violence as an adult?: No  CCA Part Two B  Employment/Work Situation: Employment / Work Psychologist, occupational Employment situation: Employed Where is patient currently employed?: [employer] How long has patient been employed?: 2.5 years Patient's job has been impacted by current illness: No Has patient ever been in the [employer]?: No  Education: Education Did Garment/textile technologist From [employer]?: Yes Did You Have Any Difficulty At [employer]?:  No  Religion: Religion/Spirituality Are You A Religious Person?:  ("There's days that I am and days that I'm not.")  Leisure/Recreation: Leisure / Recreation Leisure and Hobbies: Sleeps a lot, watches movies. collects movies, play videogames, hang out with friends  Exercise/Diet: Exercise/Diet Do You Exercise?: No Have You Gained or Lost A Significant Amount of Weight in the Past Six Months?: Yes-Gained Do You Follow a Special Diet?: No Do You Have Any Trouble Sleeping?: No  CCA Part Two C  Alcohol/Drug Use: Alcohol / Drug Use Prescriptions: Takes as prescribed History of alcohol / drug use?:  (Admits to experimenting with marijuana and cocaine at age 49.  Reports "At the time I lived with some pretty bad people.  They dealt drugs."  Lived with them for about a year.)                      CCA Part Three  ASAM's:  Six Dimensions of Multidimensional Assessment  Dimension 1:  Acute Intoxication and/or Withdrawal Potential:     Dimension 2:  Biomedical Conditions and Complications:     Dimension 3:  Emotional, Behavioral, or Cognitive Conditions and Complications:     Dimension 4:  Readiness to Change:     Dimension 5:  Relapse, Continued use, or Continued Problem Potential:  Dimension 6:  Recovery/Living Environment:      Substance use Disorder (SUD)    Social Function:  Social Functioning Social Maturity: Responsible Social Judgement: Normal  Stress:  Stress Stressors:  (Has a lot of medical bills. ) Coping Ability: Overwhelmed, Exhausted Patient Takes Medications The Way The Doctor Instructed?:  (More consistent about taking his meds in the past month)  Risk Assessment- Self-Harm Potential: Risk Assessment For Self-Harm Potential Thoughts of Self-Harm: Vague current thoughts Method: No plan Additional Information for Self-Harm Potential: Acts of Self-harm (Admits to history of head banging)  Risk Assessment -Dangerous to Others Potential: Risk  Assessment For Dangerous to Others Potential Method: No Plan Additional Comments for Danger to Others Potential: Denies history of harm to others  DSM5 Diagnoses: Patient Active Problem List   Diagnosis Date Noted  . Elevated liver enzymes 09/24/2015  . Obesity 09/24/2015  . Generalized anxiety disorder 03/05/2015  . Bipolar disorder with depression (HCC)   . Bipolar 1 disorder (HCC) 10/11/2013  . Mood disorder (HCC) 05/20/2011  . History of eating disorder 05/20/2011     Recommendations for Services/Supports/Treatments: Recommendations for Services/Supports/Treatments Recommendations For Services/Supports/Treatments: Individual Therapy, Medication Management    Marilu FavreSolomon, Carely Nappier A

## 2016-09-29 ENCOUNTER — Encounter (HOSPITAL_COMMUNITY): Payer: Self-pay | Admitting: Medical

## 2016-09-29 ENCOUNTER — Ambulatory Visit (INDEPENDENT_AMBULATORY_CARE_PROVIDER_SITE_OTHER): Payer: BLUE CROSS/BLUE SHIELD | Admitting: Medical

## 2016-09-29 VITALS — BP 126/70 | Resp 16 | Ht 67.0 in | Wt 280.0 lb

## 2016-09-29 DIAGNOSIS — F319 Bipolar disorder, unspecified: Secondary | ICD-10-CM

## 2016-09-29 DIAGNOSIS — F1911 Other psychoactive substance abuse, in remission: Secondary | ICD-10-CM

## 2016-09-29 DIAGNOSIS — Z8659 Personal history of other mental and behavioral disorders: Secondary | ICD-10-CM | POA: Diagnosis not present

## 2016-09-29 DIAGNOSIS — E669 Obesity, unspecified: Secondary | ICD-10-CM | POA: Diagnosis not present

## 2016-09-29 DIAGNOSIS — F418 Other specified anxiety disorders: Secondary | ICD-10-CM

## 2016-09-29 DIAGNOSIS — IMO0001 Reserved for inherently not codable concepts without codable children: Secondary | ICD-10-CM

## 2016-09-29 DIAGNOSIS — Z6841 Body Mass Index (BMI) 40.0 and over, adult: Secondary | ICD-10-CM | POA: Diagnosis not present

## 2016-09-29 DIAGNOSIS — Z87898 Personal history of other specified conditions: Secondary | ICD-10-CM | POA: Diagnosis not present

## 2016-09-29 MED ORDER — OLANZAPINE 7.5 MG PO TABS: ORAL_TABLET | ORAL | 2 refills | Status: DC

## 2016-09-29 MED ORDER — TOPIRAMATE 100 MG PO TABS: 100.0000 mg | ORAL_TABLET | Freq: Every day | ORAL | 0 refills | Status: DC

## 2016-09-29 MED ORDER — FLUOXETINE HCL 40 MG PO CAPS: 40.0000 mg | ORAL_CAPSULE | Freq: Every day | ORAL | 2 refills | Status: DC

## 2016-09-29 NOTE — Progress Notes (Signed)
Subjective:' I'm tired" HPI: At last visit  " I like the medicine its working great (referring to Prozac/Topomax) but I'm really aggravated"  Pt returned for 2 month scheduled FU and S/P Hospitalization:His mother and father accompnay him out of concern for son's deteriorating mental staus after having his diagnosis of Bipolar DO since 2012 changed to depression and his bip[olar medication stopped and changed to SSRI only of Prozac with addition of Topomax Heath Lark states he tried to tell provider in hospital of his agitation and returning mania but he says "no one would listen". PT reveals today that prior to his admission he had stopped his medication weeks before AFTER HE CALLED HIS FATHER C?O FATIGUE AND WHO ADVISED HIM TO CALL us. He never did. There is no mention of this on his admission as well. TODAY Heath Lark is upset over incident at work. A customer he befriended came to his boss to talk about Heath Lark.He has spoken to her about situation but it has triggered old patterns of obsessive thinking about people and their sufferings that he wants to fix but knows he cant. He also admits to missing doses of meds at night when he has arranged to take them so he wont forget. He has met with Counselor once before the incident and only for CCA he has FU Appt later this month. He is again uncertain about his medications but allows that he hasnt been on them long enough to know how effective they are.When asked about any medications he has taken that might have bee effective he says they "made me sleep all the time". Reviewed a typical day with patient. He is not getting any physical exercise.Discussed at home ideas since he didn t use his last [employer].  Visit Diagnosis:    ICD-9-CM ICD-10-CM   1. Bipolar disorder with depression ([place]) 296.50 F31.30   2. Generalized anxiety disorder 300.02 F41.1   3. History of eating disorder V11.8 Z86.59   4. Hx of substance abuse V13.89 Z87.898    Past  Psychiatric History: Admitting Information Admitting Service: Psychiatry-Child/Adolescent Admit Date: 07/05/2010 Admitting Diagnosis/Symptoms: Axis I - Eating disorder NOS; Depressive d/o; r/o dellusional disorder Axis II - deferred Axis III - none Axis IV - family, school Axis V - GAF: 63 Briefly, Fed is a 21yo male with no past psychiatric hx who was admitted to the [facility] with worsening depression, increasing stress about life issues, changed diet and stress about his appearance. He had lost weight recently, was restricting his diet, exercising more, and was preoccupied with his appaerance and the desire to lsoe weight. Please see H&P from day of admission for more information. Discharge Summary Discharge Service: Psychiatry-Child/Adolescent Discharge Date: 07/13/2010 Discharge Physician: Hiram Gash MD [phone] Provider to [facility] Discharge Summary: [name] ([id number]) Primary Discharge Diagnosis: Axis I - Eating disorder NOS; Depressive d/o NOS Axis II - deferred Axis III - none Axis IV - primary support Axis V - GAF: 25  Admitting Information Admitting Service: Psychiatry-Child/Adolescent Admit Date: 11/21/2010 Admitting Diagnosis/Symptoms: mood disorder nos (icd-296.90) Discharge Summary Discharge Service: Psychiatry-Child/Adolescent Discharge Date: 11/24/2010 Discharge Physician: Nash Shearer MD [id number] Disposition: Home Primary Discharge Diagnosis: mood disorder nos (icd-296.90) Adline Peals, MD - 03/30/2011 3:43 PM EST Formatting of this note may be different from the original. Bainbridge was admitted to the Child and [place] unit voluntarily on 03/22/11. Please see admission note from that time. Briefly, Zackery is a 21 yo  Caucasian male with history of eating disorder, bipolar disorder who was brought to the ED on 03/21/11 by his mother for worsening depression, suicidal  ideation and suicidal attempt, in the context of body image preoccupation, family and school conflicts. Emori is under joint custody from his two parents. He was anorexic in the past and only ate fruits and vegetables. He was admitted to an eating disorder clinic in [place] at that time. More recently he has become interested in weight lifting and will spend a great deal of time looking at men's fitness magazines. The pt's mother and step-father confided that Tilton is obsessed with his self-image and may spend hours a day standing in front of the mirror to observe his own body. The patient endorsed SIGECAPS symptoms and did not feel safe at home by himself. Mother could not contract for his safety. He met criteria for inpatient admission for dangerousness and medication management.  Meds on admission:                                                                               - Aripiprazole (Abilify) 2.90m-5mg QHS - Fluoxetine (Prozac) 80 mg po qd  - Trazodone (Desyrel) 50-1066mpo qHS   Medication changes consisted of:  -Decreased prozac from 80 to 6028mue to feeling "foggy," then stopped due to general agreement from patient and family that efficacy was poor. - Changed dose: Aripiprazole (Abilify) 2.5/7.5 mg po qd  - Continued dose: Trazodone (Desyrel) 50 mg po qHS  - Started: Venlafaxine (Effexor-XR) 37.5 mg po qd  In addition the risks, benefits, side effects of Venlafaxine were discussed with the patient's mother/father, and he/she voiced their understanding and provided the informed consent. Specifically, the black box warning of increased suicidality was discussed.     [facility]11[facility]  [id number] [phone] involuntary commitment papers on the patient. The urine drug screen is negative. The patient will be referred to an outlying mental health facility that specializes in adolescent  psychiatry. FINAL IMPRESSION  1. Depression 2. Suicidal ideation PT AMBULATED OUT OF ED WITH LAW ENFORCEMENT TO [facility] [place]. PT IN NO ACUTE DISTRESS. GIVEN EMOTIONAL SUPPORT. MelSherlynn StallsN 06/11/12 1941   04/06/2013 [facility]  [id number] [place] [id number] Provider Notes - Acute, Conversion - 04/06/2013 11:35 AM EST PROGRESS AND PROCEDURES  [facility] okay for [facility] to home. Pt already has access to outpt psych follow-up. Will discharge pt with a prescription for Depakote 250m35mD..  Labs all benign..  Patient/family counseled.  Disposition: Condition: good.  Discharge decision based on the following: patient's condition is stable; patient's condition is improved.  CLINICAL IMPRESSION  Adjustment disorder with disturbance of conduct.  INSTRUCTIONS  (1. Return to ED immdediately for any sudden worsening/concern).     [place] Admit date: 10/11/2013 Discharge date: 10/21/2013 Discharge Details  Patient ID: RobeDaxten 65. 10/209-Sep-1997itting Physician: YeleKeane Scrape  Discharge Physician: Dr. KomiLauna Grillcharge Diagnosis  Axis I: Bipolar 1  Axis II: deferred Axis III:  Past Medical History  Diagnosis Date  . Depression  . Anxiety  . Bipolar 1 disorder  . Eating disorder  anorexia  . Body dysmorphic disorder  Axis IV: occupational problems, other psychosocial or environmental problems, problems related to social environment and problems with primary support group  Axis V: GAF: 40  The patient reported that he came to the ED because he felt miserable and was "tired of living like that" which included racing thoughts; constant paranoia that people are going to harm him (there is history of being bullied); that his had racing thoughts; felt miserable and did not feel like his life was  worth living, but denied that he had a plan to harm himself. He reported fluctuating levels of energy; problems with sleep and felt that people tired to read his mind. He also was sexually preoccupied and believed that people called him faggot from moving cars.  He said that he last was treated for bipolar disorder in December 2014 at [facility] and did well, but stopped taking his medications and decompensated; he was unable to tell me for how long he has felt miserable; according to him "it's always been like that" and he could not identify stressors that may have made him feel worse except that he stopped taking his medications after discharge. He was able to recall Depakote only.  Pt to follow-up with the following provider:  TUESDAY, October 22, 2013 at [facility] [address], [place] [address] Ph: [phone] Fx: [phone] (1st appointments with [facility] are considered walk-in appointments) Discharge Medications   START taking these medications  Instructions  hydrOXYzine HCl 50 mg tablet  Dose: 50 mg  For: Anxiety Neurosis  Stop taking on: 10/31/2013  Commonly known as: ATARAX  50 mg, Oral, 3 times a day as needed  risperiDONE 1 MG tablet  Dose: 1 mg  For: Manic-Depression  Stop taking on: 11/20/2013  Commonly known as: RISPERDAL  1 mg, Oral, Daily  risperiDONE 2 MG tablet  Dose: 2 mg  For: Manic-Depression  Stop taking on: 11/20/2013  Commonly known as: RISPERDAL  2 mg, Oral, At bedtime   traZODone 50 MG tablet  Dose: 50 mg  For: Trouble Sleeping  Stop taking on: 11/20/2013  Commonly known as: DESYREL  50 mg, Oral, At bedtime as needed  CHANGE how you take these medications  Instructions  divalproex sodium 500 mg EC tablet  Dose: 500 mg  For: Manic Phase of Manic-Depression  Stop taking on: 10/21/2014  Commonly known as: DEPAKOTE DR  What changed:  - medication strength - how much to take - when to take this  500 mg,  Oral, 3 times a day    08/08/2016 - 08/11/2016 [place]  [place]  [place], [place] [phone]  [facility] Discharge Summary  Subjective:  Chistian is 21 years of age this was his first admission here there were concerns about safety he had a history of being diagnosed with bipolar disorder but I question that because it was based solely on a history of racing thoughts with no other symptoms and he had been on an antipsychotic which I thought was contributing to weight gain and was not operatively helpful for him once here he was pleasant cooperative displayed no dangerous behaviors and contract fully at the point of discharge he underwent med adjustments listed below and was stable  for least by the morning the 26th no thoughts of harming self or others no side effects and showing improvement in mood and affect Heberto, [place]  Home Medication Instructions OTR:[id number]  Printed on:08/11/16 1041   Medication Information  FLUoxetine (PROZAC) 40 MG capsule Take 1 capsule (40 mg total) by mouth daily. iron-FA-dha-epa-FAD-NADH-be-mv (ENLYTE) 1.5 mg iron- 8.73 mg capsule DR Take 1 capsule by mouth daily. If not on insurance go to [email or web address], to order pantoprazole (PROTONIX) 20 MG tablet TAKE 1 TABLET (20 MG TOTAL) BY MOUTH 2 TIMES DAILY FOR 30 DAYS. topiramate (TOPAMAX) 50 MG tablet Take 1 tablet (50 mg total) by mouth Three (3) times a day. Assessment/Plan:  Axis I Depression recurrent severe without psychosis Axis II defer Axis III obesity Reginia Forts MD DFAPA  Pt has been seen on outpatient basis after each hospitalization He has been seen at [facility] from 04/07/2011 -03/16/2012 and 11/13/2013 to Present  Past Medical History:  Past Medical History:  Diagnosis Date  . Abnormal laboratory test 02/12/2014  . Anxiety   . Bipolar disorder ([place])   . Depression   . Hx of substance abuse 02/12/2014  .  Psychosis     Past Surgical History:  Procedure Laterality Date  . TONSILLECTOMY AND ADENOIDECTOMY     Family Psychiatric History: Depression on both sides of family  Family History:  Family History  Problem Relation Age of Onset  . Depression Mother   . Anxiety disorder Mother   . Bipolar disorder Maternal Aunt    Social History:  Social History   Social History  . Marital status: Single    Spouse name: N/A  . Number of children: N/A  . Years of education: N/A   Social History Main Topics  . Smoking status: Light Tobacco Smoker    Types: Cigarettes  . Smokeless tobacco: Never Used  . Alcohol use 0.6 - 1.2 oz/week    1 - 2 Cans of beer per week     Comment: rare  . Drug use: No     Comment: THC last use over 1 year ago.  [name] Sexual activity: No   Other Topics Concern  . None   Social History Narrative  . None    Allergies: No Known Allergies  Metabolic Disorder Labs: No results found for: HGBA1C, MPG Lab Results  Component Value Date   PROLACTIN 5.0 04/29/2014   PROLACTIN 46.1 (H) 12/17/2013   No results found for: CHOL, TRIG, HDL, CHOLHDL, VLDL, LDLCALC   Current Medications: Current Outpatient Prescriptions  Medication Sig Dispense Refill  . Azelastine HCl 0.15 % SOLN Reported on 06/25/2015    . Dietary Management Product (ENLYTE PO) Take by mouth.    . OLANZapine (ZYPREXA) 7.5 MG tablet Take 1 hr before bedtime 30 tablet 2  . pantoprazole (PROTONIX) 20 MG tablet TAKE 1 TABLET (20 MG TOTAL) BY MOUTH 2 TIMES DAILY FOR 30 DAYS.    [name] pantoprazole (PROTONIX) 40 MG tablet Take 1 tablet (40 mg total) by mouth at bedtime. 1 hr after psych meds 30 tablet 2  . topiramate (TOPAMAX) 50 MG tablet Take 50 mg by mouth daily.      No current facility-administered medications for this visit.     Neurologic: Headache: Negative Seizure: Negative Paresthesias: Negative  Musculoskeletal: Strength & Muscle Tone: within normal limits Gait & Station: normal Patient  leans: N/A  Psychiatric Specialty Exam: Review of Systems  Constitutional: Positive for malaise/fatigue (Chronic complaint on and  off meds.Stops meds when he decides medication is making him tired). Negative for chills, diaphoresis, fever and weight loss.  HENT: Negative for congestion, ear discharge, ear pain, hearing loss, nosebleeds, sinus pain, sore throat and tinnitus.   Eyes: Negative for blurred vision, double vision, photophobia, pain, discharge and redness.  Respiratory: Negative for cough, hemoptysis, sputum production, shortness of breath, wheezing and stridor.   Cardiovascular: Negative for chest pain, palpitations, orthopnea, claudication, leg swelling and PND.  Gastrointestinal: Positive for nausea (am on awakening) and vomiting (some with nausea). Negative for abdominal pain, blood in stool, constipation, diarrhea and melena.  Genitourinary: Negative for dysuria, flank pain, frequency, hematuria and urgency.  Musculoskeletal: Negative for back pain, falls, joint pain, myalgias and neck pain.  Skin: Negative for itching and rash.  Neurological: Negative for dizziness, tingling, tremors, sensory change, speech change, focal weakness, seizures, loss of consciousness, weakness and headaches.  Endo/Heme/Allergies: Negative for environmental allergies and polydipsia. Does not bruise/bleed easily.  Psychiatric/Behavioral: Positive for depression (c/o depression today with fatigue) and substance abuse (Past hx- remarks he refuses to return to use to deal with current symptoms). Negative for hallucinations, memory loss and suicidal ideas. The patient is nervous/anxious (Chronic anxiety now irritabilty is gone ?cycling from incident) and has insomnia (hypersomnia).     Blood pressure 126/70, resp. rate 16, height 5' 7"  (1.702 m), weight 280 lb (127 kg), SpO2 97 %.Body mass index is 43.85 kg/m.  General Appearance: Well Groomed / dysphoric  Eye Contact:  Fair  Speech:  Slow  Volume:  Normal   Mood:  Dysphoric   Affect:  Appropriate and Congruent  Thought Process:  Coherent, Goal Directed and Descriptions of Associations: Intact  Orientation:  Full (Time, Place, and Person)  Thought Content: Logical, Obsessions and Rumination C/O Racing intolerably at times  Suicidal Thoughts:  No  Homicidal Thoughts:  No  Memory:  Negative  Judgement:  Fair  Insight:  Fair  Psychomotor Activity:  Restlessness  Concentration:  Concentration: intact for visit and Attention Span: intact for visit  Recall:  Good  Fund of Knowledge: Fair  Language: Fair  Akathisia:  NA  Handed:  Right  AIMS (if indicated):  NA  Assets:  Desire for Improvement Financial Resources/Insurance Housing Physical Health Resilience Social Support Transportation Vocational/Educational  ADL's:  Intact  Cognition: WNL  Sleep:  Impaired   Treatment Plan Summary: Continue current Prozac and Zyprexa dosages Increase Topomax to 100 mg FU with Counselor BEGIN EXERCISING-he agrees to  FU 1 month for med Designer, 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

## 2016-10-11 ENCOUNTER — Ambulatory Visit (INDEPENDENT_AMBULATORY_CARE_PROVIDER_SITE_OTHER): Payer: BLUE CROSS/BLUE SHIELD | Admitting: Licensed Clinical Social Worker

## 2016-10-11 DIAGNOSIS — F411 Generalized anxiety disorder: Secondary | ICD-10-CM | POA: Diagnosis not present

## 2016-10-11 DIAGNOSIS — F319 Bipolar disorder, unspecified: Secondary | ICD-10-CM

## 2016-10-11 NOTE — Progress Notes (Signed)
   THERAPIST PROGRESS NOTE  Session Time: 9:10am-10:03am  Participation Level: Active  Behavioral Response: CasualAlertAnxious  (held tightly onto a pillow throughout the session)  Type of Therapy: Individual Therapy  Treatment Goals addressed: Reduce symptoms of depression and anxiety, increase satisfaction with relationships  Interventions: Treatment planning, assessment    Suicidal/Homicidal: Denied both  Therapist Interventions:  Collaborated with patient to develop his treatment plan.  Briefly described interventions he can expect as he participates in therapy. Explored beliefs about friendships and patient's history of developing friendships.      Summary:  Developed the following treatment goals: Andrew Noble will experience improvement with mood as evidenced by a reduced score on the PHQ-9 and the GAD-7 from the severe score range to moderate or less.    Andrew Noble will report feeling satisfied with his relationships, both friends and family.  He will strive for connection rather than pushing people away.  Talked about having conflicting behavior when it comes to interacting with others.  Sometimes he puts others before himself and ends up feeling taken advantage of, while other times he makes assumptions about people which get in the way of him connecting with others.  Acknowledged that with his bipolar disorder there are times when he is very interested in being social and other times when he isolates.        Plan: Return again in approximately 2 weeks.  May explore patient's understanding of bipolar disorder.  Diagnosis: Bipolar I Disorder                         GAD    Andrew LuisSolomon, Sarah A, LCSW 10/11/2016

## 2016-10-20 ENCOUNTER — Ambulatory Visit (HOSPITAL_COMMUNITY): Payer: Self-pay | Admitting: Medical

## 2016-10-24 DIAGNOSIS — K760 Fatty (change of) liver, not elsewhere classified: Secondary | ICD-10-CM | POA: Diagnosis not present

## 2016-10-24 DIAGNOSIS — E7800 Pure hypercholesterolemia, unspecified: Secondary | ICD-10-CM | POA: Diagnosis not present

## 2016-10-24 DIAGNOSIS — R748 Abnormal levels of other serum enzymes: Secondary | ICD-10-CM | POA: Diagnosis not present

## 2016-10-24 DIAGNOSIS — R7989 Other specified abnormal findings of blood chemistry: Secondary | ICD-10-CM | POA: Diagnosis not present

## 2016-10-26 ENCOUNTER — Ambulatory Visit (INDEPENDENT_AMBULATORY_CARE_PROVIDER_SITE_OTHER): Payer: BLUE CROSS/BLUE SHIELD | Admitting: Licensed Clinical Social Worker

## 2016-10-26 ENCOUNTER — Telehealth (HOSPITAL_COMMUNITY): Payer: Self-pay | Admitting: Medical

## 2016-10-26 DIAGNOSIS — F319 Bipolar disorder, unspecified: Secondary | ICD-10-CM | POA: Diagnosis not present

## 2016-10-26 DIAGNOSIS — F411 Generalized anxiety disorder: Secondary | ICD-10-CM

## 2016-10-26 NOTE — Telephone Encounter (Signed)
Its up to Dr Alta CorningAhktar

## 2016-10-26 NOTE — Telephone Encounter (Signed)
Dr, Gilmore LarocheAkhtar, would you be willing to take over care for patient.   Please see previous conversation.

## 2016-10-26 NOTE — Progress Notes (Signed)
   THERAPIST PROGRESS NOTE  Session Time: 9:05am-10:05am  Participation Level: Active  Behavioral Response: CasualAlert Appeared more relaxed today  Type of Therapy: Individual Therapy  Treatment Goals addressed: Reduce symptoms of depression and anxiety, increase satisfaction with relationships  Interventions: Psycho-ed about bipolar    Suicidal/Homicidal: Denied both  Therapist Interventions: Assessed patient's knowledge about bipolar disorder.  Had him complete a Mood Disorder Questionnaire.  Prompted him to describe some of his experiences with mania/hypomania.  Talked about how despite having episodes of depression and mania/hypomania you can function effectively in most environments.       Summary:  Was able to identify some of the symptoms of mania.  Endorsed all of the symptoms when he completed the questionnaire.  Disclosed that his mom has control of his money because he has a history of excessive spending.  Wonders how he can overcome this tendency so that he is able to manage his own finances.   Indicated he appreciated therapist taking time to review the symptoms.  Commented on how he thought it could be helpful for his family to learn about bipolar as well.       Indicated that his mood has been relatively stable lately.  He is doing well with his job.  Reports feeling a sense of belonging when he is at work.     Plan: Patient says he is going to ask his mom to join a session so she can get a better understanding of bipolar disorder.  Diagnosis: Bipolar I Disorder                         GAD    Marilu FavreSolomon, Ericia Moxley A, LCSW 10/26/2016

## 2016-10-26 NOTE — Telephone Encounter (Signed)
Patient would like to switch providers to Dr. Gilmore LarocheAkhtar. He wants to stay in the [place] area, but due to his work schedule he can not always come on Thursdays.  This is also affecting when he can get his refills on his medication  Please advise.

## 2016-10-27 ENCOUNTER — Ambulatory Visit (HOSPITAL_COMMUNITY): Payer: Self-pay | Admitting: Medical

## 2016-10-27 NOTE — Telephone Encounter (Signed)
Per Leonette Mostharles- Patient is willing to see patient early (10:00am) to help with him to get appointments.   Left message for patient to return call.

## 2016-10-31 NOTE — Telephone Encounter (Signed)
Talked with Andrew Noble. Patient to arrange for Thursday to continue see him. He will talk with his parents.

## 2016-11-01 NOTE — Telephone Encounter (Signed)
Pt aware that Andrew Noble will work with his schedule. Made appt on 7/19 at 10:00am.   Nothing further needed at this time.

## 2016-11-02 NOTE — Telephone Encounter (Signed)
See you then :)

## 2016-11-03 ENCOUNTER — Ambulatory Visit (INDEPENDENT_AMBULATORY_CARE_PROVIDER_SITE_OTHER): Payer: BLUE CROSS/BLUE SHIELD | Admitting: Medical

## 2016-11-03 ENCOUNTER — Ambulatory Visit (HOSPITAL_COMMUNITY): Payer: Self-pay | Admitting: Medical

## 2016-11-03 ENCOUNTER — Encounter (HOSPITAL_COMMUNITY): Payer: Self-pay | Admitting: Medical

## 2016-11-03 VITALS — BP 124/76 | HR 103 | Resp 18 | Ht 67.0 in | Wt 284.0 lb

## 2016-11-03 DIAGNOSIS — F17210 Nicotine dependence, cigarettes, uncomplicated: Secondary | ICD-10-CM

## 2016-11-03 DIAGNOSIS — Z79899 Other long term (current) drug therapy: Secondary | ICD-10-CM | POA: Diagnosis not present

## 2016-11-03 DIAGNOSIS — F418 Other specified anxiety disorders: Secondary | ICD-10-CM

## 2016-11-03 DIAGNOSIS — Z818 Family history of other mental and behavioral disorders: Secondary | ICD-10-CM | POA: Diagnosis not present

## 2016-11-03 DIAGNOSIS — F319 Bipolar disorder, unspecified: Secondary | ICD-10-CM | POA: Diagnosis not present

## 2016-11-03 DIAGNOSIS — G4700 Insomnia, unspecified: Secondary | ICD-10-CM

## 2016-11-03 MED ORDER — FLUOXETINE HCL 40 MG PO CAPS: 40.0000 mg | ORAL_CAPSULE | Freq: Every day | ORAL | 1 refills | Status: DC

## 2016-11-03 MED ORDER — OLANZAPINE 7.5 MG PO TABS: ORAL_TABLET | ORAL | 1 refills | Status: DC
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
## 2016-11-23 ENCOUNTER — Ambulatory Visit (INDEPENDENT_AMBULATORY_CARE_PROVIDER_SITE_OTHER): Payer: BLUE CROSS/BLUE SHIELD | Admitting: Licensed Clinical Social Worker

## 2016-11-23 DIAGNOSIS — F3130 Bipolar disorder, current episode depressed, mild or moderate severity, unspecified: Secondary | ICD-10-CM | POA: Diagnosis not present

## 2016-11-23 DIAGNOSIS — F411 Generalized anxiety disorder: Secondary | ICD-10-CM

## 2016-11-23 DIAGNOSIS — F319 Bipolar disorder, unspecified: Secondary | ICD-10-CM

## 2016-11-23 NOTE — Progress Notes (Signed)
   THERAPIST PROGRESS NOTE  Session Time: 9:01am-10:05am  Participation Level: Active  Behavioral Response: CasualAlert Depressed  Type of Therapy: Individual Therapy  Treatment Goals addressed: Reduce symptoms of depression and anxiety, increase satisfaction with relationships  Interventions: Strengths based, psycho-ed about bipolar   Suicidal/Homicidal: Denied both  Therapist Interventions: Discussed how people who have not experienced mental health problems misunderstand what it means to have a mental health disorder.  Noted there are a lot of aspects of depression and bipolar that are not within a person's control.  Discussed how people sometimes have unrealistic expectations for the person that is experiencing a mood episode.  Agreed with patient that the media contributes to people's ignorance and it is important to educate the greater community about mental health issues.    Prompted patient to identify some of the things that have helped him to achieve greater mood stabilization and progress towards his goals.     Summary:   Reflected on how he has come a long way in the past 3-4 years when it comes to accepting his mental illness and taking action to treat it.  Identified taking his medication consistently and reaching out to his family as two key factors.  He also described how for the past 6 months or so he has used an app on his phone to make to do lists.  This has helped him to stay on top of things he needs to accomplish.    Talked about how he believes his mom is afraid of him making dangerous choices.  He said "She may even be afraid of me in general."  Wants her to develop a better understanding of his illness.     Plan: Patient still plans to ask his mom to join a session.  Diagnosis: Bipolar I Disorder                         GAD    Marilu FavreSolomon, Cortni Tays A, LCSW 11/23/2016

## 2016-12-12 ENCOUNTER — Ambulatory Visit (HOSPITAL_COMMUNITY): Payer: BLUE CROSS/BLUE SHIELD | Admitting: Licensed Clinical Social Worker

## 2016-12-12 DIAGNOSIS — F411 Generalized anxiety disorder: Secondary | ICD-10-CM

## 2016-12-12 DIAGNOSIS — F319 Bipolar disorder, unspecified: Secondary | ICD-10-CM

## 2016-12-13 NOTE — Progress Notes (Signed)
   THERAPIST PROGRESS NOTE  Session Time: 9:17am-10:03am  Participation Level: Active  Behavioral Response: CasualAlert Depressed  Type of Therapy: Individual Therapy  Treatment Goals addressed: Reduce symptoms of depression and anxiety, increase satisfaction with relationships  Interventions:    Suicidal/Homicidal: Denied both  Therapist Interventions: Learned more about patient's experiences over the past 3-4 years.  Encouraged reflection on how far he has come in that time.  Assessed motivation to connect with others.             Summary:  Arrived 15 minutes late.  Said he slept through his alarm.   Reported having a bad day at work recently.  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     Talked about how he struggles to develop meaningful relationships.  Reflected on how after he graduated high school he ended up living among peers who were into drugs and alcohol.  These were people he wasn't interested in getting to know on a personal level.  Noted that it was difficult to live with others and he is much happier living on his own.  Reflected on how his life was chaotic for a while and his life is "boring" in comparison now.  York Spaniel he has come to appreciate what he has.  Indicated he is not particularly motivated to develop relationships at this time.  He said "I just want to find a job where I feel like I belong.  I want to be able to pay my bills.  I want a place of my own."        Plan: Return in 2-3 weeks.  May introduce mindfulness.  Diagnosis: Bipolar I Disorder                         GAD    Marilu Favre, LCSW 12/12/2016

## 2017-01-10 ENCOUNTER — Ambulatory Visit (INDEPENDENT_AMBULATORY_CARE_PROVIDER_SITE_OTHER): Payer: BLUE CROSS/BLUE SHIELD | Admitting: Licensed Clinical Social Worker

## 2017-01-10 DIAGNOSIS — S36119A Unspecified injury of liver, initial encounter: Secondary | ICD-10-CM | POA: Diagnosis not present

## 2017-01-10 DIAGNOSIS — R7989 Other specified abnormal findings of blood chemistry: Secondary | ICD-10-CM | POA: Diagnosis not present

## 2017-01-10 DIAGNOSIS — E876 Hypokalemia: Secondary | ICD-10-CM | POA: Diagnosis not present

## 2017-01-10 DIAGNOSIS — Z79899 Other long term (current) drug therapy: Secondary | ICD-10-CM | POA: Diagnosis not present

## 2017-01-10 DIAGNOSIS — F332 Major depressive disorder, recurrent severe without psychotic features: Secondary | ICD-10-CM | POA: Diagnosis not present

## 2017-01-10 DIAGNOSIS — F319 Bipolar disorder, unspecified: Secondary | ICD-10-CM

## 2017-01-10 DIAGNOSIS — F3163 Bipolar disorder, current episode mixed, severe, without psychotic features: Secondary | ICD-10-CM | POA: Diagnosis not present

## 2017-01-10 DIAGNOSIS — N179 Acute kidney failure, unspecified: Secondary | ICD-10-CM | POA: Diagnosis not present

## 2017-01-10 DIAGNOSIS — D72829 Elevated white blood cell count, unspecified: Secondary | ICD-10-CM | POA: Diagnosis not present

## 2017-01-10 DIAGNOSIS — X58XXXA Exposure to other specified factors, initial encounter: Secondary | ICD-10-CM | POA: Diagnosis not present

## 2017-01-10 DIAGNOSIS — E871 Hypo-osmolality and hyponatremia: Secondary | ICD-10-CM | POA: Diagnosis not present

## 2017-01-10 DIAGNOSIS — E870 Hyperosmolality and hypernatremia: Secondary | ICD-10-CM | POA: Diagnosis not present

## 2017-01-10 DIAGNOSIS — D72819 Decreased white blood cell count, unspecified: Secondary | ICD-10-CM | POA: Diagnosis not present

## 2017-01-10 DIAGNOSIS — F411 Generalized anxiety disorder: Secondary | ICD-10-CM | POA: Diagnosis not present

## 2017-01-10 DIAGNOSIS — D649 Anemia, unspecified: Secondary | ICD-10-CM | POA: Diagnosis not present

## 2017-01-10 DIAGNOSIS — E875 Hyperkalemia: Secondary | ICD-10-CM | POA: Diagnosis not present

## 2017-01-10 DIAGNOSIS — E872 Acidosis: Secondary | ICD-10-CM | POA: Diagnosis not present

## 2017-01-10 DIAGNOSIS — R945 Abnormal results of liver function studies: Secondary | ICD-10-CM | POA: Diagnosis not present

## 2017-01-10 DIAGNOSIS — D696 Thrombocytopenia, unspecified: Secondary | ICD-10-CM | POA: Diagnosis not present

## 2017-01-10 DIAGNOSIS — R45851 Suicidal ideations: Secondary | ICD-10-CM | POA: Diagnosis not present
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
## 2017-01-11 DIAGNOSIS — F3163 Bipolar disorder, current episode mixed, severe, without psychotic features: Secondary | ICD-10-CM | POA: Diagnosis not present

## 2017-01-11 DIAGNOSIS — F319 Bipolar disorder, unspecified: Secondary | ICD-10-CM | POA: Diagnosis not present

## 2017-01-11 DIAGNOSIS — R7989 Other specified abnormal findings of blood chemistry: Secondary | ICD-10-CM | POA: Diagnosis not present

## 2017-01-13 DIAGNOSIS — R748 Abnormal levels of other serum enzymes: Secondary | ICD-10-CM | POA: Diagnosis not present

## 2017-01-13 DIAGNOSIS — K760 Fatty (change of) liver, not elsewhere classified: Secondary | ICD-10-CM | POA: Diagnosis not present

## 2017-01-13 DIAGNOSIS — Z23 Encounter for immunization: Secondary | ICD-10-CM | POA: Diagnosis not present

## 2017-01-13 DIAGNOSIS — F319 Bipolar disorder, unspecified: Secondary | ICD-10-CM | POA: Diagnosis not present

## 2017-01-19 ENCOUNTER — Other Ambulatory Visit (HOSPITAL_COMMUNITY): Payer: Self-pay | Admitting: *Deleted

## 2017-01-19 ENCOUNTER — Ambulatory Visit (INDEPENDENT_AMBULATORY_CARE_PROVIDER_SITE_OTHER): Payer: BLUE CROSS/BLUE SHIELD | Admitting: Medical

## 2017-01-19 ENCOUNTER — Encounter (HOSPITAL_COMMUNITY): Payer: Self-pay | Admitting: Medical

## 2017-01-19 DIAGNOSIS — F319 Bipolar disorder, unspecified: Secondary | ICD-10-CM

## 2017-01-19 DIAGNOSIS — Z9114 Patient's other noncompliance with medication regimen: Secondary | ICD-10-CM

## 2017-01-19 DIAGNOSIS — Z87898 Personal history of other specified conditions: Secondary | ICD-10-CM

## 2017-01-19 DIAGNOSIS — R748 Abnormal levels of other serum enzymes: Secondary | ICD-10-CM | POA: Diagnosis not present

## 2017-01-19 DIAGNOSIS — Z79899 Other long term (current) drug therapy: Secondary | ICD-10-CM | POA: Diagnosis not present

## 2017-01-19 DIAGNOSIS — R4589 Other symptoms and signs involving emotional state: Secondary | ICD-10-CM

## 2017-01-19 DIAGNOSIS — Z6841 Body Mass Index (BMI) 40.0 and over, adult: Secondary | ICD-10-CM

## 2017-01-19 DIAGNOSIS — F1911 Other psychoactive substance abuse, in remission: Secondary | ICD-10-CM

## 2017-01-19 MED ORDER — ARIPIPRAZOLE 10 MG PO TABS: 10.0000 mg | ORAL_TABLET | Freq: Every day | ORAL | 0 refills | Status: DC

## 2017-01-19 MED ORDER — ARIPIPRAZOLE ER 400 MG IM PRSY: 400.0000 mg | PREFILLED_SYRINGE | Freq: Once | INTRAMUSCULAR | 0 refills | Status: DC

## 2017-01-19 MED ORDER — LAMOTRIGINE 25 MG PO TABS: ORAL_TABLET | ORAL | 0 refills | Status: DC
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
## 2017-01-19 NOTE — Telephone Encounter (Signed)
Medication refill- pt was seen in office today. Per Joylene Draft, PA-C, pt will start Abilify  once per day for the next 14 days. Per Maryjean Morn, PA-C, please send a new prescription for Abilify , #14 and Abilify ER  injection to [facility]. Called and informed pt of refill status and directions. Pt is schedule for a follow up apt on 02/09/17. Pt verbalizes understanding.

## 2017-01-25 DIAGNOSIS — F319 Bipolar disorder, unspecified: Secondary | ICD-10-CM | POA: Diagnosis not present

## 2017-01-25 DIAGNOSIS — Z0000 Encounter for general adult medical examination without abnormal findings: Secondary | ICD-10-CM | POA: Diagnosis not present

## 2017-01-25 DIAGNOSIS — R748 Abnormal levels of other serum enzymes: Secondary | ICD-10-CM | POA: Diagnosis not present

## 2017-01-26 ENCOUNTER — Ambulatory Visit (HOSPITAL_COMMUNITY): Payer: Self-pay | Admitting: Medical

## 2017-02-01 ENCOUNTER — Telehealth (HOSPITAL_COMMUNITY): Payer: Self-pay | Admitting: Medical

## 2017-02-01 ENCOUNTER — Other Ambulatory Visit (HOSPITAL_COMMUNITY): Payer: Self-pay | Admitting: Medical

## 2017-02-01 DIAGNOSIS — F319 Bipolar disorder, unspecified: Secondary | ICD-10-CM

## 2017-02-01 MED ORDER — ARIPIPRAZOLE ER 400 MG IM PRSY: 400.0000 mg | PREFILLED_SYRINGE | Freq: Once | INTRAMUSCULAR | 0 refills | Status: DC

## 2017-02-01 NOTE — Telephone Encounter (Signed)
Pt called to state he is running out of Abilify 10mg . He was told to take it for 14 days and do injection.  Pt does not have the injection. Pt would just like to continue the abilify rx until he sees charles next Thursday 10/25. Pt is has 2 pills left.    Please advise.

## 2017-02-01 NOTE — Telephone Encounter (Signed)
[facility]- thought Andrew MaduroRobert was to get shot tomorrow? [facility] [place]

## 2017-02-01 NOTE — Telephone Encounter (Signed)
Okay, I called and spoke to the patient, he is due for his next injection on 10/26, I explained to him that I had submitted his info to the [employer] program and that he would get a call from some 800 numbers to verify his information, I explained that it is important that he answer those calls so that we can get his injection faster. Patient voiced his understanding and I gave him my number for any questions.

## 2017-02-01 NOTE — Progress Notes (Unsigned)
Obtained sample of Abilify if needed for appt tomorrow

## 2017-02-01 NOTE — Telephone Encounter (Signed)
Andrew Noble, Patient is scheduled to see you next Thursday 10/25, he does not want the injection until he sees you, that is why he was asking for one more week of Abilify pills. I have submitted the prescription via the [employer]ure web site and I should hear from them tomorrow. They will do any of the prior authorization, they will ship the injection directly to the office and if patient qualifies they will get him patient assistance.

## 2017-02-01 NOTE — Telephone Encounter (Signed)
Check with Dr Alta CorningAhktar I dont think he needs anymore oral Medication -my understanding from Dr Alta CorningAhktar was he only needed to take the prescribed amount so the shot could build up in his system then he could be maintained on the shot. He doesnt need any more pills.I misunderstood the timing of his next injection thinking it was when he ran out of oral meds but that is not the case.He is due for next shot next week Thanks

## 2017-02-01 NOTE — Telephone Encounter (Signed)
Patient needs to continue the Abilify oral mediation until he gets the injection and for 1 week after the injection. Patient only has 2 pills left and he does not get his injection until next week. [facility] is on vacation this week.

## 2017-02-01 NOTE — Telephone Encounter (Signed)
Pt got 1st shot 9/26

## 2017-02-02 ENCOUNTER — Ambulatory Visit (HOSPITAL_COMMUNITY): Payer: Self-pay | Admitting: Medical

## 2017-02-02 NOTE — Telephone Encounter (Signed)
Thanks

## 2017-02-02 NOTE — Telephone Encounter (Signed)
Patient has an appt with Paulla Forearolyn MacDonald on 1/16 at 9 at [facility].  Patient also has an appt with Baron Hamperiffany Mills on 11/1 at 11. She is a Paramedictherapist.  informed [name] that patient does not need to be seen by two therapist. (sarah and tiffany)  Pt will discuss this with sarah a next visit on 10/23.   Called Dr. Imagene GurneyLove's (physican he seen in ER [facility] office to see if he can get in any sooner. Informed them that they started the Injectable medication and they need monitor patient on this as Leonette MostCharles has not ordered the medication. They placed patient on a waiting list and has him marked as high priority to try to get him in sooner. We will continue to see patient until he is seen by their office.

## 2017-02-07 ENCOUNTER — Ambulatory Visit (INDEPENDENT_AMBULATORY_CARE_PROVIDER_SITE_OTHER): Payer: BLUE CROSS/BLUE SHIELD | Admitting: Licensed Clinical Social Worker

## 2017-02-07 DIAGNOSIS — F319 Bipolar disorder, unspecified: Secondary | ICD-10-CM | POA: Diagnosis not present

## 2017-02-07 DIAGNOSIS — F411 Generalized anxiety disorder: Secondary | ICD-10-CM | POA: Diagnosis not present

## 2017-02-07 NOTE — Progress Notes (Signed)
   THERAPIST PROGRESS NOTE  Session Time: 10:10am-11:05am  Participation Level: Active  Behavioral Response: CasualAlert Frustrated  Type of Therapy: Individual Therapy  Treatment Goals addressed: Reduce symptoms of depression and anxiety, increase satisfaction with relationships  Interventions: Assessment, problem solving   Suicidal/Homicidal: Denied both  Therapist Interventions:  Asked patient to describe what prompted him to go to the ER the day following his last therapy session.   Sought clarification about his plans for continuing his MH services. Addressed concerns about his financial situation.  Asked him to estimate how much he is spending on bills, food, gas, doctor visits, and medication each month.  Compared those expenses to his average monthly income. Discussed how mom and stepdad get frustrated with his money management.      Summary:  Reported getting into an argument with his mom the day he went to the ER.  He went to her for advise because he was trying to figure out where he could live.  She said he could not live with her and his dad and grandparents also said they could not accommodate him.  Patient claims during their interaction his mom accused him of not loving her and told him he needed to work on getting along better with his stepdad.  He came away from the interaction thinking "Why should I even bother being here?"  He did not have Noble plan to harm himself but he said he was afraid that could change so he decided to seek immediate help. Meanwhile he has gotten an appointment scheduled in January to establish care with Noble different psychiatrist.  They also scheduled him Noble therapy appointment with Noble different therapist, but he says he plans to cancel that appointment because he would like to continue to see this therapist.     Claims he has been taking his medications consistently over the past month and he feels "Noble lot better"- less depressed, not as tired, and  calmer overall.   Yesterday he moved into Noble new apartment.  His mom had to cosign because he does not earn enough to sign the lease himself.   Explained that all of his income goes to his mom and she pays his rent and bills.  Estimated that his current income does not cover all of his monthly expenses.  Noted he may have to consider taking on Noble second part time job.         Plan: [name].  May introduce mindfulness at next therapy session.  Diagnosis: Bipolar I Disorder                         GAD    Andrew Noble, Andrew Friedt A, LCSW 02/07/2017

## 2017-02-07 NOTE — Telephone Encounter (Signed)
FYI: Called and spoke with [place] from [facility]. Per Aundra MilletMegan, pharmacy is currently preparing Abilify. Informed [employer] program, pt is due for injection on 02/09/17. Per Aundra MilletMegan, a message was sent to pharmacy to expedite injection. I will need to contact [employer] Program tomorrow at [phone].

## 2017-02-08 NOTE — Telephone Encounter (Signed)
Thanks See u tomorrow

## 2017-02-08 NOTE — Telephone Encounter (Signed)
Will see tomorrow-as usual it appears Andrew Noble failed to follow directions

## 2017-02-08 NOTE — Telephone Encounter (Signed)
Called and spoke with Aundra MilletMegan from [place] program. Per Aundra MilletMegan, the insurance department was unable to reach pt to verify insurance information. I was transferred to [name] with [facility] at [phone]. Per [name], medication order for Abilify injection was denied. The medication order was submitted for an appeal. Pharmacy will faxed the response to the [facility] location with 72 hours.  Called and informed pt injection was not received. Informed pt  Injection was submitted for an appeal through insurance. Pt states he would like to speak with [name], PA-C about taking the pill form only. Pt is schedule for a follow up apt on 02/09/17.

## 2017-02-09 ENCOUNTER — Ambulatory Visit (INDEPENDENT_AMBULATORY_CARE_PROVIDER_SITE_OTHER): Payer: BLUE CROSS/BLUE SHIELD | Admitting: Medical

## 2017-02-09 ENCOUNTER — Ambulatory Visit (HOSPITAL_COMMUNITY): Payer: BLUE CROSS/BLUE SHIELD | Admitting: Medical

## 2017-02-09 ENCOUNTER — Ambulatory Visit (INDEPENDENT_AMBULATORY_CARE_PROVIDER_SITE_OTHER): Payer: BLUE CROSS/BLUE SHIELD

## 2017-02-09 ENCOUNTER — Encounter (HOSPITAL_COMMUNITY): Payer: Self-pay

## 2017-02-09 ENCOUNTER — Other Ambulatory Visit (HOSPITAL_COMMUNITY): Payer: Self-pay

## 2017-02-09 ENCOUNTER — Encounter (HOSPITAL_COMMUNITY): Payer: Self-pay | Admitting: Medical

## 2017-02-09 DIAGNOSIS — Z9114 Patient's other noncompliance with medication regimen: Secondary | ICD-10-CM | POA: Diagnosis not present

## 2017-02-09 DIAGNOSIS — F319 Bipolar disorder, unspecified: Secondary | ICD-10-CM

## 2017-02-09 DIAGNOSIS — F418 Other specified anxiety disorders: Secondary | ICD-10-CM

## 2017-02-09 DIAGNOSIS — Z8659 Personal history of other mental and behavioral disorders: Secondary | ICD-10-CM

## 2017-02-09 DIAGNOSIS — R4589 Other symptoms and signs involving emotional state: Secondary | ICD-10-CM | POA: Diagnosis not present

## 2017-02-09 DIAGNOSIS — Z6841 Body Mass Index (BMI) 40.0 and over, adult: Secondary | ICD-10-CM

## 2017-02-09 MED ORDER — ARIPIPRAZOLE ER 400 MG IM PRSY
400.0000 mg | PREFILLED_SYRINGE | Freq: Once | INTRAMUSCULAR | Status: AC
  Administered 2017-02-09: 400 mg via INTRAMUSCULAR

## 2017-02-09 NOTE — Progress Notes (Signed)
Patient ID: Andrew Noble, male   DOB: Feb 11, 1996, 21 y.o.   MRN: [id number] Pt was scheduled for conference with Dr Alta CorningAhktar and myself after Abilify injection .Despite thorough explanation of how to receive injection thru discount program pt failede to respond to [employer] phone call and provide his insurance information. Fortunately a sample is available thru [facility] which can be given today. Pt will proceed to [facility] for shot and his appt here is rescheduled for next week. [employer] has been appealed with info and expect his medication will be available-If today's dose comes later it can be used to replace the sample

## 2017-02-09 NOTE — Progress Notes (Addendum)
All Notes   Progress Notes by Court JoyKober, Charles E, PA-C at 02/09/2017 11:00 AM   Author: Court JoyKober, Charles E, PA-C Author Type: Physician Assistant Certified Filed: 02/09/2017 11:30 AM  Note Status: Signed Cosign: Cosign Not Required Encounter Date: 02/09/2017  Editor: Eugenie NorrieKober, Charles E, PA-C (Physician Assistant Certified)  Sensitive Note    Patient ID: Andrew Noble, male   DOB: [birth year], 21 y.o.   MRN: [id number] Pt was scheduled for conference with Dr Alta CorningAhktar and myself after Abilify injection .Despite thorough explanation of how to receive injection thru discount program pt failede to respond to [employer] phone call and provide his insurance information. Fortunately a sample is available thru [facility] which can be given today. Pt will proceed to [facility] for [facility] and his appt here is rescheduled for next week. [employer] has been appealed with info and expect his medication will be available-If today's dose comes later it can be used to replace the sample    02/23/2017- [facility] pt [facility] his injection and Dr Rene KocherEksir was engaged and agreed to take patient.[facility] with Dr Rene KocherEksir. Pt should notify [facility]   Communication Routing History   There are no sent or routed communications associated with this encounter.   Contacted [facility] regarding pt need for appt BEFORE January since they administered a 30 Day in jection in ED in September. Spoke with Charlynn CourtKeena -referred me to practicemger who told Charlynn CourtKeena to tell me [facility]-Gave direct line -they had front desk #. Never heard back.

## 2017-02-09 NOTE — Progress Notes (Signed)
Patient came today for his injection of Abilify Maintena 400 mg IM. Patient presented with a flat affect and a good mood. Patient had questions about his injection and the pharmacy. I explained the process of getting the medication through [employer] and how to apply for patient assistance. Patient understood and verbalized that understanding. He also discussed wanting to see a psychiatrist here as he has aged out for Leonette MostCharles and needs an adult psychiatrist now. Per Leonette Mostharles last note he was fine with patient going to a new doctor and we scheduled him here with Dr. Rene KocherEksir.  Patient is not having any SI/HI or VH/AH. He states his appetite has been fine. Patients Abilify Maintena 400 mg injection was prepared as ordered and was given in the right bicep (per patient request) Patient tolerated well and had no questions or complaints.

## 2017-02-16 ENCOUNTER — Ambulatory Visit (HOSPITAL_COMMUNITY): Payer: Self-pay | Admitting: Medical

## 2017-02-24 ENCOUNTER — Ambulatory Visit (INDEPENDENT_AMBULATORY_CARE_PROVIDER_SITE_OTHER): Payer: BLUE CROSS/BLUE SHIELD | Admitting: Psychiatry

## 2017-02-24 ENCOUNTER — Encounter (HOSPITAL_COMMUNITY): Payer: Self-pay | Admitting: Psychiatry

## 2017-02-24 VITALS — BP 124/82 | HR 84 | Ht 67.0 in | Wt 294.0 lb

## 2017-02-24 DIAGNOSIS — F17210 Nicotine dependence, cigarettes, uncomplicated: Secondary | ICD-10-CM | POA: Diagnosis not present

## 2017-02-24 DIAGNOSIS — Z818 Family history of other mental and behavioral disorders: Secondary | ICD-10-CM | POA: Diagnosis not present

## 2017-02-24 DIAGNOSIS — F319 Bipolar disorder, unspecified: Secondary | ICD-10-CM | POA: Diagnosis not present

## 2017-02-24 NOTE — Progress Notes (Signed)
BH MD/PA/NP OP Progress Note  02/24/2017 11:59 AM Andrew Noble  MRN:  [id number]  Chief Complaint: Bipolar disorder HPI: Patient presents for transfer of care.  He is a reliable history of bipolar 1 disorder and a history of medication nonadherence.  He has been stable on Abilify 400 mg long-acting injection monthly, and is currently on his second injection.  He does not take any oral medications at this time.  He is sleeping well at night.  He denies any suicidal thoughts.  He is working consistently at [employer], and has good social support from his friend who is present at visit today.  The friend is able to corroborate that there are no acute safety issues and she has noticed his mood and affect being much more stable.  The patient agrees to continue Abilify long-acting injection monthly and follow-up in 3 months with this Clinical research associatewriter.  Visit Diagnosis:    ICD-10-CM   1. Bipolar 1 disorder (HCC) F31.9     Past Psychiatric History: See intake H&P for full details. Reviewed, with no updates at this time.   Past Medical History:  Past Medical History:  Diagnosis Date  . Abnormal laboratory test 02/12/2014  . Anxiety   . Bipolar disorder (HCC)   . Depression   . Hx of substance abuse 02/12/2014  . Psychosis [facility](HCC)     Past Surgical History:  Procedure Laterality Date  . TONSILLECTOMY AND ADENOIDECTOMY      Family Psychiatric History: See intake H&P for full details. Reviewed, with no updates at this time.   Family History:  Family History  Problem Relation Age of Onset  . Depression Mother   . Anxiety disorder Mother   . Bipolar disorder Maternal Aunt     Social History:  Social History   Socioeconomic History  . Marital status: Single    Spouse name: None  . Number of children: None  . Years of education: None  . Highest education level: None  Social Needs  . Financial resource strain: None  . Food insecurity - worry: None  . Food insecurity - inability:  None  . Transportation needs - medical: None  . Transportation needs - non-medical: None  Occupational History  . None  Tobacco Use  . Smoking status: Light Tobacco Smoker    Packs/day: 0.25    Types: Cigarettes  . Smokeless tobacco: Never Used  Substance and Sexual Activity  . Alcohol use: Yes    Alcohol/week: 0.6 - 1.2 oz    Types: 1 - 2 Cans of beer per week    Comment: rare  . Drug use: No    Comment: THC last use over 1 year ago.  [name]. Sexual activity: No  Other Topics Concern  . None  Social History Narrative  . None    Allergies: No Known Allergies  Metabolic Disorder Labs: No results found for: HGBA1C, MPG Lab Results  Component Value Date   PROLACTIN 5.0 04/29/2014   PROLACTIN 46.1 (H) 12/17/2013   No results found for: CHOL, TRIG, HDL, CHOLHDL, VLDL, LDLCALC No results found for: TSH  Therapeutic Level Labs: No results found for: LITHIUM Lab Results  Component Value Date   VALPROATE 84.5 10/31/2014   VALPROATE 83.4 04/29/2014   No components found for:  CBMZ  Current Medications: Current Outpatient Medications  Medication Sig Dispense Refill  . ARIPiprazole ER 400 MG PRSY Inject 400 mg into the muscle once. 1 each 0  . Azelastine HCl 0.15 % SOLN Reported on  06/25/2015    . Dietary Management Product (ENLYTE PO) Take by mouth.    . pantoprazole (PROTONIX) 20 MG tablet TAKE 1 TABLET (20 MG TOTAL) BY MOUTH 2 TIMES DAILY FOR 30 DAYS.     No current facility-administered medications for this visit.      Musculoskeletal: Strength & Muscle Tone: within normal limits Gait & Station: normal Patient leans: N/A  Psychiatric Specialty Exam: ROS  Blood pressure 124/82, pulse 84, height 5\' 7"  (1.702 m), weight 294 lb (133.4 kg).Body mass index is 46.05 kg/m.  General Appearance: Casual and Well Groomed  Eye Contact:  Good  Speech:  Clear and Coherent  Volume:  Normal  Mood:  Euthymic  Affect:  Congruent  Thought Process:  Goal Directed and Descriptions  of Associations: Intact  Orientation:  Full (Time, Place, and Person)  Thought Content: Logical   Suicidal Thoughts:  No  Homicidal Thoughts:  No  Memory:  Immediate;   Good  Judgement:  Good  Insight:  Fair  Psychomotor Activity:  Normal  Concentration:  Concentration: Good  Recall:  Good  Fund of Knowledge: Good  Language: Good  Akathisia:  Negative  Handed:  Right  AIMS (if indicated): not done  Assets:  Communication Skills Desire for Improvement Financial Resources/Insurance Housing Physical Health Social Support Transportation Vocational/Educational  ADL's:  Intact  Cognition: WNL  Sleep:  Good   Screenings: GAD-7     Office Visit from 09/22/2016 in [facility] AT [place]  Total GAD-7 Score  21    PHQ2-9     Office Visit from 09/22/2016 in [facility] AT [place] Office Visit from 04/21/2016 in [facility] AT [place]  PHQ-2 Total Score  5  5  PHQ-9 Total Score  23  21       Assessment and Plan:  Andrew LuisRobert Sipp presents as a stable for management of bipolar disorder, with Abilify 400 mg monthly long-acting injection.  He does not present with any acute safety issues, and has maintained consistent employment.  He is not currently taking any oral medications.  He does not appear to have any akathisia, or EPS.  He is sleeping well, and feels like his thinking is clear headed.  He is appropriate to continue monthly long-acting injection and follow-up in 3 months with this Clinical research associatewriter.  1. Bipolar 1 disorder (HCC)     Status of current problems: stable  Labs Ordered: No orders of the defined types were placed in this encounter.   Labs Reviewed: NA  Collateral Obtained/Records Reviewed: Patient's friend is present today and able to corroborate that his mood has been very stable and there are no acute safety issues  Plan:  Continue Abilify 400 mg monthly long-acting injection The patient  will present before his next injection on November 21 at this office Return to clinic in 3 months to see Clinical research associatewriter for med management follow-up  I spent 25 minutes with the patient in direct face-to-face clinical care.  Greater than 50% of this time was spent in counseling and coordination of care with the patient.    Burnard LeighAlexander Arya Eksir, MD 02/24/2017, 11:59 AM

## 2017-03-01 ENCOUNTER — Ambulatory Visit (HOSPITAL_COMMUNITY): Payer: Self-pay | Admitting: Licensed Clinical Social Worker

## 2017-03-08 ENCOUNTER — Encounter (HOSPITAL_COMMUNITY): Payer: Self-pay

## 2017-03-08 ENCOUNTER — Ambulatory Visit (HOSPITAL_COMMUNITY): Payer: BLUE CROSS/BLUE SHIELD

## 2017-03-08 VITALS — BP 120/72 | HR 96 | Ht 67.0 in | Wt 293.0 lb

## 2017-03-08 DIAGNOSIS — F319 Bipolar disorder, unspecified: Secondary | ICD-10-CM | POA: Diagnosis not present

## 2017-03-08 MED ORDER — ARIPIPRAZOLE ER 400 MG IM PRSY: 400.0000 mg | PREFILLED_SYRINGE | Freq: Once | INTRAMUSCULAR | 0 refills | Status: DC

## 2017-03-08 MED ORDER — ARIPIPRAZOLE ER 400 MG IM PRSY: 400.0000 mg | PREFILLED_SYRINGE | Freq: Once | INTRAMUSCULAR | 2 refills | Status: DC

## 2017-03-08 MED ORDER — ARIPIPRAZOLE ER 400 MG IM SRER
400.0000 mg | INTRAMUSCULAR | Status: AC
  Administered 2017-03-08: 400 mg via INTRAMUSCULAR

## 2017-03-08 NOTE — Progress Notes (Signed)
Patient presented with appropriate affect, level mood and denied any current suicidal or homicidal ideations, no auditory or visual hallucinations but states he has noted some increased irritability as he got closer to the time his injection was due.  Patient reported no side effects or issues with Abilify maintena and Dr. Ladona Ridgelaylor provided a new order to last until patient sees Dr. Rene KocherEksir 05/26/16.  Patient's due Abilify Maintena 400 mg IM injection prepared as ordered and authorized by Dr. Ladona Ridgelaylor and given to patient in his right upper outer gluteal area.  Patient tolerated injection without complaint of pain or discomfort and will return in 30 days for next injection.  Patient to call if any problems prior to next appointment and reported liking injection better in gluteal area than deltoid.

## 2017-03-30 ENCOUNTER — Telehealth (HOSPITAL_COMMUNITY): Payer: Self-pay | Admitting: *Deleted

## 2017-04-06 ENCOUNTER — Ambulatory Visit (INDEPENDENT_AMBULATORY_CARE_PROVIDER_SITE_OTHER): Payer: BLUE CROSS/BLUE SHIELD

## 2017-04-06 DIAGNOSIS — F319 Bipolar disorder, unspecified: Secondary | ICD-10-CM | POA: Diagnosis not present

## 2017-04-06 MED ORDER — ARIPIPRAZOLE ER 400 MG IM PRSY
400.0000 mg | PREFILLED_SYRINGE | Freq: Once | INTRAMUSCULAR | Status: AC
  Administered 2017-04-06: 400 mg via INTRAMUSCULAR

## 2017-04-06 NOTE — Progress Notes (Signed)
Patient presented with appropriate affect, level mood and denied any current suicidal or homicidal ideations, no auditory or visual hallucinations and states that the injection seems to be lasting longer now. Patient's due Abilify Maintena 400 mg IM injection prepared as ordered and given to patient in his left deltoid. Patient tolerated injection without complaint of pain or discomfort and will return in 30 days for next injection.  Patient to call if any problems prior to next appointment.

## 2017-04-07 DIAGNOSIS — R0683 Snoring: Secondary | ICD-10-CM | POA: Diagnosis not present

## 2017-04-07 DIAGNOSIS — G4739 Other sleep apnea: Secondary | ICD-10-CM | POA: Diagnosis not present

## 2017-04-07 DIAGNOSIS — R400 Somnolence: Secondary | ICD-10-CM | POA: Diagnosis not present

## 2017-04-10 DIAGNOSIS — F319 Bipolar disorder, unspecified: Secondary | ICD-10-CM | POA: Diagnosis not present

## 2017-05-03 ENCOUNTER — Encounter (HOSPITAL_COMMUNITY): Payer: Self-pay | Admitting: Psychiatry

## 2017-05-03 ENCOUNTER — Ambulatory Visit (INDEPENDENT_AMBULATORY_CARE_PROVIDER_SITE_OTHER): Payer: BLUE CROSS/BLUE SHIELD | Admitting: Psychiatry

## 2017-05-03 VITALS — BP 130/74 | HR 107 | Ht 67.0 in | Wt 294.0 lb

## 2017-05-03 DIAGNOSIS — F17210 Nicotine dependence, cigarettes, uncomplicated: Secondary | ICD-10-CM | POA: Diagnosis not present

## 2017-05-03 DIAGNOSIS — R0683 Snoring: Secondary | ICD-10-CM | POA: Diagnosis not present

## 2017-05-03 DIAGNOSIS — F319 Bipolar disorder, unspecified: Secondary | ICD-10-CM | POA: Diagnosis not present

## 2017-05-03 DIAGNOSIS — G4730 Sleep apnea, unspecified: Secondary | ICD-10-CM

## 2017-05-03 DIAGNOSIS — F1210 Cannabis abuse, uncomplicated: Secondary | ICD-10-CM

## 2017-05-03 DIAGNOSIS — Z818 Family history of other mental and behavioral disorders: Secondary | ICD-10-CM

## 2017-05-03 MED ORDER — ARIPIPRAZOLE ER 400 MG IM PRSY: 400.0000 mg | PREFILLED_SYRINGE | Freq: Once | INTRAMUSCULAR | 2 refills | Status: DC

## 2017-05-03 NOTE — Progress Notes (Signed)
BH MD/PA/NP OP Progress Note  05/03/2017 11:18 AM Andrew Noble  MRN:  [id number]  Chief Complaint:  Depressed, fatigued  HPI: Andrew Noble presents with his mother for psychiatric follow-up.  This is our second encounter.  He has been struggling with severe excessive daytime sleepiness.  I spent time reviewing his heavy snoring, history of choking and coughing in the evening during sleep.  He reports that he struggles with severe daytime sleepiness, can fall asleep at the movies, concerned about falling asleep driving.  He has also been struggling with ongoing depression and questions about what his future may look like in terms of schooling and education.  Educated him about my concern for sleep apnea and recommended we proceed quickly with a split-night sleep study.  I educated him that this can affect his cardiovascular system, neuropsychiatric disease, and can cause spontaneous death if untreated.  Mom and patient were receptive to the recommendations.  We agreed to continue Abilify as prescribed.  No acute suicidality, family aware of hospitalization and other emergency resources if needed.  Visit Diagnosis:    ICD-10-CM   1. Sleep-disordered breathing G47.30 Split night study    CANCELED: Polysomnography 4 or more parameters  2. Bipolar 1 disorder (HCC) F31.9 ARIPiprazole ER 400 MG PRSY    Split night study  3. Snoring R06.83 Split night study    CANCELED: Polysomnography 4 or more parameters    Past Psychiatric History: See intake H&P for full details. Reviewed, with no updates at this time.   Past Medical History:  Past Medical History:  Diagnosis Date  . Abnormal laboratory test 02/12/2014  . Anxiety   . Bipolar disorder (HCC)   . Depression   . Hx of substance abuse 02/12/2014  . Psychosis [facility])     Past Surgical History:  Procedure Laterality Date  . TONSILLECTOMY AND ADENOIDECTOMY      Family Psychiatric History: See intake H&P for full details. Reviewed, with no  updates at this time.   Family History:  Family History  Problem Relation Age of Onset  . Depression Mother   . Anxiety disorder Mother   . Bipolar disorder Maternal Aunt     Social History:  Social History   Socioeconomic History  . Marital status: Single    Spouse name: None  . Number of children: None  . Years of education: None  . Highest education level: None  Social Needs  . Financial resource strain: None  . Food insecurity - worry: None  . Food insecurity - inability: None  . Transportation needs - medical: None  . Transportation needs - non-medical: None  Occupational History  . None  Tobacco Use  . Smoking status: Light Tobacco Smoker    Packs/day: 0.25    Types: Cigarettes  . Smokeless tobacco: Never Used  Substance and Sexual Activity  . Alcohol use: Yes    Alcohol/week: 0.6 - 1.2 oz    Types: 1 - 2 Cans of beer per week    Comment: rare  . Drug use: Yes    Types: Marijuana  . Sexual activity: No  Other Topics Concern  . None  Social History Narrative  . None    Allergies: No Known Allergies  Metabolic Disorder Labs: No results found for: HGBA1C, MPG Lab Results  Component Value Date   PROLACTIN 5.0 04/29/2014   PROLACTIN 46.1 (H) 12/17/2013   No results found for: CHOL, TRIG, HDL, CHOLHDL, VLDL, LDLCALC No results found for: TSH  Therapeutic Level Labs:  No results found for: LITHIUM Lab Results  Component Value Date   VALPROATE 84.5 10/31/2014   VALPROATE 83.4 04/29/2014   No components found for:  CBMZ  Current Medications: Current Outpatient Medications  Medication Sig Dispense Refill  . ARIPiprazole ER 400 MG PRSY Inject 400 mg into the muscle once for 1 dose. 3 each 2  . pantoprazole (PROTONIX) 20 MG tablet TAKE 1 TABLET (20 MG TOTAL) BY MOUTH 2 TIMES DAILY FOR 30 DAYS.     No current facility-administered medications for this visit.      Musculoskeletal: Strength & Muscle Tone: within normal limits Gait & Station:  normal Patient leans: N/A  Psychiatric Specialty Exam: ROS  Blood pressure 130/74, pulse (!) 107, height 5\' 7"  (1.702 m), weight 294 lb (133.4 kg).Body mass index is 46.05 kg/m.  General Appearance: Casual and Fairly Groomed  Eye Contact:  Fair  Speech:  Slow  Volume:  Decreased  Mood:  Dysphoric and tired  Affect:  Congruent  Thought Process:  Goal Directed and Descriptions of Associations: Intact  Orientation:  Full (Time, Place, and Person)  Thought Content: Logical   Suicidal Thoughts:  No  Homicidal Thoughts:  No  Memory:  Immediate;   Fair  Judgement:  Fair  Insight:  Fair  Psychomotor Activity:  Decreased  Concentration:  Concentration: Fair  Recall:  Good  Fund of Knowledge: Good  Language: Good  Akathisia:  Negative  Handed:  Right  AIMS (if indicated): not done  Assets:  Communication Skills Desire for Improvement Financial Resources/Insurance Housing Transportation Vocational/Educational  ADL's:  Intact  Cognition: WNL  Sleep:  excessive, 12-14 hours, not feeling rested   Screenings: GAD-7     Office Visit from 09/22/2016 in [facility] AT [place]  Total GAD-7 Score  21    PHQ2-9     Office Visit from 09/22/2016 in [facility] AT [place] Office Visit from 04/21/2016 in [facility] AT [place]  PHQ-2 Total Score  5  5  PHQ-9 Total Score  23  21       Assessment and Plan:  Andrew Noble presents for medication management follow-up.  From a bipolar standpoint, he does not present any mania or psychosis, and his mood is fairly stable on Abilify.  I am concerned about severe sleep apnea contributing to excessive daytime sleepiness, cognitive dulling, and he and his mother are able to corroborate a history of coughing and choking at night, discontinuing breathing at night, heavy snoring, and excessive daytime sleepiness.  I would like to proceed more rapidly with a split-night  sleep study, and recommended the patient complete this study as soon as possible.  We will follow-up in 3 weeks as scheduled.  No acute safety issues or suicidality at this time.  1. Sleep-disordered breathing   2. Bipolar 1 disorder (HCC)   3. Snoring     Status of current problems: new problem - sleep apnea  Labs Ordered: Orders Placed This Encounter  Procedures  . Split night study    Standing Status:   Future    Standing Expiration Date:   05/03/2018    Order Specific Question:   Where should this test be performed:    [facility]    Labs Reviewed: n/a  Collateral Obtained/Records Reviewed: Mom is able to corroborate symptoms of suspected severe sleep apnea  Plan:  Recommend split-night sleep study for CPAP titration Continue Abilify maintaina Return to clinic in  3 weeks  I spent 25 minutes with the patient in direct face-to-face clinical care.  Greater than 50% of this time was spent in counseling and coordination of care with the patient.    Burnard Leigh, MD 05/03/2017, 11:18 AM

## 2017-05-03 NOTE — Addendum Note (Signed)
Addended by: Angus PalmsEKSIR, Rayli Wiederhold A on: 05/03/2017 02:03 PM   Modules accepted: Orders

## 2017-05-08 ENCOUNTER — Ambulatory Visit (INDEPENDENT_AMBULATORY_CARE_PROVIDER_SITE_OTHER): Payer: BLUE CROSS/BLUE SHIELD

## 2017-05-08 ENCOUNTER — Encounter (HOSPITAL_COMMUNITY): Payer: Self-pay

## 2017-05-08 DIAGNOSIS — F319 Bipolar disorder, unspecified: Secondary | ICD-10-CM | POA: Diagnosis not present

## 2017-05-08 DIAGNOSIS — R0683 Snoring: Secondary | ICD-10-CM

## 2017-05-08 DIAGNOSIS — G4730 Sleep apnea, unspecified: Secondary | ICD-10-CM

## 2017-05-08 MED ORDER — ARIPIPRAZOLE ER 400 MG IM PRSY: 400.0000 mg | PREFILLED_SYRINGE | Freq: Once | INTRAMUSCULAR | 2 refills | Status: DC

## 2017-05-09 ENCOUNTER — Encounter (HOSPITAL_COMMUNITY): Payer: Self-pay

## 2017-05-09 MED ORDER — ARIPIPRAZOLE ER 400 MG IM SRER
400.0000 mg | INTRAMUSCULAR | Status: DC
  Administered 2017-05-08 – 2017-06-09 (×2): 400 mg via INTRAMUSCULAR

## 2017-05-09 NOTE — Progress Notes (Signed)
Pt. Presented for his monthly injection of Abilify 400 mg. Tolerated the injection without difficulty. Pt also stated, "this medication is helping me so very much. I am glad that you have it". Was instructed to call if there were any n.or problems. Will return next month for his monthly injectio

## 2017-05-25 DIAGNOSIS — F319 Bipolar disorder, unspecified: Secondary | ICD-10-CM | POA: Diagnosis not present

## 2017-05-26 ENCOUNTER — Ambulatory Visit (HOSPITAL_COMMUNITY): Payer: Self-pay | Admitting: Psychiatry

## 2017-05-30 ENCOUNTER — Ambulatory Visit (HOSPITAL_BASED_OUTPATIENT_CLINIC_OR_DEPARTMENT_OTHER): Payer: BLUE CROSS/BLUE SHIELD | Attending: Psychiatry | Admitting: Internal Medicine

## 2017-05-30 VITALS — Ht 67.0 in | Wt 290.0 lb

## 2017-05-30 DIAGNOSIS — G4733 Obstructive sleep apnea (adult) (pediatric): Secondary | ICD-10-CM | POA: Insufficient documentation

## 2017-05-30 DIAGNOSIS — G4736 Sleep related hypoventilation in conditions classified elsewhere: Secondary | ICD-10-CM | POA: Insufficient documentation

## 2017-05-30 DIAGNOSIS — R0683 Snoring: Secondary | ICD-10-CM | POA: Diagnosis not present

## 2017-05-31 DIAGNOSIS — E7800 Pure hypercholesterolemia, unspecified: Secondary | ICD-10-CM | POA: Diagnosis not present

## 2017-05-31 DIAGNOSIS — R945 Abnormal results of liver function studies: Secondary | ICD-10-CM | POA: Diagnosis not present

## 2017-06-08 ENCOUNTER — Ambulatory Visit (HOSPITAL_COMMUNITY): Payer: Self-pay

## 2017-06-09 ENCOUNTER — Encounter (HOSPITAL_COMMUNITY): Payer: Self-pay

## 2017-06-09 ENCOUNTER — Ambulatory Visit (INDEPENDENT_AMBULATORY_CARE_PROVIDER_SITE_OTHER): Payer: BLUE CROSS/BLUE SHIELD

## 2017-06-09 VITALS — BP 112/78 | HR 92 | Ht 67.0 in | Wt 295.0 lb

## 2017-06-09 DIAGNOSIS — F319 Bipolar disorder, unspecified: Secondary | ICD-10-CM

## 2017-06-09 NOTE — Progress Notes (Signed)
Patient presented with appropriate affect, pleasant and level mood and stated the Abilify Maintena medication was working well for him.  Patient denied any current symptoms, no suicidal or homicidal ideations, no auditory or visual hallucinations and level mood along with no side effect issues with current medication regimen.  Patient reported hoping he could stay on "the injection" as he reports feeling the Abilify Roderic PalauMaintena is doing a "great" job for him.  Patient's due Abilify Maintena 400 mg IM injection prepared as ordered and given to patient in his left deltoid area.  Patient tolerated due injection without complaint of pain or discomfort and agreed to return in 30 days for next due injection.  Patient to call if any problems or issues prior to next appointment and agreed with this plan.

## 2017-06-10 DIAGNOSIS — G4733 Obstructive sleep apnea (adult) (pediatric): Secondary | ICD-10-CM

## 2017-06-10 NOTE — Procedures (Signed)
   Patient Name: Andrew Noble, Andrew Noble Study Date: 05/30/2017 Gender: Male D.O.B: July 14, 1995 Age (years): 21 Referring Provider: Angus PalmsAlexander Eksir Height (inches): 67 Interpreting Physician: Jetty Duhamellinton Alonte Wulff MD, ABSM Weight (lbs): 290 RPSGT: [place], [place] BMI: 45 MRN: [id number] Neck Size: 20.00 <br> <br> CLINICAL INFORMATION Sleep Study Type: HST Indication for sleep study: Excessive Daytime Sleepiness (780.79), Snoring (786.09)  Epworth Sleepiness Score: 14  SLEEP STUDY TECHNIQUE A multi-channel overnight portable sleep study was performed. The channels recorded were: nasal airflow, thoracic respiratory movement, and oxygen saturation with a pulse oximetry. Snoring was also monitored.  MEDICATIONS Patient self administered medications include: none reported.  SLEEP ARCHITECTURE Patient was studied for 254.3 minutes. The sleep efficiency was 99.0 % and the patient was supine for 98%. The arousal index was 0.0 per hour.  RESPIRATORY PARAMETERS The overall AHI was 119.4 per hour, with a central apnea index of 0.0 per hour. The oxygen nadir was 73% during sleep.  CARDIAC DATA Mean heart rate during sleep was 88.8 bpm.  IMPRESSIONS - Severe obstructive sleep apnea occurred during this study (AHI = 119.4/h). - No significant central sleep apnea occurred during this study (CAI = 0.0/h). - Oxygen desaturation was noted during this study (Min O2 = 73%). Mean saturation 90%. Time with saturation - Patient snored.  DIAGNOSIS - Obstructive Sleep Apnea (327.23 [G47.33 ICD-10]) - Nocturnal Hypoxemia (327.26 [G47.36 ICD-10])  RECOMMENDATIONS - CPAP titration or autoPAP. Other options would be based on clinical judgment. - Positional therapy avoiding supine position during sleep. - Be careful with alcohol, sedatives and other CNS depressants that may worsen sleep apnea and disrupt normal sleep architecture. - Sleep hygiene should be reviewed to assess factors that may improve sleep  quality. - Weight management and regular exercise should be initiated or continued.  [Electronically signed] 06/10/2017 10:26 AM  Jetty Duhamellinton Simmie Camerer MD, ABSM Diplomate, American Board of Sleep Medicine   NPI: [id number]                          Jetty Duhamellinton Mauricio Dahlen Diplomate, American Board of Sleep Medicine  ELECTRONICALLY SIGNED ON:  06/10/2017, 10:23 AM [facility] PH: [phone]   FX: [phone] ACCREDITED BY THE AMERICAN ACADEMY OF SLEEP MEDICINE

## 2017-06-12 ENCOUNTER — Other Ambulatory Visit (HOSPITAL_COMMUNITY): Payer: Self-pay | Admitting: Psychiatry

## 2017-06-12 DIAGNOSIS — G4733 Obstructive sleep apnea (adult) (pediatric): Secondary | ICD-10-CM

## 2017-06-14 ENCOUNTER — Other Ambulatory Visit (HOSPITAL_COMMUNITY): Payer: Self-pay | Admitting: Psychiatry

## 2017-06-14 DIAGNOSIS — G4733 Obstructive sleep apnea (adult) (pediatric): Secondary | ICD-10-CM

## 2017-06-23 DIAGNOSIS — F319 Bipolar disorder, unspecified: Secondary | ICD-10-CM | POA: Diagnosis not present

## 2017-07-07 ENCOUNTER — Ambulatory Visit (INDEPENDENT_AMBULATORY_CARE_PROVIDER_SITE_OTHER): Payer: BLUE CROSS/BLUE SHIELD

## 2017-07-07 DIAGNOSIS — F319 Bipolar disorder, unspecified: Secondary | ICD-10-CM

## 2017-07-07 MED ORDER — ARIPIPRAZOLE ER 400 MG IM PRSY
400.0000 mg | PREFILLED_SYRINGE | INTRAMUSCULAR | Status: DC
Start: 2017-07-07 — End: 2017-10-09
  Administered 2017-07-07 – 2017-09-04 (×3): 400 mg via INTRAMUSCULAR

## 2017-07-07 NOTE — Progress Notes (Signed)
Patient presented with appropriate affect, pleasant and level mood and stated the Abilify Maintena medication was working well for him.  Patient denied any current symptoms, no suicidal or homicidal ideations, no auditory or visual hallucinations and level mood along with no side effect issues with current medication regimen.  Patient reported feeling like the injection was wearing off at the end of the month and inquired about taking the oral tablet. I told patient that I would speak with the doctor. Patient's due Abilify Maintena 400 mg IM injection prepared as ordered and given to patient in his right deltoid area.  Patient tolerated due injection without complaint of pain or discomfort and agreed to return in 30 days for next due injection.  Patient to call if any problems or issues prior to next appointment and agreed with this plan.

## 2017-07-19 DIAGNOSIS — G4733 Obstructive sleep apnea (adult) (pediatric): Secondary | ICD-10-CM | POA: Diagnosis not present

## 2017-07-27 DIAGNOSIS — G4733 Obstructive sleep apnea (adult) (pediatric): Secondary | ICD-10-CM | POA: Diagnosis not present

## 2017-07-27 DIAGNOSIS — Z9989 Dependence on other enabling machines and devices: Secondary | ICD-10-CM | POA: Diagnosis not present

## 2017-07-31 ENCOUNTER — Other Ambulatory Visit (HOSPITAL_COMMUNITY): Payer: Self-pay

## 2017-08-04 ENCOUNTER — Encounter (HOSPITAL_COMMUNITY): Payer: Self-pay | Admitting: Psychiatry

## 2017-08-04 ENCOUNTER — Other Ambulatory Visit: Payer: Self-pay

## 2017-08-04 ENCOUNTER — Ambulatory Visit (INDEPENDENT_AMBULATORY_CARE_PROVIDER_SITE_OTHER): Payer: BLUE CROSS/BLUE SHIELD | Admitting: Psychiatry

## 2017-08-04 VITALS — BP 122/88 | HR 98 | Resp 16 | Wt 293.2 lb

## 2017-08-04 DIAGNOSIS — G4730 Sleep apnea, unspecified: Secondary | ICD-10-CM | POA: Diagnosis not present

## 2017-08-04 DIAGNOSIS — G4733 Obstructive sleep apnea (adult) (pediatric): Secondary | ICD-10-CM | POA: Diagnosis not present

## 2017-08-04 DIAGNOSIS — Z818 Family history of other mental and behavioral disorders: Secondary | ICD-10-CM | POA: Diagnosis not present

## 2017-08-04 DIAGNOSIS — F17210 Nicotine dependence, cigarettes, uncomplicated: Secondary | ICD-10-CM | POA: Diagnosis not present

## 2017-08-04 DIAGNOSIS — F319 Bipolar disorder, unspecified: Secondary | ICD-10-CM | POA: Diagnosis not present

## 2017-08-04 MED ORDER — MODAFINIL 100 MG PO TABS: 100.0000 mg | ORAL_TABLET | Freq: Every day | ORAL | 1 refills | Status: DC

## 2017-08-04 NOTE — Progress Notes (Signed)
BH MD/PA/NP OP Progress Note  08/04/2017 8:59 AM Andrew Noble  MRN:  [id number]  Chief Complaint: med management  HPI: Andrew Noble presents with ongoing severe fatigue, and depressive symptoms which tend to present towards the end of the month after his injections, feels like his Abilify is wearing away sooner.  Spent some time discussing his ongoing struggle with sleep apnea, he attempted to use both a fullface and nasal pillow without any success in treating sleep apnea symptoms.  His apnea hypoxia index is nearly 120, and I expressed to him that I am incredibly worried about his fatigue, and ability to have any meaningful long-term remission of depression without addressing sleep apnea..  I suggested a consultation with pulmonology, as they may be able to offer some additional benefits or tricks, the patient has already had a tonsillectomy and adenoidectomy.  He may also benefit from an ENT referral after pulmonary assesses him, as there may be some mouth guards were other devices that could reduce obstruction.  In the meantime, given his severe and chronic daily fatigue, it significantly impairs his ability to exercise, take active participation in his daily goals.  He sleeps 12 hours a day without any substantial improvement in his restfulness.  He is not using any alcohol or marijuana on any regular basis.  He smokes 3-4 cigarettes/day, and is working on reducing.  We agreed to initiate Provigil 100 mg daily, and I reviewed the risks that this could elicit anxiety and perhaps even mania, but given that he is on a stable long-acting injection, the risk of this is mitigated.  Educated him that this medication should be taken around 7-8 AM every day.  I educated him on my departure from the clinic at the end of August, and he wishes to remain with Clinical research associate for his care moving forward.   Visit Diagnosis:    ICD-10-CM   1. Obstructive sleep apnea G47.33 modafinil (PROVIGIL) 100 MG tablet     Ambulatory referral to Pulmonology  2. Bipolar 1 disorder (HCC) F31.9 modafinil (PROVIGIL) 100 MG tablet    Ambulatory referral to Pulmonology  3. Severe obstructive sleep apnea G47.33 modafinil (PROVIGIL) 100 MG tablet    Ambulatory referral to Pulmonology  4. Sleep-disordered breathing G47.30 modafinil (PROVIGIL) 100 MG tablet    Ambulatory referral to Pulmonology    Past Psychiatric History: See intake H&P for full details. Reviewed, with no updates at this time.   Past Medical History:  Past Medical History:  Diagnosis Date  . Abnormal laboratory test 02/12/2014  . Anxiety   . Bipolar disorder (HCC)   . Depression   . Hx of substance abuse 02/12/2014  . Psychosis [facility])     Past Surgical History:  Procedure Laterality Date  . TONSILLECTOMY AND ADENOIDECTOMY      Family Psychiatric History: See intake H&P for full details. Reviewed, with no updates at this time.   Family History:  Family History  Problem Relation Age of Onset  . Depression Mother   . Anxiety disorder Mother   . Bipolar disorder Maternal Aunt     Social History:  Social History   Socioeconomic History  . Marital status: Single    Spouse name: Not on file  . Number of children: Not on file  . Years of education: Not on file  . Highest education level: Not on file  Occupational History  . Not on file  Social Needs  . Financial resource strain: Not on file  . Food insecurity:  Worry: Not on file    Inability: Not on file  . Transportation needs:    Medical: Not on file    Non-medical: Not on file  Tobacco Use  . Smoking status: Light Tobacco Smoker    Packs/day: 0.25    Types: Cigarettes  . Smokeless tobacco: Never Used  . Tobacco comment: Has cut back to only 1-2 a day and trying to quit  Substance and Sexual Activity  . Alcohol use: Yes    Alcohol/week: 0.6 - 1.2 oz    Types: 1 - 2 Cans of beer per week    Comment: rare  . Drug use: Yes    Types: Marijuana    Comment: States he  has not used in awhile.  [name] Sexual activity: Never  Lifestyle  . Physical activity:    Days per week: Not on file    Minutes per session: Not on file  . Stress: Not on file  Relationships  . Social connections:    Talks on phone: Not on file    Gets together: Not on file    Attends religious service: Not on file    Active member of club or organization: Not on file    Attends meetings of clubs or organizations: Not on file    Relationship status: Not on file  Other Topics Concern  . Not on file  Social History Narrative  . Not on file    Allergies: No Known Allergies  Metabolic Disorder Labs: No results found for: HGBA1C, MPG Lab Results  Component Value Date   PROLACTIN 5.0 04/29/2014   PROLACTIN 46.1 (H) 12/17/2013   No results found for: CHOL, TRIG, HDL, CHOLHDL, VLDL, LDLCALC No results found for: TSH  Therapeutic Level Labs: No results found for: LITHIUM Lab Results  Component Value Date   VALPROATE 84.5 10/31/2014   VALPROATE 83.4 04/29/2014   No components found for:  CBMZ  Current Medications: Current Outpatient Medications  Medication Sig Dispense Refill  . ARIPiprazole ER 400 MG PRSY Inject 400 mg into the muscle once for 1 dose. 3 each 2  . modafinil (PROVIGIL) 100 MG tablet Take 1 tablet (100 mg total) by mouth daily. 30 tablet 1  . pantoprazole (PROTONIX) 20 MG tablet TAKE 1 TABLET (20 MG TOTAL) BY MOUTH 2 TIMES DAILY FOR 30 DAYS.     Current Facility-Administered Medications  Medication Dose Route Frequency Provider Last Rate Last Dose  . ARIPiprazole ER (ABILIFY MAINTENA) 400 MG prefilled syringe 400 mg  400 mg Intramuscular Q28 days Burnard Leigh, MD   400 mg at 07/07/17 1610     Musculoskeletal: Strength & Muscle Tone: within normal limits Gait & Station: normal Patient leans: N/A  Psychiatric Specialty Exam: ROS  Blood pressure 122/88, pulse 98, resp. rate 16, weight 293 lb 3.2 oz (133 kg), SpO2 96 %.Body mass index is 45.92  kg/m.  General Appearance: Casual and Fairly Groomed  Eye Contact:  Fair  Speech:  Normal Rate and tired sounding  Volume:  Normal  Mood:  Hopeless and fatigued  Affect:  Congruent and Flat  Thought Process:  Goal Directed and Descriptions of Associations: Intact  Orientation:  Full (Time, Place, and Person)  Thought Content: Logical   Suicidal Thoughts:  No  Homicidal Thoughts:  No  Memory:  Immediate;   Fair  Judgement:  Fair  Insight:  Fair  Psychomotor Activity:  Decreased and Psychomotor Retardation  Concentration:  Attention Span: Fair  Recall:  [employer]  of Knowledge: Fair  Language: Fair  Akathisia:  Negative  Handed:  Right  AIMS (if indicated): not done  Assets:  Communication Skills Desire for Improvement Financial Resources/Insurance Housing  ADL's:  Intact  Cognition: WNL  Sleep:  12 hours, not restful   Screenings: GAD-7     Office Visit from 09/22/2016 in [facility] AT [place]  Total GAD-7 Score  21    PHQ2-9     Office Visit from 09/22/2016 in [facility] AT [place] Office Visit from 04/21/2016 in [facility] AT [place]  PHQ-2 Total Score  5  5  PHQ-9 Total Score  23  21       Assessment and Plan:  Andrew Noble continues to struggle with depressive symptoms towards the end of the month of his injection, he is welcome to use Abilify 5 mg daily for the week leading up to his long-acting injection.  I suspect part of his lack of full remission is due to severe sleep apnea with an AHI of 120.  He was unable to tolerate the full face mask and nasal pillow.  He would unconsciously rip the mask off after less than 1 hour of use at night.  He continues to sleep 12 hours a day without feeling any improvement in his fatigue.  I referred him to pulmonary for additional recommendations if possible.  He may also benefit from an ENT assessment.  With regard to his current fatigue,  weighing the risks and benefits, we have agreed to initiate modafinil for fatigue associated with sleep apnea.  I am hopeful this will at least help him activate to some degree so that he can work on issues of weight loss and day activity, and hopefully in turn be able to reduce the severity of his sleep apnea symptoms.  1. Obstructive sleep apnea   2. Bipolar 1 disorder (HCC)   3. Severe obstructive sleep apnea   4. Sleep-disordered breathing     Status of current problems: unchanged  Labs Ordered: Orders Placed This Encounter  Procedures  . Ambulatory referral to Pulmonology    Referral Priority:   Routine    Referral Type:   Consultation    Referral Reason:   Specialty Services Required    Requested Specialty:   Pulmonary Disease    Number of Visits Requested:   1    Labs Reviewed: reviewded polysomnography  Collateral Obtained/Records Reviewed: n/a  Plan:  Continue Abilify monthly injection Okay for patient to take Abilify 5 mg tablet the last week of his injection cycle if needed Initiate modafinil 100 mg daily for sleep apnea related fatigue Patient has been informed of writer's departure from clinic so that he can be educated and aware and make any necessary decisions regarding his future psychiatric care at the end of August 2019  I spent 30 minutes with the patient in direct face-to-face clinical care.  Greater than 50% of this time was spent in counseling and coordination of care with the patient.    Burnard LeighAlexander Arya Donavan Kerlin, MD 08/04/2017, 8:59 AM

## 2017-08-07 ENCOUNTER — Encounter (HOSPITAL_COMMUNITY): Payer: Self-pay

## 2017-08-07 ENCOUNTER — Ambulatory Visit (INDEPENDENT_AMBULATORY_CARE_PROVIDER_SITE_OTHER): Payer: BLUE CROSS/BLUE SHIELD

## 2017-08-07 DIAGNOSIS — F319 Bipolar disorder, unspecified: Secondary | ICD-10-CM

## 2017-08-07 MED ORDER — ARIPIPRAZOLE ER 400 MG IM PRSY: 400.0000 mg | PREFILLED_SYRINGE | INTRAMUSCULAR | 1 refills | Status: DC

## 2017-08-07 NOTE — Progress Notes (Signed)
Patient presented with flat affect, level mood and denied any suicidal or homicidal ideations, no auditory or visual hallucinations and no other complaints.  Patient reported no longer trying to use a c-pap machine for diagnosed sleep apnea as he was "noncompliant" per his report with using consistently.  Patient denied any other concerns or symptoms today and reported doing okay with Abilify Maintena injections every 4 weeks.  Reported he does feel he does not do as well the last week of medication so at times takes a po Abilify as reports was previously discussed with Dr. Rene KocherEksir.  Patient's due Abilify Maintena 400 mg IM injection prepared as ordered and given to patient in his left deltoid area.  Patient tolerated due injection without complaint of pain or discomfort and agreed to return in 4 weeks for next due injection.  Patient to call if any problems or change in symptoms prior to next due injection.

## 2017-09-04 ENCOUNTER — Ambulatory Visit (INDEPENDENT_AMBULATORY_CARE_PROVIDER_SITE_OTHER): Payer: BLUE CROSS/BLUE SHIELD

## 2017-09-04 DIAGNOSIS — F319 Bipolar disorder, unspecified: Secondary | ICD-10-CM

## 2017-09-04 NOTE — Progress Notes (Signed)
Patient presented with flat affect, level mood and denied any suicidal or homicidal ideations, no auditory or visual hallucinations and no other complaints.Patient denied any other concerns or symptoms today and reported doing okay with Abilify Maintena injections every 4 weeks. Patient's due Abilify Maintena 400 mg IM injection prepared as ordered and given to patient in hisrightdeltoid area. Patient tolerated due injection without complaint of pain or discomfort and agreed to return in 4 weeks for next due injection. Patient to call if any problems or change in symptoms prior to next due injection. 

## 2017-09-18 ENCOUNTER — Telehealth (HOSPITAL_COMMUNITY): Payer: Self-pay

## 2017-09-18 NOTE — Telephone Encounter (Signed)
Patient has an appointment on 09-21-17, but he said that his depression has gotten worse over the month. Patient wants to know what to do until his next appointment. Please advise.

## 2017-09-19 NOTE — Telephone Encounter (Signed)
We can definitely chat when I see him in a couple days. In the meantime, any acute safety concerns, he should go to the ED for psych evaluation and possible admission.

## 2017-09-19 NOTE — Telephone Encounter (Signed)
Called and discussed message below with the patient and he voiced understanding

## 2017-09-21 ENCOUNTER — Encounter (HOSPITAL_COMMUNITY): Payer: Self-pay | Admitting: Psychiatry

## 2017-09-21 ENCOUNTER — Institutional Professional Consult (permissible substitution): Payer: Self-pay | Admitting: Pulmonary Disease

## 2017-09-21 ENCOUNTER — Ambulatory Visit (INDEPENDENT_AMBULATORY_CARE_PROVIDER_SITE_OTHER): Payer: BLUE CROSS/BLUE SHIELD | Admitting: Psychiatry

## 2017-09-21 VITALS — BP 141/90 | HR 71 | Ht 67.0 in | Wt 292.0 lb

## 2017-09-21 DIAGNOSIS — G4733 Obstructive sleep apnea (adult) (pediatric): Secondary | ICD-10-CM | POA: Diagnosis not present

## 2017-09-21 DIAGNOSIS — Z818 Family history of other mental and behavioral disorders: Secondary | ICD-10-CM

## 2017-09-21 DIAGNOSIS — F17210 Nicotine dependence, cigarettes, uncomplicated: Secondary | ICD-10-CM | POA: Diagnosis not present

## 2017-09-21 DIAGNOSIS — F319 Bipolar disorder, unspecified: Secondary | ICD-10-CM

## 2017-09-21 DIAGNOSIS — G4730 Sleep apnea, unspecified: Secondary | ICD-10-CM | POA: Diagnosis not present

## 2017-09-21 DIAGNOSIS — F603 Borderline personality disorder: Secondary | ICD-10-CM

## 2017-09-21 DIAGNOSIS — R45851 Suicidal ideations: Secondary | ICD-10-CM | POA: Diagnosis not present

## 2017-09-21 DIAGNOSIS — F419 Anxiety disorder, unspecified: Secondary | ICD-10-CM | POA: Diagnosis not present

## 2017-09-21 MED ORDER — LITHIUM CARBONATE 300 MG PO TABS: 300.0000 mg | ORAL_TABLET | Freq: Every day | ORAL | 1 refills | Status: DC

## 2017-09-21 MED ORDER — MODAFINIL 100 MG PO TABS: 100.0000 mg | ORAL_TABLET | Freq: Every day | ORAL | 1 refills | Status: DC

## 2017-09-21 NOTE — Progress Notes (Signed)
BH MD/PA/NP OP Progress Note  09/21/2017 11:11 AM Andrew Noble  MRN:  [id number]  Chief Complaint: med management  HPI: Andrew Noble presents 20 minutes late for visit.  Reports that he decided to cancel his pulmonology appointment because his friends said that sleep apnea can be treated by sleeping in a recliner.  I had a very stern conversation with the patient about not listening to his friends about his healthcare, and I once again educated him that his level of sleep apnea is dangerous, places him at risk for heart failure, pulmonary disease, and spontaneous death.  He was receptive and recognize that this is quite serious and he needs to address this with pulmonary.  With regard to his mood, he reports that he continues to have passive suicidal thoughts but no intention to harm himself.  He feels depressed and fatigued chronically.  I spent time with him linking much of his fatigue and depression back to the untreated sleep apnea.  He reports that he can sleep for 12-15 hours a day without feeling rested.   I suggested we start a low-dose of lithium to help with the suicidal thinking, and I educated him on borderline personality disorder and posttraumatic stress disorder, and taught him about the effects and treatments that DBT can have.  He was receptive to starting dialectical behavioral therapy, but has apprehensions because "I have tried therapy before".  I instructed him on how DBT is quite different, and trauma focused therapy is an absolute necessity for his care moving forward.  I provided him a referral to family services of the [facility] for a walk-in assessment this morning.  He agrees to go there to establish care for trauma therapy.  We will follow-up in 4-5 weeks to see how the lithium is going, and if we need to go up on the dose to 600 mg we will check a laboratory study sooner than his follow-up.   I reviewed the risks of lithium including cardiovascular, thyroid, renal,  gastrointestinal, and neurological effects.  I reviewed the benefits of lithium including mood stability, reduction of suicidality, improved mood and depressive symptoms..  I once again reiterated to him that his mood and depressive symptoms will likely not reach substantial improvement without treatment of his sleep apnea.    Visit Diagnosis:    ICD-10-CM   1. Borderline personality disorder (HCC) F60.3 lithium 300 MG tablet  2. Obstructive sleep apnea G47.33 modafinil (PROVIGIL) 100 MG tablet  3. Bipolar 1 disorder (HCC) F31.9 modafinil (PROVIGIL) 100 MG tablet    lithium 300 MG tablet  4. Severe obstructive sleep apnea G47.33 modafinil (PROVIGIL) 100 MG tablet  5. Sleep-disordered breathing G47.30 modafinil (PROVIGIL) 100 MG tablet    Past Psychiatric History: See intake H&P for full details. Reviewed, with no updates at this time.  Past Medical History:  Past Medical History:  Diagnosis Date  . Abnormal laboratory test 02/12/2014  . Anxiety   . Bipolar disorder (HCC)   . Depression   . Hx of substance abuse 02/12/2014  . Psychosis [facility](HCC)     Past Surgical History:  Procedure Laterality Date  . TONSILLECTOMY AND ADENOIDECTOMY      Family Psychiatric History: See intake H&P for full details. Reviewed, with no updates at this time.   Family History:  Family History  Problem Relation Age of Onset  . Depression Mother   . Anxiety disorder Mother   . Bipolar disorder Maternal Aunt     Social History:  Social History  Socioeconomic History  . Marital status: Single    Spouse name: Not on file  . Number of children: Not on file  . Years of education: Not on file  . Highest education level: Not on file  Occupational History  . Not on file  Social Needs  . Financial resource strain: Not on file  . Food insecurity:    Worry: Not on file    Inability: Not on file  . Transportation needs:    Medical: Not on file    Non-medical: Not on file  Tobacco Use  . Smoking  status: Light Tobacco Smoker    Packs/day: 0.25    Types: Cigarettes  . Smokeless tobacco: Never Used  . Tobacco comment: Has cut back to only 1-2 a day and trying to quit  Substance and Sexual Activity  . Alcohol use: Yes    Alcohol/week: 0.6 - 1.2 oz    Types: 1 - 2 Cans of beer per week    Comment: rare  . Drug use: Yes    Types: Marijuana    Comment: no longer using  . Sexual activity: Never  Lifestyle  . Physical activity:    Days per week: Not on file    Minutes per session: Not on file  . Stress: Not on file  Relationships  . Social connections:    Talks on phone: Not on file    Gets together: Not on file    Attends religious service: Not on file    Active member of club or organization: Not on file    Attends meetings of clubs or organizations: Not on file    Relationship status: Not on file  Other Topics Concern  . Not on file  Social History Narrative  . Not on file    Allergies: No Known Allergies  Metabolic Disorder Labs: No results found for: HGBA1C, MPG Lab Results  Component Value Date   PROLACTIN 5.0 04/29/2014   PROLACTIN 46.1 (H) 12/17/2013   No results found for: CHOL, TRIG, HDL, CHOLHDL, VLDL, LDLCALC No results found for: TSH  Therapeutic Level Labs: No results found for: LITHIUM Lab Results  Component Value Date   VALPROATE 84.5 10/31/2014   VALPROATE 83.4 04/29/2014   No components found for:  CBMZ  Current Medications: Current Outpatient Medications  Medication Sig Dispense Refill  . ARIPiprazole ER (ABILIFY MAINTENA) 400 MG PRSY prefilled syringe Inject 400 mg into the muscle every 28 (twenty-eight) days. 1 each 1  . modafinil (PROVIGIL) 100 MG tablet Take 1 tablet (100 mg total) by mouth daily. 30 tablet 1  . pantoprazole (PROTONIX) 20 MG tablet TAKE 1 TABLET (20 MG TOTAL) BY MOUTH 2 TIMES DAILY FOR 30 DAYS.    [name] lithium 300 MG tablet Take 1 tablet (300 mg total) by mouth daily. 30 tablet 1   Current Facility-Administered  Medications  Medication Dose Route Frequency Provider Last Rate Last Dose  . ARIPiprazole ER (ABILIFY MAINTENA) 400 MG prefilled syringe 400 mg  400 mg Intramuscular Q28 days Burnard Leigh, MD   400 mg at 09/04/17 0919     Musculoskeletal: Strength & Muscle Tone: within normal limits Gait & Station: normal Patient leans: N/A  Psychiatric Specialty Exam: ROS  Blood pressure (!) 141/90, pulse 71, height 5\' 7"  (1.702 m), weight 292 lb (132.5 kg).Body mass index is 45.73 kg/m.  General Appearance: Casual and Fairly Groomed  Eye Contact:  Fair  Speech:  Clear and Coherent and Normal Rate  Volume:  Normal  Mood:  Depressed and Dysphoric  Affect:  Congruent  Thought Process:  Coherent and Descriptions of Associations: Intact  Orientation:  Full (Time, Place, and Person)  Thought Content: Logical   Suicidal Thoughts:  Yes.  without intent/plan  Homicidal Thoughts:  No  Memory:  Immediate;   Fair  Judgement:  Fair  Insight:  Fair  Psychomotor Activity:  Normal  Concentration:  Concentration: Good  Recall:  Good  Fund of Knowledge: Good  Language: Good  Akathisia:  Negative  Handed:  Right  AIMS (if indicated): not done  Assets:  Special educational needs teacher  ADL's:  Intact  Cognition: WNL  Sleep:  Poor   Screenings: GAD-7     Office Visit from 09/22/2016 in [facility] AT [facility]  Total GAD-7 Score  21    PHQ2-9     Office Visit from 09/22/2016 in [facility] AT [place] Office Visit from 04/21/2016 in [facility] AT   PHQ-2 Total Score  5  5  PHQ-9 Total Score  23  21       Assessment and Plan:  Andrew Noble presents for med management follow-up.  I spent time today educating him on the diagnosis of borderline personality disorder comorbid with PTSD and bipolar disorder.  Spent time educating him on the importance of following  through with treatment for sleep apnea given the severity of his disease.  I educated him on the physical manifestations of untreated sleep apnea including the risk of spontaneous nocturnal death.  He denies any acute suicidal plans or intentions, and has not been engaging in any substance abuse or self-injurious behaviors.  He reports that he has been working every day, but he continues to feel very fatigued and his sleep is not restful.  I suggested we initiate lithium low-dose for mood stability and reduction of suicidality, and instructed him to please schedule a consult with pulmonary for very necessary addressing of sleep apnea symptoms.    He had an appointment with pulmonary today but canceled because a friend told him that he can sleep in a recliner and his apnea would get better.  I instructed the patient that this is completely and appropriate to receive medical advice about his health care from a friend, that his friend is incorrect, and the severity of his sleep apnea requires treatment with a CPAP, and in some severe cases has progressed to requiring tracheostomy.  I encouraged him to immediately reached out to pulmonary to schedule this very necessary medical intervention.   1. Borderline personality disorder (HCC)   2. Obstructive sleep apnea   3. Bipolar 1 disorder (HCC)   4. Severe obstructive sleep apnea   5. Sleep-disordered breathing     Status of current problems: unchanged  Labs Ordered: No orders of the defined types were placed in this encounter.   Labs Reviewed: n/a  Collateral Obtained/Records Reviewed: n/a  Plan:  Continue Abilify maintaina Initiate lithium 300 mg nightly Patient has been referred to [place] [facility] for DBT Continue Provigil 100 mg Return to clinic in 4-5 weeks  Burnard Leigh, MD 09/21/2017, 11:11 AM

## 2017-09-26 DIAGNOSIS — F419 Anxiety disorder, unspecified: Secondary | ICD-10-CM | POA: Diagnosis not present

## 2017-09-28 ENCOUNTER — Encounter (HOSPITAL_COMMUNITY): Payer: Self-pay | Admitting: Psychiatry

## 2017-10-02 DIAGNOSIS — R45851 Suicidal ideations: Secondary | ICD-10-CM | POA: Diagnosis not present

## 2017-10-02 DIAGNOSIS — D72825 Bandemia: Secondary | ICD-10-CM | POA: Diagnosis not present

## 2017-10-02 DIAGNOSIS — R945 Abnormal results of liver function studies: Secondary | ICD-10-CM | POA: Diagnosis not present

## 2017-10-02 DIAGNOSIS — F3181 Bipolar II disorder: Secondary | ICD-10-CM | POA: Diagnosis not present

## 2017-10-02 DIAGNOSIS — F319 Bipolar disorder, unspecified: Secondary | ICD-10-CM | POA: Diagnosis not present

## 2017-10-02 DIAGNOSIS — F329 Major depressive disorder, single episode, unspecified: Secondary | ICD-10-CM | POA: Diagnosis not present

## 2017-10-03 DIAGNOSIS — F329 Major depressive disorder, single episode, unspecified: Secondary | ICD-10-CM | POA: Diagnosis not present

## 2017-10-03 DIAGNOSIS — F3181 Bipolar II disorder: Secondary | ICD-10-CM | POA: Diagnosis not present

## 2017-10-03 DIAGNOSIS — F319 Bipolar disorder, unspecified: Secondary | ICD-10-CM | POA: Diagnosis not present

## 2017-10-03 DIAGNOSIS — R45851 Suicidal ideations: Secondary | ICD-10-CM | POA: Diagnosis not present

## 2017-10-04 ENCOUNTER — Encounter (HOSPITAL_COMMUNITY): Payer: Self-pay | Admitting: Emergency Medicine

## 2017-10-04 ENCOUNTER — Emergency Department (HOSPITAL_COMMUNITY)
Admission: EM | Admit: 2017-10-04 | Discharge: 2017-10-05 | Disposition: A | Discharge status: Other Acute Inpatient Hospital | Payer: BLUE CROSS/BLUE SHIELD | Attending: Emergency Medicine | Admitting: Emergency Medicine

## 2017-10-04 DIAGNOSIS — Z008 Encounter for other general examination: Secondary | ICD-10-CM | POA: Insufficient documentation

## 2017-10-04 DIAGNOSIS — F17210 Nicotine dependence, cigarettes, uncomplicated: Secondary | ICD-10-CM | POA: Diagnosis not present

## 2017-10-04 DIAGNOSIS — F314 Bipolar disorder, current episode depressed, severe, without psychotic features: Secondary | ICD-10-CM | POA: Insufficient documentation

## 2017-10-04 DIAGNOSIS — R45851 Suicidal ideations: Secondary | ICD-10-CM | POA: Insufficient documentation

## 2017-10-04 DIAGNOSIS — Z79899 Other long term (current) drug therapy: Secondary | ICD-10-CM | POA: Insufficient documentation

## 2017-10-04 DIAGNOSIS — F329 Major depressive disorder, single episode, unspecified: Secondary | ICD-10-CM | POA: Diagnosis not present

## 2017-10-04 LAB — COMPREHENSIVE METABOLIC PANEL
ALBUMIN: 4.7 g/dL (ref 3.5–5.0)
ALT: 83 U/L — ABNORMAL HIGH (ref 17–63)
ANION GAP: 8 (ref 5–15)
AST: 48 U/L — ABNORMAL HIGH (ref 15–41)
Alkaline Phosphatase: 80 U/L (ref 38–126)
BILIRUBIN TOTAL: 0.8 mg/dL (ref 0.3–1.2)
BUN: 11 mg/dL (ref 6–20)
CALCIUM: 10 mg/dL (ref 8.9–10.3)
CO2: 27 mmol/L (ref 22–32)
Chloride: 106 mmol/L (ref 101–111)
Creatinine, Ser: 1.09 mg/dL (ref 0.61–1.24)
GFR calc Af Amer: >60 mL/min (ref >60)
GLUCOSE: 103 mg/dL — AB (ref 65–99)
Potassium: 4.3 mmol/L (ref 3.5–5.1)
Sodium: 141 mmol/L (ref 135–145)
TOTAL PROTEIN: 7.6 g/dL (ref 6.5–8.1)

## 2017-10-04 LAB — ACETAMINOPHEN LEVEL

## 2017-10-04 LAB — CBC
HEMATOCRIT: 52.3 % — AB (ref 39.0–52.0)
Hemoglobin: 18.8 g/dL — ABNORMAL HIGH (ref 13.0–17.0)
MCH: 32.9 pg (ref 26.0–34.0)
MCHC: 35.9 g/dL (ref 30.0–36.0)
MCV: 91.4 fL (ref 78.0–100.0)
Platelets: 325 10*3/uL (ref 150–400)
RBC: 5.72 MIL/uL (ref 4.22–5.81)
RDW: 12.5 % (ref 11.5–15.5)
WBC: 12 10*3/uL — ABNORMAL HIGH (ref 4.0–10.5)

## 2017-10-04 LAB — RAPID URINE DRUG SCREEN, HOSP PERFORMED
Amphetamines: NOT DETECTED
Benzodiazepines: NOT DETECTED
COCAINE: NOT DETECTED
Opiates: NOT DETECTED
Tetrahydrocannabinol: NOT DETECTED

## 2017-10-04 LAB — ETHANOL: Alcohol, Ethyl (B): <10 mg/dL (ref <10)

## 2017-10-04 LAB — SALICYLATE LEVEL: Salicylate Lvl: <7 mg/dL (ref 2.8–30.0)

## 2017-10-04 MED ORDER — FLUOXETINE HCL 10 MG PO CAPS: 10.0000 mg | ORAL_CAPSULE | Freq: Every day | ORAL | Status: DC

## 2017-10-04 MED ORDER — LITHIUM CARBONATE ER 300 MG PO TBCR: 300.0000 mg | EXTENDED_RELEASE_TABLET | Freq: Every day | ORAL | Status: DC

## 2017-10-04 MED ORDER — MODAFINIL 200 MG PO TABS: 100.0000 mg | ORAL_TABLET | Freq: Every day | ORAL | Status: DC

## 2017-10-04 NOTE — ED Notes (Signed)
Patient wanded by security. 

## 2017-10-04 NOTE — ED Provider Notes (Signed)
[facility]-EMERGENCY DEPT Provider Note   CSN: [id number] Arrival date & time: 10/04/17  1931     History   Chief Complaint Chief Complaint  Patient presents with  . Suicidal  . Depression    HPI Andrew Noble is a 22 y.o. male with history of sleep apnea, bipolar 1 disorder, borderline personality disorder followed by psychiatry here for evaluation of persistent depressive mood and suicidal thoughts.  Ongoing for 2 weeks.  States today while at work thoughts became too much and worse than in the last 2 weeks, he was scared because of the strong thoughts to hurt himself and came to the ER.  States he has been thinking about how he would do it and he thinks he would do something that would be quick such as shooting himself or driving off a cliff.  He was seen at outside hospital 2 days ago and was told that "I am just depressed".  States that he is tired of feeling this way.  Recently started lithium 2 to 3 weeks ago and Prozac a couple of days ago.  Denies any homicidal thoughts, auditory or visual hallucinations.  He denies EtOH use.  Denies illicit drug use.  He offers no other complaints.  Patient lives alone.  Does not own any guns.  HPI  Past Medical History:  Diagnosis Date  . Abnormal laboratory test 02/12/2014  . Anxiety   . Bipolar disorder (HCC)   . Depression   . Hx of substance abuse 02/12/2014  . Psychosis [facility](HCC)     Patient Active Problem List   Diagnosis Date Noted  . Elevated liver enzymes 09/24/2015  . Obesity 09/24/2015  . Generalized anxiety disorder 03/05/2015  . Bipolar disorder with depression (HCC)   . Bipolar 1 disorder (HCC) 10/11/2013  . Mood disorder (HCC) 05/20/2011  . History of eating disorder 05/20/2011    Past Surgical History:  Procedure Laterality Date  . TONSILLECTOMY AND ADENOIDECTOMY          Home Medications    Prior to Admission medications   Medication Sig Start Date End Date Taking? Authorizing Provider   ARIPiprazole ER (ABILIFY MAINTENA) 400 MG PRSY prefilled syringe Inject 400 mg into the muscle every 28 (twenty-eight) days. 08/07/17  Yes Eksir, Bo McclintockAlexander Arya, MD  FLUoxetine (PROZAC) 10 MG capsule Take 10 mg by mouth daily. 10/03/17 01/01/18 Yes [provider]  lithium 300 MG tablet Take 1 tablet (300 mg total) by mouth daily. 09/21/17 09/21/18 Yes Eksir, Bo McclintockAlexander Arya, MD  modafinil (PROVIGIL) 100 MG tablet Take 1 tablet (100 mg total) by mouth daily. 09/21/17  Yes Eksir, Bo McclintockAlexander Arya, MD    Family History Family History  Problem Relation Age of Onset  . Depression Mother   . Anxiety disorder Mother   . Bipolar disorder Maternal Aunt     Social History Social History   Tobacco Use  . Smoking status: Light Tobacco Smoker    Packs/day: 0.25    Types: Cigarettes  . Smokeless tobacco: Never Used  . Tobacco comment: Has cut back to only 1-2 a day and trying to quit  Substance Use Topics  . Alcohol use: Yes    Alcohol/week: 0.6 - 1.2 oz    Types: 1 - 2 Cans of beer per week    Comment: rare  . Drug use: Yes    Types: Marijuana    Comment: no longer using     Allergies   Patient has no known allergies.  Review of Systems Review of Systems  Psychiatric/Behavioral: Positive for dysphoric mood and suicidal ideas.  All other systems reviewed and are negative.    Physical Exam Updated Vital Signs BP (!) 159/91 (BP Location: Left Arm)   Pulse (!) 105   Temp 98.7 F (37.1 C) (Oral)   Resp 18   SpO2 96%   Physical Exam  Constitutional: He is oriented to person, place, and time. He appears well-developed and well-nourished. No distress.  NAD.  HENT:  Head: Normocephalic and atraumatic.  Right Ear: External ear normal.  Left Ear: External ear normal.  Nose: Nose normal.  Eyes: Conjunctivae and EOM are normal. No scleral icterus.  Neck: Normal range of motion. Neck supple.  Cardiovascular: Normal rate, regular rhythm and normal heart sounds.  Pulmonary/Chest:  Effort normal and breath sounds normal.  Musculoskeletal: Normal range of motion. He exhibits no deformity.  Neurological: He is alert and oriented to person, place, and time.  Skin: Skin is warm and dry. Capillary refill takes less than 2 seconds.  Psychiatric: He expresses inappropriate judgment. He exhibits a depressed mood. He expresses suicidal ideation.  Blunt. Active SI. No HI or AVH.  Nursing note and vitals reviewed.    ED Treatments / Results  Labs (all labs ordered are listed, but only abnormal results are displayed) Labs Reviewed  COMPREHENSIVE METABOLIC PANEL - Abnormal; Notable for the following components:      Result Value   Glucose, Bld 103 (*)    AST 48 (*)    ALT 83 (*)    All other components within normal limits  ACETAMINOPHEN LEVEL - Abnormal; Notable for the following components:   Acetaminophen (Tylenol), Serum <10 (*)    All other components within normal limits  CBC - Abnormal; Notable for the following components:   WBC 12.0 (*)    Hemoglobin 18.8 (*)    HCT 52.3 (*)    All other components within normal limits  RAPID URINE DRUG SCREEN, HOSP PERFORMED - Abnormal; Notable for the following components:   Barbiturates   (*)    Value: Result not available. Reagent lot number recalled by manufacturer.   All other components within normal limits  ETHANOL  SALICYLATE LEVEL    EKG None  Radiology No results found.  Procedures Procedures (including critical care time)  Medications Ordered in ED Medications  FLUoxetine (PROZAC) capsule 10 mg (10 mg Oral Not Given 10/04/17 2140)  lithium carbonate (LITHOBID) CR tablet 300 mg (has no administration in time range)  modafinil (PROVIGIL) tablet 100 mg (100 mg Oral Not Given 10/04/17 2140)     Initial Impression / Assessment and Plan / ED Course  I have reviewed the triage vital signs and the nursing notes.  Pertinent labs & imaging results that were available during my care of the patient were  reviewed by me and considered in my medical decision making (see chart for details).    Pt here with active suicidal ideation has began to considering a specific plan. Has previous psychiatric history and currently compliant on home medication regimen. Recently started on lithium and prozac. No illicit drug or ETOH abuse. Exam is unremarkable. Pt is withdrawn and suicidal. Has been cooperative in ED. Labs reviewed and WNL. He is medically cleared for psychiatric evaluation. Sitter at bedside. Home medications ordered. Discussed ED course and upcoming psych evaluation with patient who is agreeable to stay voluntarily.   Final Clinical Impressions(s) / ED Diagnoses   2238:TTS notes recommending inpatient psych tx.  Final diagnoses:  Suicidal ideations    ED Discharge Orders    None       Jerrell Mylar 10/04/17 2239    Charlynne Pander, MD 10/04/17 [phone]

## 2017-10-04 NOTE — ED Notes (Signed)
Pt presents with depression and SI increasing in severity x 2 weeks, plan to stab or shoot self.  Denies SI in the past.  Feeling hopeless.  Denies HI or AVH.  Denies Drugs or alcohol and states he takes his meds everyday.  A&O x 3, no distress noted, calm & cooperative, monitoring for safety, Q 15 min checks in effect.

## 2017-10-04 NOTE — ED Triage Notes (Addendum)
Patient reports worsening depression x2 weeks. Reports plan to stab himself. States "I am just tired of the highs and lows." Reports taking psych meds as prescribed. States [facility]. Denies HI/A/V/H.

## 2017-10-04 NOTE — ED Notes (Signed)
[facility] Expected date:  Expected time:  Means of arrival:  Comments: Drue SecondSnider

## 2017-10-04 NOTE — BH Assessment (Addendum)
Assessment Note  Andrew Noble is an 22 y.o. male who presents voluntarily. Per Sharen Heck, PA, "Andrew Noble is a 22 y.o. male with history of sleep apnea, bipolar 1 disorder, borderline personality disorder followed by psychiatry here for evaluation of persistent depressive mood and suicidal thoughts.  Ongoing for 2 weeks.  States today while at work thoughts became too much and worse than in the last 2 weeks, he was scared because of the strong thoughts to hurt himself and came to the ER.  States he has been thinking about how he would do it and he thinks he would do something that would be quick such as shooting himself or driving off a cliff.  He was seen at outside hospital 2 days ago and was told that "I am just depressed".  States that he is tired of feeling this way.  Recently started lithium 2 to 3 weeks ago and Prozac a couple of days ago.  Denies any homicidal thoughts, auditory or visual hallucinations.  He denies EtOH use.  Denies illicit drug use.  He offers no other complaints.  Patient lives alone.  Does not own any guns".  Pt states that he has been a pt of Dr. Susy Manor at [facility] for about 2 years. He states that his sleep apnea significantly impairs his sleep, and that his CPAP machine "does not work--does not help". Pt cannot identify any particular primary stressors.  Pt identifies primary residence as alone and stable. Pt identifies primary supports as his mom and some friends from work.  Pt states work history includes currently working at [employer]. Pt denies legal involvement. Pt denies abuse history.  Pt reports medication compliance. Pt has fair insight and judgment. Pt's memory is typical .?  MSE: Pt is casually dressed, alert, oriented x4 with normal speech and normal motor behavior. Eye contact is fair. Pt's mood is depressed and affect is depressed and anxious. Affect is congruent with mood. Thought process is coherent and relevant. There is no indication that pt  is currently responding to internal stimuli or experiencing delusional thought content. Pt was cooperative throughout assessment.   Nira Conn, FNP recommended inpatient psychiatric treatment.  Per Bunnie Pion, pt accepted to [facility] [id number], can come tonight.  Diagnosis: (Per History in chart) F31.4 Bipolar depressed severe, without psychosis   Past Medical History:  Past Medical History:  Diagnosis Date  . Abnormal laboratory test 02/12/2014  . Anxiety   . Bipolar disorder (HCC)   . Depression   . Hx of substance abuse 02/12/2014  . Psychosis [facility])     Past Surgical History:  Procedure Laterality Date  . TONSILLECTOMY AND ADENOIDECTOMY      Family History:  Family History  Problem Relation Age of Onset  . Depression Mother   . Anxiety disorder Mother   . Bipolar disorder Maternal Aunt     Social History:  reports that he has been smoking cigarettes.  He has been smoking about 0.25 packs per day. He has never used smokeless tobacco. He reports that he drinks about 0.6 - 1.2 oz of alcohol per week. He reports that he has current or past drug history. Drug: Marijuana.  Additional Social History:  Alcohol / Drug Use Pain Medications: denies Prescriptions: denies Over the Counter: denies History of alcohol / drug use?: No history of alcohol / drug abuse Longest period of sobriety (when/how long): denies Negative Consequences of Use: (denies)  CIWA: CIWA-Ar BP: (!) 159/91 Pulse Rate: (!) 105 COWS:  Allergies: No Known Allergies  Home Medications:  (Not in a hospital admission)  OB/GYN Status:  No LMP for male patient.  General Assessment Data Location of Assessment: [facility] TTS Assessment: In system Is this a Tele or Face-to-Face Assessment?: Face-to-Face Is this an Initial Assessment or a Re-assessment for this encounter?: Initial Assessment Marital status: Single Living Arrangements: Alone Can pt return to current living arrangement?: Yes Admission Status:  Voluntary Is patient capable of signing voluntary admission?: Yes Referral Source: Self/Family/Friend Insurance type: BCBS     Crisis Care Plan Living Arrangements: Alone Name of Psychiatrist: [facility]  Education Status Is patient currently in school?: No Is the patient employed, unemployed or receiving disability?: Employed  Risk to self with the past 6 months Suicidal Ideation: Yes-Currently Present Has patient been a risk to self within the past 6 months prior to admission? : No Suicidal Intent: No Has patient had any suicidal intent within the past 6 months prior to admission? : No Is patient at risk for suicide?: Yes Suicidal Plan?: Yes-Currently Present Has patient had any suicidal plan within the past 6 months prior to admission? : Yes Specify Current Suicidal Plan: shooting self, driving off a cliff Access to Means: Yes Specify Access to Suicidal Means: environment, car What has been your use of drugs/alcohol within the last 12 months?: denies Previous Attempts/Gestures: No Intentional Self Injurious Behavior: None Family Suicide History: Unknown Recent stressful life event(s): (none known) Persecutory voices/beliefs?: No Depression: Yes Depression Symptoms: Despondent, Insomnia, Isolating, Fatigue, Loss of interest in usual pleasures, Feeling worthless/self pity Substance abuse history and/or treatment for substance abuse?: No Suicide prevention information given to non-admitted patients: Not applicable  Risk to Others within the past 6 months Homicidal Ideation: No Does patient have any lifetime risk of violence toward others beyond the six months prior to admission? : Yes (comment)(towards dad when a teen) Thoughts of Harm to Others: No Current Homicidal Intent: No Current Homicidal Plan: No Access to Homicidal Means: No History of harm to others?: No Assessment of Violence: None Noted Does patient have access to weapons?: No Criminal Charges Pending?: No Does  patient have a court date: No Is patient on probation?: No  Psychosis Hallucinations: None noted Delusions: None noted  Mental Status Report Appearance/Hygiene: Unremarkable Eye Contact: Fair Motor Activity: Freedom of movement Speech: Logical/coherent Level of Consciousness: Alert Mood: Depressed, Anxious, Irritable Affect: Blunted, Depressed Anxiety Level: Moderate Thought Processes: Coherent, Relevant Judgement: Unimpaired Orientation: Person, Place, Time, Situation, Appropriate for developmental age Obsessive Compulsive Thoughts/Behaviors: None  Cognitive Functioning Concentration: Fair Memory: Recent Intact, Remote Intact Is patient IDD: No Is patient DD?: No Insight: Fair Impulse Control: Fair Appetite: Fair Have you had any weight changes? : No Change Sleep: Decreased Total Hours of Sleep: (very few) Vegetative Symptoms: Decreased grooming  ADLScreening [facility] Assessment Services) Patient's cognitive ability adequate to safely complete daily activities?: Yes Patient able to express need for assistance with ADLs?: Yes Independently performs ADLs?: Yes (appropriate for developmental age)  Prior Inpatient Therapy Prior Inpatient Therapy: Yes Prior Therapy Dates: (a few years ago and as an adolescent) Prior Therapy Facilty[facility]Provider(s): [facility],  [place] Reason for Treatment: depression, eating disorder as adolescent  Prior Outpatient Therapy Prior Outpatient Therapy: Yes Prior Therapy Dates: 2 years Prior Therapy Facilty/Provider(s): [facility] Reason for Treatment: depression Does patient have an ACCT team?: No Does patient have Intensive In-House Services?  : No Does patient have [facility] services? : No Does patient have P4CC services?: No  ADL Screening (condition  at time of admission) Patient's cognitive ability adequate to safely complete daily activities?: Yes Is the patient deaf or have difficulty hearing?: No Does the patient have difficulty seeing, even  when wearing glasses/contacts?: No Does the patient have difficulty concentrating, remembering, or making decisions?: No Patient able to express need for assistance with ADLs?: Yes Does the patient have difficulty dressing or bathing?: No Independently performs ADLs?: Yes (appropriate for developmental age) Does the patient have difficulty walking or climbing stairs?: No Weakness of Legs: None Weakness of Arms/Hands: None  Home Assistive Devices/Equipment Home Assistive Devices/Equipment: None  Therapy Consults (therapy consults require a physician order) PT Evaluation Needed: No OT Evalulation Needed: No SLP Evaluation Needed: No Abuse/Neglect Assessment (Assessment to be complete while patient is alone) Abuse/Neglect Assessment Can Be Completed: Yes Physical Abuse: Denies Verbal Abuse: Denies Sexual Abuse: Denies Exploitation of patient/patient's resources: Denies Self-Neglect: Denies Values / Beliefs Cultural Requests During Hospitalization: None Spiritual Requests During Hospitalization: None Consults Spiritual Care Consult Needed: No Social Work Consult Needed: No Merchant navy officer (For Healthcare) Does Patient Have a Medical Advance Directive?: No Would patient like information on creating a medical advance directive?: No - Patient declined    Additional Information 1:1 In Past 12 Months?: No CIRT Risk: No Elopement Risk: No Does patient have medical clearance?: Yes     Disposition:  Disposition Initial Assessment Completed for this Encounter: Yes Disposition of Patient: Admit Type of inpatient treatment program: Adult  On Site Evaluation by:   Reviewed with Physician:    Theo Dills 10/04/2017 9:55 PM

## 2017-10-04 NOTE — ED Provider Notes (Signed)
[facility]  Pt is medically stable for transfer.   Linwood DibblesKnapp, Kazuo Durnil, MD 10/04/17 2255

## 2017-10-05 ENCOUNTER — Ambulatory Visit (HOSPITAL_COMMUNITY): Payer: BLUE CROSS/BLUE SHIELD

## 2017-10-05 ENCOUNTER — Encounter (HOSPITAL_COMMUNITY): Payer: Self-pay

## 2017-10-05 ENCOUNTER — Other Ambulatory Visit: Payer: Self-pay

## 2017-10-05 ENCOUNTER — Inpatient Hospital Stay (HOSPITAL_COMMUNITY)
Admission: AD | Admit: 2017-10-05 | Discharge: 2017-10-09 | DRG: 885 | Disposition: A | Discharge status: Home / Self Care | Payer: BLUE CROSS/BLUE SHIELD | Source: Intra-hospital | Attending: Psychiatry | Admitting: Psychiatry

## 2017-10-05 DIAGNOSIS — Z818 Family history of other mental and behavioral disorders: Secondary | ICD-10-CM

## 2017-10-05 DIAGNOSIS — F4310 Post-traumatic stress disorder, unspecified: Secondary | ICD-10-CM | POA: Diagnosis present

## 2017-10-05 DIAGNOSIS — R45851 Suicidal ideations: Secondary | ICD-10-CM | POA: Diagnosis present

## 2017-10-05 DIAGNOSIS — F314 Bipolar disorder, current episode depressed, severe, without psychotic features: Secondary | ICD-10-CM | POA: Diagnosis not present

## 2017-10-05 DIAGNOSIS — G4700 Insomnia, unspecified: Secondary | ICD-10-CM | POA: Diagnosis not present

## 2017-10-05 DIAGNOSIS — F17210 Nicotine dependence, cigarettes, uncomplicated: Secondary | ICD-10-CM | POA: Diagnosis present

## 2017-10-05 DIAGNOSIS — F603 Borderline personality disorder: Secondary | ICD-10-CM | POA: Diagnosis not present

## 2017-10-05 MED ORDER — TRAZODONE HCL 50 MG PO TABS
50.0000 mg | ORAL_TABLET | Freq: Every evening | ORAL | Status: DC | PRN
  Administered 2017-10-08: 50 mg via ORAL
  Filled 2017-10-05: qty 1

## 2017-10-05 MED ORDER — ACETAMINOPHEN 325 MG PO TABS: 650.0000 mg | ORAL_TABLET | Freq: Four times a day (QID) | ORAL | Status: DC | PRN

## 2017-10-05 MED ORDER — LITHIUM CARBONATE 300 MG PO CAPS
600.0000 mg | ORAL_CAPSULE | Freq: Every day | ORAL | Status: DC
  Administered 2017-10-05 – 2017-10-08 (×4): 600 mg via ORAL
  Filled 2017-10-05 (×7): qty 2

## 2017-10-05 MED ORDER — GABAPENTIN 100 MG PO CAPS
100.0000 mg | ORAL_CAPSULE | Freq: Three times a day (TID) | ORAL | Status: DC
  Administered 2017-10-05 – 2017-10-09 (×11): 100 mg via ORAL
  Filled 2017-10-05 (×19): qty 1

## 2017-10-05 MED ORDER — LITHIUM CARBONATE 300 MG PO CAPS
300.0000 mg | ORAL_CAPSULE | Freq: Every day | ORAL | Status: DC
  Administered 2017-10-05: 300 mg via ORAL
  Filled 2017-10-05 (×2): qty 1

## 2017-10-05 MED ORDER — MAGNESIUM HYDROXIDE 400 MG/5ML PO SUSP: 30.0000 mL | Freq: Every day | ORAL | Status: DC | PRN

## 2017-10-05 MED ORDER — ALUM & MAG HYDROXIDE-SIMETH 200-200-20 MG/5ML PO SUSP: 30.0000 mL | ORAL | Status: DC | PRN

## 2017-10-05 MED ORDER — FLUOXETINE HCL 10 MG PO CAPS
10.0000 mg | ORAL_CAPSULE | Freq: Every day | ORAL | Status: DC
  Administered 2017-10-05 – 2017-10-06 (×2): 10 mg via ORAL
  Filled 2017-10-05 (×3): qty 1

## 2017-10-05 MED ORDER — MODAFINIL 200 MG PO TABS
100.0000 mg | ORAL_TABLET | Freq: Every day | ORAL | Status: DC
  Administered 2017-10-05 – 2017-10-09 (×5): 100 mg via ORAL
  Filled 2017-10-05 (×5): qty 1

## 2017-10-05 MED ORDER — ENSURE ENLIVE PO LIQD
237.0000 mL | Freq: Two times a day (BID) | ORAL | Status: DC
  Administered 2017-10-06: 237 mL via ORAL

## 2017-10-05 NOTE — [facility] Suicide Risk Assessment (Signed)
[facility]   Nursing information obtained from:  Patient Demographic factors:  Male, Living alone, Caucasian, Adolescent or young adult Current Mental Status:  Suicidal ideation indicated by patient, Suicide plan, Self-harm thoughts, Intention to act on suicide plan Loss Factors:  NA Historical Factors:  Impulsivity Risk Reduction Factors:  Employed, Positive social support  Total Time spent with patient: 45 minutes Principal Problem: <principal problem not specified> Diagnosis:   Patient Active Problem List   Diagnosis Date Noted  . Bipolar 1 disorder, depressed, severe (HCC) [F31.4] 10/05/2017  . Elevated liver enzymes [R74.8] 09/24/2015  . Obesity [E66.9] 09/24/2015  . Generalized anxiety disorder [F41.1] 03/05/2015  . Bipolar disorder with depression (HCC) [F31.9]   . Bipolar 1 disorder (HCC) [F31.9] 10/11/2013  . Mood disorder (HCC) [F39] 05/20/2011  . History of eating disorder [Z86.59] 05/20/2011   Subjective Data: Patient is seen and examined.  Patient is a 22 year old male with a past psychiatric history significant for borderline personality disorder versus bipolar disorder, posttraumatic stress disorder, and chronic suicidal thoughts.  Patient stated that he had been feeling depressed more significantly over the last 2 weeks.  His thoughts about harming himself increased significantly.  The patient stated that he had had suicidal thoughts basically all the time, but it worsened recently.  He has seen his outpatient psychiatrist couple weeks ago who started lithium 300 mg p.o. daily.  The patient was unsure whether or not this had been beneficial.  He presented to a [facility] and was evaluated in the emergency room on 6/16.  Fluoxetine 10 mg p.o. daily was added at that time.  Unfortunately a lithium level was not obtained.  Things continue to worsen over the next 24 hours.  He began to think that he would shoot himself or drive off a cliff.  [facility].  He admitted to suicidal ideation, worsening mood, feeling helpless, hopeless and worthless.  The decision was made to admit him to the hospital for evaluation and stabilization.  Continued Clinical Symptoms:  Alcohol Use Disorder Identification Test Final Score (AUDIT): 0 The "Alcohol Use Disorders Identification Test", Guidelines for Use in Primary Care, Second Edition.  World Science writerHealth Organization [facility](WHO). Score between 0-7:  no or low risk or alcohol related problems. Score between 8-15:  moderate risk of alcohol related problems. Score between 16-19:  high risk of alcohol related problems. Score 20 or above:  warrants further diagnostic evaluation for alcohol dependence and treatment.   CLINICAL FACTORS:   Bipolar Disorder:   Depressive phase Personality Disorders:   Cluster B More than one psychiatric diagnosis   Musculoskeletal: Strength & Muscle Tone: within normal limits Gait & Station: normal Patient leans: N/A  Psychiatric Specialty Exam: Physical Exam  Nursing note and vitals reviewed. Constitutional: He is oriented to person, place, and time. He appears well-developed and well-nourished.  HENT:  Head: Normocephalic and atraumatic.  Respiratory: Effort normal.  Neurological: He is alert and oriented to person, place, and time.    ROS  Blood pressure (!) 146/64, pulse 93, temperature 98.2 F (36.8 C), temperature source Oral, resp. rate 16, height 5\' 7"  (1.702 m), weight 59.9 kg (132 lb).Body mass index is 20.67 kg/m.  General Appearance: Casual  Eye Contact:  Fair  Speech:  Normal Rate  Volume:  Decreased  Mood:  Depressed  Affect:  Flat  Thought Process:  Coherent  Orientation:  Full (Time, Place, and Person)  Thought  Content:  Logical  Suicidal Thoughts:  Yes.  without intent/plan  Homicidal Thoughts:  No  Memory:  Immediate;   Fair Recent;   Fair Remote;   Fair  Judgement:  Impaired   Insight:  Fair  Psychomotor Activity:  Psychomotor Retardation  Concentration:  Concentration: Fair and Attention Span: Fair  Recall:  [employer] of Knowledge:  Fair  Language:  Good  Akathisia:  Negative  Handed:  Right  AIMS (if indicated):     Assets:  Desire for Improvement Financial Resources/Insurance Housing Physical Health Resilience  ADL's:  Intact  Cognition:  WNL  Sleep:  Number of Hours: 2.5      COGNITIVE FEATURES THAT CONTRIBUTE TO RISK:  None    SUICIDE RISK:   Mild:  Suicidal ideation of limited frequency, intensity, duration, and specificity.  There are no identifiable plans, no associated intent, mild dysphoria and related symptoms, good self-control (both objective and subjective assessment), few other risk factors, and identifiable protective factors, including available and accessible social support.  PLAN OF CARE: Patient is seen and examined.  Patient is a 22 year old male with the above-stated past psychiatric history who was admitted for worsening suicidal ideation.  He will be admitted to the hospital.  He will be integrated into the milieu.  He will be monitored every 15 minutes for safety checks.  He will meet with social work individually as well as in groups.  He will be encouraged to attend groups.  We will work on Pharmacologist.  He was placed on lithium approximately 2 weeks ago.  He was on 300 mg p.o. daily, I am going to increase that dose to 600 mg, but change it to nightly.  He is supposed to get his Abilify injection today, but he really does not feel as though it been of any benefit.  I am going to hold off on that for now.  They did not obtain a lithium level yesterday, and one was not obtained when he was in the emergency room at [facility] 2 days ago.  We will order one for the a.m.  We will continue the fluoxetine at 10 mg p.o. daily.  I am also going to add Neurontin 100 mg p.o. 3 times daily for anxiety at this point.  The modafinil will be  continued.  I certify that inpatient services furnished can reasonably be expected to improve the patient's condition.   Antonieta Pert, MD 10/05/2017, 10:17 AM

## 2017-10-05 NOTE — Progress Notes (Signed)
Pt attended group this evening. 

## 2017-10-05 NOTE — [facility] Group Notes (Signed)
[facility] LCSW Group Therapy Note  Date/Time: 10/05/17, 1315  Type of Therapy/Topic:  Group Therapy:  Balance in Life  Participation Level:  active  Description of Group:    This group will address the concept of balance and how it feels and looks when one is unbalanced. Patients will be encouraged to process areas in their lives that are out of balance, and identify reasons for remaining unbalanced. Facilitators will guide patients utilizing problem- solving interventions to address and correct the stressor making their life unbalanced. Understanding and applying boundaries will be explored and addressed for obtaining  and maintaining a balanced life. Patients will be encouraged to explore ways to assertively make their unbalanced needs known to significant others in their lives, using other group members and facilitator for support and feedback.  Therapeutic Goals: 1. Patient will identify two or more emotions or situations they have that consume much of in their lives. 2. Patient will identify signs/triggers that life has become out of balance:  3. Patient will identify two ways to set boundaries in order to achieve balance in their lives:  4. Patient will demonstrate ability to communicate their needs through discussion and/or role plays  Summary of Patient Progress:Pt shared that physical, financial, mental/emtional are areas that are out of balance in his life.  Pt was very active and appropriate during group discussion sharing that he works all the time and doesn't really have anything in life that he enjoys doing right now.            Therapeutic Modalities:   Cognitive Behavioral Therapy Solution-Focused Therapy Assertiveness Training  Daleen SquibbGreg Azelea Seguin, [place]CS[place]

## 2017-10-05 NOTE — Progress Notes (Signed)
Pt presents with an anxious affect. Pt expressed to writer having ongoing passive SI with no plan or intent. Pt expressed that he's been taking his meds but continues to have SI and can no longer continue to live this way. Pt verbalized that he recently started taking Prozac and Lithium for depression. Pt verbalized that he's been taking Abilify and is due for his injections today. Writer informed MD in treatment team of pt's concerns. Pt reports ongoing depression and anxiety.  Orders reviewed with pt. Verbal support provided. Pt encouraged to attend groups. 15 minute checks performed for safety. Pt encouraged to complete suicide safety plan. Environmental checks completed every shift for safety. Suicide risk assessment completed.  Pt compliant with tx plan.

## 2017-10-05 NOTE — Tx Team (Signed)
Initial Treatment Plan 10/05/2017 1:23 AM Andrew Noble [id number]    PATIENT STRESSORS: Health problems Medication change or noncompliance   PATIENT STRENGTHS: Capable of independent living General fund of knowledge Supportive family/friends   PATIENT IDENTIFIED PROBLEMS: "get stabilized on medications"  "quit feeling like I want to kill myself"                   DISCHARGE CRITERIA:  Improved stabilization in mood, thinking, and/or behavior Reduction of life-threatening or endangering symptoms to within safe limits Verbal commitment to aftercare and medication compliance  PRELIMINARY DISCHARGE PLAN: Attend aftercare/continuing care group Outpatient therapy Return to previous living arrangement Return to previous work or school arrangements  PATIENT/FAMILY INVOLVEMENT: This treatment plan has been presented to and reviewed with the patient, Andrew LuisRobert Lamping, and/or family member, .  The patient and family have been given the opportunity to ask questions and make suggestions.  Andrena Mewsuttall, Angelyse Heslin J, RN 10/05/2017, 1:23 AM

## 2017-10-05 NOTE — Plan of Care (Signed)
  Problem: Coping: Goal: Ability to demonstrate self-control will improve Outcome: Progressing   Problem: Safety: Goal: Periods of time without injury will increase Outcome: Progressing   

## 2017-10-05 NOTE — Progress Notes (Signed)
Pt is a 22 year old male admitted with depression and suicidal ideation with intent and a plan to do it quickly with a gun   Pt came to ER after having strong thoughts of hurting himself at work    He reports he was afraid he would actually do something   Pt gets mental health care outside the hospital and has had some recent medication changes    Pt lives alone and has a job    Denies drugs ETOH and tobacco    Pt is cooperative and did not need medication to help him sleep   He uses a CPAP machine for sleep apnea      Pt was oriented to the unit and offered nourishment    Assessment completed and Q 15 min checks started   Pt in his room and is adjusting well with no complaints
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
## 2017-10-05 NOTE — Progress Notes (Signed)
Nutrition Brief Note  Patient identified on the Malnutrition Screening Tool (MST) Report  Suspect weight from 6/20 is an error. No needs identified.  Wt Readings from Last 15 Encounters:  10/05/17 132 lb (59.9 kg)  09/21/17 292 lb (132.5 kg)  08/07/17 291 lb (132 kg)  08/04/17 293 lb 3.2 oz (133 kg)  06/09/17 295 lb (133.8 kg)  05/30/17 290 lb (131.5 kg)  05/08/17 295 lb 3.2 oz (133.9 kg)  05/03/17 294 lb (133.4 kg)  03/08/17 293 lb (132.9 kg)  02/24/17 294 lb (133.4 kg)  11/03/16 284 lb (128.8 kg)  09/29/16 280 lb (127 kg)  06/16/16 272 lb (123.4 kg)  05/19/16 274 lb 12.8 oz (124.6 kg)  04/21/16 269 lb 6.4 oz (122.2 kg)    Body mass index is 20.67 kg/m. suspect error with weight from 6/20.  Labs and medications reviewed.   No nutrition interventions warranted at this time. If nutrition issues arise, please consult RD.   Tilda FrancoLindsey Burtis Imhoff, MS, RD, LDN [facility] Clinical Dietitian Pager: [phone] After Hours Pager: [phone]

## 2017-10-05 NOTE — [facility] Suicide Risk Assessment (Signed)
[facility] INPATIENT:  Family/Significant Other Suicide Prevention Education  Suicide Prevention Education:  Education Completed; Andrew Noble, mother [phone]) has been identified by the patient as the family member/significant other with whom the patient will be residing, and identified as the person(s) who will aid the patient in the event of a mental health crisis (suicidal ideations/suicide attempt).  With written consent from the patient, the family member/significant other has been provided the following suicide prevention education, prior to the and/or following the discharge of the patient.  The suicide prevention education provided includes the following:  Suicide risk factors  Suicide prevention and interventions  National Suicide Hotline telephone number  [facility] assessment telephone number  [facility] Assistance 911  [facility] and/or Residential Mobile Crisis Unit telephone number  Request made of family/significant other to:  Remove weapons (e.g., guns, rifles, knives), all items previously/currently identified as safety concern.    Remove drugs/medications (over-the-counter, prescriptions, illicit drugs), all items previously/currently identified as a safety concern.  The family member/significant other verbalizes understanding of the suicide prevention education information provided.  The family member/significant other agrees to remove the items of safety concern listed above.    Andrew Noble 10/05/2017, 1:38 PM

## 2017-10-05 NOTE — H&P (Signed)
Psychiatric Admission Assessment Adult  Patient Identification: Andrew Noble MRN:  [id number] Date of Evaluation:  10/05/2017 Chief Complaint:  bipolar 1 disorder  Principal Diagnosis: <principal problem not specified> Diagnosis:   Patient Active Problem List   Diagnosis Date Noted  . Bipolar 1 disorder, depressed, severe ([place]) [F31.4] 10/05/2017  . Elevated liver enzymes [R74.8] 09/24/2015  . Obesity [E66.9] 09/24/2015  . Generalized anxiety disorder [F41.1] 03/05/2015  . Bipolar disorder with depression ([place]) [F31.9]   . Bipolar 1 disorder ([place]) [F31.9] 10/11/2013  . Mood disorder ([place]) [F39] 05/20/2011  . History of eating disorder [Z86.59] 05/20/2011   History of Present Illness: Patient is seen and examined.  Patient is a 22 year old male with a past psychiatric history significant for probable borderline personality disorder versus bipolar disorder, posttraumatic stress disorder and chronic suicidal thoughts.  The patient stated that he had been feeling more significantly depressed over the last 2 weeks.  He stated his thoughts about harming himself had increased as well.  The patient stated he had had suicidal thoughts basically all the time, but had worsened recently.  He had seen his outpatient psychiatrist a couple of weeks ago he started lithium 300 mg p.o. daily.  The patient was unsure whether or not this had been beneficial.  When the suicidal thoughts had worsened he presented to a [facility] and was evaluated in the emergency room by psychiatry on 6/16.  Fluoxetine was added to 10 mg p.o. daily.  Unfortunately a lithium level was not in that record.  He stated that over the next 24 hours things continue to worsen.  He began to think about driving off a cliff, or shooting himself.  He then presented to the [facility] emergency department for evaluation.  He admitted to suicidal ideation, worsening mood, feeling helpless, hopeless and worthless.  The  decision was made to admit him to the hospital for evaluation and stabilization.  He stated that he felt as though he had had suicidal thoughts since he was approximately 22 years of age.  He stated that a couple years ago he went without medications for over a year, but "the year was "disastrous". Associated Signs/Symptoms: Depression Symptoms:  depressed mood, anhedonia, insomnia, psychomotor agitation, fatigue, feelings of worthlessness/guilt, difficulty concentrating, hopelessness, recurrent thoughts of death, suicidal thoughts with specific plan, anxiety, loss of energy/fatigue, disturbed sleep, (Hypo) Manic Symptoms:  Impulsivity, Anxiety Symptoms:  Excessive Worry, Psychotic Symptoms:  Patient denied any psychotic symptoms. PTSD Symptoms: Negative Total Time spent with patient: 1 hour  Past Psychiatric History: Patient has been previously diagnosed with bipolar disorder, depression and anxiety disorder.  Is the patient at risk to self? Yes.    Has the patient been a risk to self in the past 6 months? Yes.    Has the patient been a risk to self within the distant past? Yes.    Is the patient a risk to others? No.  Has the patient been a risk to others in the past 6 months? No.  Has the patient been a risk to others within the distant past? No.   Prior Inpatient Therapy:   Prior Outpatient Therapy:    Alcohol Screening: 1. How often do you have a drink containing alcohol?: Never 2. How many drinks containing alcohol do you have on a typical day when you are drinking?: 1 or 2 3. How often do you have six or more drinks on one occasion?: Never AUDIT-C Score: 0 4. How often during the  last year have you found that you were not able to stop drinking once you had started?: Never 5. How often during the last year have you failed to do what was normally expected from you becasue of drinking?: Never 6. How often during the last year have you needed a first drink in the morning to  get yourself going after a heavy drinking session?: Never 7. How often during the last year have you had a feeling of guilt of remorse after drinking?: Never 8. How often during the last year have you been unable to remember what happened the night before because you had been drinking?: Never 9. Have you or someone else been injured as a result of your drinking?: No 10. Has a relative or friend or a doctor or another health worker been concerned about your drinking or suggested you cut down?: No Alcohol Use Disorder Identification Test Final Score (AUDIT): 0 Intervention/Follow-up: AUDIT Score <7 follow-up not indicated Substance Abuse History in the last 12 months:  No. Consequences of Substance Abuse: Negative Previous Psychotropic Medications: Yes  Psychological Evaluations: Yes  Past Medical History:  Past Medical History:  Diagnosis Date  . Abnormal laboratory test 02/12/2014  . Anxiety   . Bipolar disorder ([place])   . Depression   . Hx of substance abuse 02/12/2014  . Psychosis [facility])     Past Surgical History:  Procedure Laterality Date  . TONSILLECTOMY AND ADENOIDECTOMY     Family History:  Family History  Problem Relation Age of Onset  . Depression Mother   . Anxiety disorder Mother   . Bipolar disorder Maternal Aunt    Family Psychiatric  History: Mother had depression and anxiety, maternal aunt had bipolar disorder. Tobacco Screening: Have you used any form of tobacco in the last 30 days? (Cigarettes, Smokeless Tobacco, Cigars, and/or Pipes): No Tobacco use, Select all that apply: 4 or less cigarettes per day Are you interested in Tobacco Cessation Medications?: No, patient refused Counseled patient on smoking cessation including recognizing danger situations, developing coping skills and basic information about quitting provided: Refused/Declined practical counseling Social History:  Social History   Substance and Sexual Activity  Alcohol Use Yes  . Alcohol/week:  0.6 - 1.2 oz  . Types: 1 - 2 Cans of beer per week   Comment: rare     Social History   Substance and Sexual Activity  Drug Use Yes  . Types: Marijuana   Comment: no longer using    Additional Social History: Marital status: Single Are you sexually active?: No What is your sexual orientation?: "I'm asexual" Has your sexual activity been affected by drugs, alcohol, medication, or emotional stress?: No  Does patient have children?: No    Pain Medications: denies Prescriptions: denies Over the Counter: denies History of alcohol / drug use?: No history of alcohol / drug abuse Longest period of sobriety (when/how long): denies                    Allergies:  No Known Allergies Lab Results:  Results for orders placed or performed during the hospital encounter of 10/04/17 (from the past 48 hour(s))  Rapid urine drug screen (hospital performed)     Status: Abnormal   Collection Time: 10/04/17  7:43 PM  Result Value Ref Range   Opiates NONE DETECTED NONE DETECTED   Cocaine NONE DETECTED NONE DETECTED   Benzodiazepines NONE DETECTED NONE DETECTED   Amphetamines NONE DETECTED NONE DETECTED   Tetrahydrocannabinol NONE DETECTED NONE  DETECTED   Barbiturates (A) NONE DETECTED    Result not available. Reagent lot number recalled by manufacturer.    Comment: Performed at [facility], [place] [address], [place], [place] [address]  Comprehensive metabolic panel     Status: Abnormal   Collection Time: 10/04/17  7:51 PM  Result Value Ref Range   Sodium 141 135 - 145 mmol/L   Potassium 4.3 3.5 - 5.1 mmol/L   Chloride 106 101 - 111 mmol/L   CO2 27 22 - 32 mmol/L   Glucose, Bld 103 (H) 65 - 99 mg/dL   BUN 11 6 - 20 mg/dL   Creatinine, Ser 1.09 0.61 - 1.24 mg/dL   Calcium 10.0 8.9 - 10.3 mg/dL   Total Protein 7.6 6.5 - 8.1 g/dL   Albumin 4.7 3.5 - 5.0 g/dL   AST 48 (H) 15 - 41 U/L   ALT 83 (H) 17 - 63 U/L   Alkaline Phosphatase 80 38 - 126 U/L   Total Bilirubin  0.8 0.3 - 1.2 mg/dL   GFR calc non Af Amer >60 >60 mL/min   GFR calc Af Amer >60 >60 mL/min    Comment: (NOTE) The eGFR has been calculated using the CKD EPI equation. This calculation has not been validated in all clinical situations. eGFR's persistently <60 mL/min signify possible Chronic Kidney Disease.    Anion gap 8 5 - 15    Comment: Performed at [facility], [place] [address] [place], [place] [address]  Ethanol     Status: None   Collection Time: 10/04/17  7:51 PM  Result Value Ref Range   Alcohol, Ethyl (B) <10 <10 mg/dL    Comment: (NOTE) Lowest detectable limit for serum alcohol is 10 mg/dL. For medical purposes only. Performed at [facility], [place] [address], [place], [place] [address]   Salicylate level     Status: None   Collection Time: 10/04/17  7:51 PM  Result Value Ref Range   Salicylate Lvl <2.8 2.8 - 30.0 mg/dL    Comment: Performed at [facility], [place] [address], [place], [place] [address]  Acetaminophen level     Status: Abnormal   Collection Time: 10/04/17  7:51 PM  Result Value Ref Range   Acetaminophen (Tylenol), Serum <10 (L) 10 - 30 ug/mL    Comment: (NOTE) Therapeutic concentrations vary significantly. A range of 10-30 ug/mL  may be an effective concentration for many patients. However, some  are best treated at concentrations outside of this range. Acetaminophen concentrations >150 ug/mL at 4 hours after ingestion  and >50 ug/mL at 12 hours after ingestion are often associated with  toxic reactions. Performed at [facility], [place] [address], [place], [place] [address]   cbc     Status: Abnormal   Collection Time: 10/04/17  7:51 PM  Result Value Ref Range   WBC 12.0 (H) 4.0 - 10.5 K/uL   RBC 5.72 4.22 - 5.81 MIL/uL   Hemoglobin 18.8 (H) 13.0 - 17.0 g/dL   HCT 52.3 (H) 39.0 - 52.0 %   MCV 91.4 78.0 - 100.0 fL   MCH 32.9 26.0 - 34.0 pg   MCHC 35.9 30.0 - 36.0 g/dL    RDW 12.5 11.5 - 15.5 %   Platelets 325 150 - 400 K/uL    Comment: Performed at [facility], [place] [address] [place],  [address]    Blood Alcohol level:  Lab Results  Component  Value Date   ETH <10 70/26/3785    Metabolic Disorder Labs:  No results found for: HGBA1C, MPG Lab Results  Component Value Date   PROLACTIN 5.0 04/29/2014   PROLACTIN 46.1 (H) 12/17/2013   No results found for: CHOL, TRIG, HDL, CHOLHDL, VLDL, LDLCALC  Current Medications: Current Facility-Administered Medications  Medication Dose Route Frequency Provider Last Rate Last Dose  . acetaminophen (TYLENOL) tablet 650 mg  650 mg Oral Q6H PRN Lindon Romp A, NP      . alum & mag hydroxide-simeth (MAALOX/MYLANTA) 200-200-20 MG/5ML suspension 30 mL  30 mL Oral Q4H PRN Lindon Romp A, NP      . feeding supplement (ENSURE ENLIVE) (ENSURE ENLIVE) liquid 237 mL  237 mL Oral BID BM Lindon Romp A, NP      . FLUoxetine (PROZAC) capsule 10 mg  10 mg Oral Daily Lindon Romp A, NP   10 mg at 10/05/17 8850  . gabapentin (NEURONTIN) capsule 100 mg  100 mg Oral TID Sharma Covert, MD      . lithium carbonate capsule 600 mg  600 mg Oral QHS Sharma Covert, MD      . magnesium hydroxide (MILK OF MAGNESIA) suspension 30 mL  30 mL Oral Daily PRN Lindon Romp A, NP      . modafinil (PROVIGIL) tablet 100 mg  100 mg Oral Daily Lindon Romp A, NP   100 mg at 10/05/17 0809   PTA Medications: Facility-Administered Medications Prior to Admission  Medication Dose Route Frequency Provider Last Rate Last Dose  . ARIPiprazole ER (ABILIFY MAINTENA) 400 MG prefilled syringe 400 mg  400 mg Intramuscular Q28 days Aundra Dubin, MD   400 mg at 09/04/17 0919   Medications Prior to Admission  Medication Sig Dispense Refill Last Dose  . ARIPiprazole ER (ABILIFY MAINTENA) 400 MG PRSY prefilled syringe Inject 400 mg into the muscle every 28 (twenty-eight) days. 1 each 1 Past Month at Unknown time  .  FLUoxetine (PROZAC) 10 MG capsule Take 10 mg by mouth daily.   10/04/2017 at Unknown time  . lithium 300 MG tablet Take 1 tablet (300 mg total) by mouth daily. 30 tablet 1 10/04/2017 at Unknown time  . modafinil (PROVIGIL) 100 MG tablet Take 1 tablet (100 mg total) by mouth daily. 30 tablet 1 10/04/2017 at Unknown time    Musculoskeletal: Strength & Muscle Tone: within normal limits Gait & Station: normal Patient leans: N/A  Psychiatric Specialty Exam: Physical Exam  Nursing note and vitals reviewed. Constitutional: He is oriented to person, place, and time. He appears well-developed and well-nourished.  HENT:  Head: Normocephalic and atraumatic.  Respiratory: Effort normal.  Neurological: He is alert and oriented to person, place, and time.    ROS  Blood pressure (!) 146/64, pulse 93, temperature 98.2 F (36.8 C), temperature source Oral, resp. rate 16, height 5' 7"  (1.702 m), weight 59.9 kg (132 lb).Body mass index is 20.67 kg/m.  General Appearance: Casual  Eye Contact:  Fair  Speech:  Normal Rate  Volume:  Normal  Mood:  Anxious, Depressed and Dysphoric  Affect:  Constricted  Thought Process:  Coherent  Orientation:  Full (Time, Place, and Person)  Thought Content:  Logical  Suicidal Thoughts:  Yes.  without intent/plan  Homicidal Thoughts:  No  Memory:  Immediate;   Fair Recent;   Fair Remote;   Fair  Judgement:  Intact  Insight:  Lacking  Psychomotor Activity:  Increased  Concentration:  Concentration: Fair and  Attention Span: Fair  Recall:  [employer] of Knowledge:  Fair  Language:  Fair  Akathisia:  Negative  Handed:  Right  AIMS (if indicated):     Assets:  Desire for Improvement Housing Physical Health Resilience Talents/Skills  ADL's:  Intact  Cognition:  WNL  Sleep:  Number of Hours: 2.5    Treatment Plan Summary: Daily contact with patient to assess and evaluate symptoms and progress in treatment, Medication management and Plan Patient is seen and  examined.  Patient is a 22 year old male with the above-stated past psychiatric history was admitted for worsening suicidal ideation.  He will be admitted to the hospital.  He will be integrated into the milieu.  He will be monitored every 15 minutes for safety checks.  He will meet with social work individually as well as in groups.  He will be encouraged to attend groups.  We will work on Radiographer, therapeutic.  He was previously placed on lithium approximately 2 weeks ago.  This was at 300 mg p.o. daily.  I am going to increase that dosage to 600 mg, but change it to p.o. nightly.  He is also on long-acting Abilify injection.  It scheduled for today, but he does not believe that it has been of much benefit.  I will hold off on that for now.  Unfortunately a lithium level was not obtained from the patient on admission here nor at the emergency room at [facility] 2 days ago.  I will go on and order one for the a.m.  We will continue the fluoxetine at 10 mg p.o. daily.  I am also going to add Neurontin 100 mg p.o. 3 times daily for anxiety at this point.  He is on modafinil for possible narcolepsy as well as fatigue and sedation.  This will be continued.  Cognitive behavioral therapy/dialectic behavioral therapy was discussed with the patient with his outpatient psychiatrist on his last visit, and prior to discharge I will attempt to get him involved with dialectic behavioral therapy.  Observation Level/Precautions:  15 minute checks  Laboratory:  Chemistry Profile  Psychotherapy:    Medications:    Consultations:    Discharge Concerns:    Estimated LOS:  Other:     Physician Treatment Plan for Primary Diagnosis: <principal problem not specified> Long Term Goal(s): Improvement in symptoms so as ready for discharge  Short Term Goals: Ability to identify changes in lifestyle to reduce recurrence of condition will improve, Ability to verbalize feelings will improve, Ability to disclose and discuss suicidal ideas,  Ability to demonstrate self-control will improve, Ability to identify and develop effective coping behaviors will improve and Ability to maintain clinical measurements within normal limits will improve  Physician Treatment Plan for Secondary Diagnosis: Active Problems:   Bipolar 1 disorder, depressed, severe ([place])  Long Term Goal(s): Improvement in symptoms so as ready for discharge  Short Term Goals: Ability to identify changes in lifestyle to reduce recurrence of condition will improve, Ability to verbalize feelings will improve, Ability to disclose and discuss suicidal ideas, Ability to demonstrate self-control will improve, Ability to identify and develop effective coping behaviors will improve and Ability to maintain clinical measurements within normal limits will improve  I certify that inpatient services furnished can reasonably be expected to improve the patient's condition.    Sharma Covert, MD 6/20/201911:50 AM

## 2017-10-05 NOTE — [facility] Group Notes (Deleted)
[facility] LCSW Group Therapy Note  Date/Time: 10/05/17, 1315  Type of Therapy and Topic:  Group Therapy:  Overcoming Obstacles  Participation Level:  active  Description of Group:    In this group patients will be encouraged to explore what they see as obstacles to their own wellness and recovery. They will be guided to discuss their thoughts, feelings, and behaviors related to these obstacles. The group will process together ways to cope with barriers, with attention given to specific choices patients can make. Each patient will be challenged to identify changes they are motivated to make in order to overcome their obstacles. This group will be process-oriented, with patients participating in exploration of their own experiences as well as giving and receiving support and challenge from other group members.  Therapeutic Goals: 1. Patient will identify personal and current obstacles as they relate to admission. 2. Patient will identify barriers that currently interfere with their wellness or overcoming obstacles.  3. Patient will identify feelings, thought process and behaviors related to these barriers. 4. Patient will identify two changes they are willing to make to overcome these obstacles:    Summary of Patient Progress: Pt shared that physical, financial, mental/emtional are areas that are out of balance in his life.  Pt was very active and appropriate during group discussion sharing that he works all the time and doesn't really have anything in life that he enjoys doing right now.      Therapeutic Modalities:   Cognitive Behavioral Therapy Solution Focused Therapy Motivational Interviewing Relapse Prevention Therapy  Daleen SquibbGreg Laakea Pereira, LCSW

## 2017-10-06 LAB — TSH: TSH: 2.304 u[IU]/mL (ref 0.350–4.500)

## 2017-10-06 LAB — LITHIUM LEVEL: Lithium Lvl: 0.18 mmol/L — ABNORMAL LOW (ref 0.60–1.20)

## 2017-10-06 MED ORDER — FLUOXETINE HCL 20 MG PO CAPS
20.0000 mg | ORAL_CAPSULE | Freq: Every day | ORAL | Status: DC
  Administered 2017-10-07 – 2017-10-09 (×3): 20 mg via ORAL
  Filled 2017-10-06 (×5): qty 1

## 2017-10-06 NOTE — [facility] Group Notes (Signed)
  [facility]  Date/Time: 10/06/17, 1315  Type of Therapy/Topic:  Group Therapy:  Emotion Regulation  Participation Level:  Active   Mood:pleasant  Description of Group:    The purpose of this group is to assist patients in learning to regulate negative emotions and experience positive emotions. Patients will be guided to discuss ways in which they have been vulnerable to their negative emotions. These vulnerabilities will be juxtaposed with experiences of positive emotions or situations, and patients challenged to use positive emotions to combat negative ones. Special emphasis will be placed on coping with negative emotions in conflict situations, and patients will process healthy conflict resolution skills.  Therapeutic Goals: 1. Patient will identify two positive emotions or experiences to reflect on in order to balance out negative emotions:  2. Patient will label two or more emotions that they find the most difficult to experience:  3. Patient will be able to demonstrate positive conflict resolution skills through discussion or role plays:   Summary of Patient Progress:Pt shared that depression is the emotion that is difficult for him to experience.  Pt participated in group discussion regarding making plans for how to handle difficult emotions when they occur.  Pt is active and engaged and continues to do well during the groups.         Therapeutic Modalities:   Cognitive Behavioral Therapy Feelings Identification Dialectical Behavioral Therapy  Daleen SquibbGreg Olivette Beckmann, LCSW

## 2017-10-06 NOTE — Progress Notes (Signed)
Pt was unable to go to the cafeteria for breakfast because he became nauseated during blood draw attempt.  After multiple unsuccessful attempts, he vomited x 1 and needed to sit in chair due to dizziness and blurred vision.  He was able to walk back to his room after drinking Gatorade and sprite.  Lab was rescheduled for tomorrow morning.

## 2017-10-06 NOTE — Progress Notes (Signed)
Recreation Therapy Notes  Date: 6.21.19 Time: 0930 Location: [address] Dayroom  Group Topic: Stress Management  Goal Area(s) Addresses:  Patient will verbalize importance of using healthy stress management.  Patient will identify positive emotions associated with healthy stress management.   Intervention: Stress Management  Activity :  LRT introduced the stress management technique of meditation.  LRT played a meditation and patients were to follow along as meditation was played to fully engage in the activity.  Education:  Stress Management, Discharge Planning.   Education Outcome: Acknowledges edcuation/In group clarification offered/Needs additional education  Clinical Observations/Feedback: Pt did not attend group.    Caroll RancherMarjette Gerre Ranum, LRT/CTRS         Caroll RancherLindsay, Angelle Isais A 10/06/2017 11:52 AM

## 2017-10-06 NOTE — Progress Notes (Signed)
[facility] MD Progress Note  10/06/2017 11:36 AM Andrew Noble  MRN:  [id number] Subjective: Patient is seen and examined.  Patient is a 22 year old male with a past psychiatric history significant for probable borderline personality disorder versus bipolar disorder, posttraumatic stress disorder and chronic suicidal ideation.  He is seen in follow-up.  He stated that he felt slightly better today.  He is sleeping a little bit more during the day.  He stated he does not sleep well at night.  He wakes up 3 or 4 times a night.  He has been on the modafinil for approximately 2 months.  He is unsure as to whether or not the modafinil has caused him problems sleeping at night.  He stated his thoughts of suicide have decreased slightly, and not as serious as they were.  His mood and affect remained essentially unchanged.  We discussed the continued cessation of his Abilify injection, and the potential for increasing his fluoxetine. Principal Problem: <principal problem not specified> Diagnosis:   Patient Active Problem List   Diagnosis Date Noted  . Bipolar 1 disorder, depressed, severe ([place]) [F31.4] 10/05/2017  . Borderline personality disorder ([place]) [F60.3]   . Elevated liver enzymes [R74.8] 09/24/2015  . Obesity [E66.9] 09/24/2015  . Generalized anxiety disorder [F41.1] 03/05/2015  . Bipolar disorder with depression ([place]) [F31.9]   . Bipolar 1 disorder ([place]) [F31.9] 10/11/2013  . Mood disorder ([place]) [F39] 05/20/2011  . History of eating disorder [Z86.59] 05/20/2011   Total Time spent with patient: 20 minutes  Past Psychiatric History: Admission H&P  Past Medical History:  Past Medical History:  Diagnosis Date  . Abnormal laboratory test 02/12/2014  . Anxiety   . Bipolar disorder ([place])   . Depression   . Hx of substance abuse 02/12/2014  . Psychosis [facility])     Past Surgical History:  Procedure Laterality Date  . TONSILLECTOMY AND ADENOIDECTOMY     Family History:  Family History  Problem  Relation Age of Onset  . Depression Mother   . Anxiety disorder Mother   . Bipolar disorder Maternal Aunt    Family Psychiatric  History: See admission H&P Social History:  Social History   Substance and Sexual Activity  Alcohol Use Yes  . Alcohol/week: 0.6 - 1.2 oz  . Types: 1 - 2 Cans of beer per week   Comment: rare     Social History   Substance and Sexual Activity  Drug Use Yes  . Types: Marijuana   Comment: no longer using    Social History   Socioeconomic History  . Marital status: Single    Spouse name: Not on file  . Number of children: Not on file  . Years of education: Not on file  . Highest education level: Not on file  Occupational History  . Not on file  Social Needs  . Financial resource strain: Not on file  . Food insecurity:    Worry: Not on file    Inability: Not on file  . Transportation needs:    Medical: Not on file    Non-medical: Not on file  Tobacco Use  . Smoking status: Light Tobacco Smoker    Packs/day: 0.25    Types: Cigarettes  . Smokeless tobacco: Never Used  . Tobacco comment: Has cut back to only 1-2 a day and trying to quit  Substance and Sexual Activity  . Alcohol use: Yes    Alcohol/week: 0.6 - 1.2 oz    Types: 1 - 2 Cans of  beer per week    Comment: rare  . Drug use: Yes    Types: Marijuana    Comment: no longer using  . Sexual activity: Never  Lifestyle  . Physical activity:    Days per week: Not on file    Minutes per session: Not on file  . Stress: Not on file  Relationships  . Social connections:    Talks on phone: Not on file    Gets together: Not on file    Attends religious service: Not on file    Active member of club or organization: Not on file    Attends meetings of clubs or organizations: Not on file    Relationship status: Not on file  Other Topics Concern  . Not on file  Social History Narrative  . Not on file   Additional Social History:    Pain Medications: denies Prescriptions:  denies Over the Counter: denies History of alcohol / drug use?: No history of alcohol / drug abuse Longest period of sobriety (when/how long): denies                    Sleep: Poor  Appetite:  Good  Current Medications: Current Facility-Administered Medications  Medication Dose Route Frequency Provider Last Rate Last Dose  . acetaminophen (TYLENOL) tablet 650 mg  650 mg Oral Q6H PRN Rozetta Nunnery, NP      . alum & mag hydroxide-simeth (MAALOX/MYLANTA) 200-200-20 MG/5ML suspension 30 mL  30 mL Oral Q4H PRN Lindon Romp A, NP      . feeding supplement (ENSURE ENLIVE) (ENSURE ENLIVE) liquid 237 mL  237 mL Oral BID BM Rozetta Nunnery, NP      . Derrill Memo ON 10/07/2017] FLUoxetine (PROZAC) capsule 20 mg  20 mg Oral Daily Sharma Covert, MD      . gabapentin (NEURONTIN) capsule 100 mg  100 mg Oral TID Sharma Covert, MD   100 mg at 10/06/17 0817  . lithium carbonate capsule 600 mg  600 mg Oral QHS Sharma Covert, MD   600 mg at 10/05/17 2116  . magnesium hydroxide (MILK OF MAGNESIA) suspension 30 mL  30 mL Oral Daily PRN Lindon Romp A, NP      . modafinil (PROVIGIL) tablet 100 mg  100 mg Oral Daily Lindon Romp A, NP   100 mg at 10/06/17 0817  . traZODone (DESYREL) tablet 50 mg  50 mg Oral QHS PRN Rozetta Nunnery, NP        Lab Results:  Results for orders placed or performed during the hospital encounter of 10/04/17 (from the past 48 hour(s))  Rapid urine drug screen (hospital performed)     Status: Abnormal   Collection Time: 10/04/17  7:43 PM  Result Value Ref Range   Opiates NONE DETECTED NONE DETECTED   Cocaine NONE DETECTED NONE DETECTED   Benzodiazepines NONE DETECTED NONE DETECTED   Amphetamines NONE DETECTED NONE DETECTED   Tetrahydrocannabinol NONE DETECTED NONE DETECTED   Barbiturates (A) NONE DETECTED    Result not available. Reagent lot number recalled by manufacturer.    Comment: Performed at [facility], [place] [address]  [place], [place] [address]  Comprehensive metabolic panel     Status: Abnormal   Collection Time: 10/04/17  7:51 PM  Result Value Ref Range   Sodium 141 135 - 145 mmol/L   Potassium 4.3 3.5 - 5.1 mmol/L   Chloride 106 101 - 111 mmol/L   CO2  27 22 - 32 mmol/L   Glucose, Bld 103 (H) 65 - 99 mg/dL   BUN 11 6 - 20 mg/dL   Creatinine, Ser 1.09 0.61 - 1.24 mg/dL   Calcium 10.0 8.9 - 10.3 mg/dL   Total Protein 7.6 6.5 - 8.1 g/dL   Albumin 4.7 3.5 - 5.0 g/dL   AST 48 (H) 15 - 41 U/L   ALT 83 (H) 17 - 63 U/L   Alkaline Phosphatase 80 38 - 126 U/L   Total Bilirubin 0.8 0.3 - 1.2 mg/dL   GFR calc non Af Amer >60 >60 mL/min   GFR calc Af Amer >60 >60 mL/min    Comment: (NOTE) The eGFR has been calculated using the CKD EPI equation. This calculation has not been validated in all clinical situations. eGFR's persistently <60 mL/min signify possible Chronic Kidney Disease.    Anion gap 8 5 - 15    Comment: Performed at [facility], [place] [address] [place], [place] [address]  Ethanol     Status: None   Collection Time: 10/04/17  7:51 PM  Result Value Ref Range   Alcohol, Ethyl (B) <10 <10 mg/dL    Comment: (NOTE) Lowest detectable limit for serum alcohol is 10 mg/dL. For medical purposes only. Performed at [facility], [place] [address], [place], [place] [address]   Salicylate level     Status: None   Collection Time: 10/04/17  7:51 PM  Result Value Ref Range   Salicylate Lvl <6.2 2.8 - 30.0 mg/dL    Comment: Performed at [facility], [place] [address] [place], [place] [address]  Acetaminophen level     Status: Abnormal   Collection Time: 10/04/17  7:51 PM  Result Value Ref Range   Acetaminophen (Tylenol), Serum <10 (L) 10 - 30 ug/mL    Comment: (NOTE) Therapeutic concentrations vary significantly. A range of 10-30 ug/mL  may be an effective concentration for many patients. However, some  are best treated at concentrations outside of  this range. Acetaminophen concentrations >150 ug/mL at 4 hours after ingestion  and >50 ug/mL at 12 hours after ingestion are often associated with  toxic reactions. Performed at [facility], [place] [address], [place], [place] [address]   cbc     Status: Abnormal   Collection Time: 10/04/17  7:51 PM  Result Value Ref Range   WBC 12.0 (H) 4.0 - 10.5 K/uL   RBC 5.72 4.22 - 5.81 MIL/uL   Hemoglobin 18.8 (H) 13.0 - 17.0 g/dL   HCT 52.3 (H) 39.0 - 52.0 %   MCV 91.4 78.0 - 100.0 fL   MCH 32.9 26.0 - 34.0 pg   MCHC 35.9 30.0 - 36.0 g/dL   RDW 12.5 11.5 - 15.5 %   Platelets 325 150 - 400 K/uL    Comment: Performed at [facility], [place] [address] [place], [place] [address]    Blood Alcohol level:  Lab Results  Component Value Date   ETH <10 62/69/4854    Metabolic Disorder Labs: No results found for: HGBA1C, MPG Lab Results  Component Value Date   PROLACTIN 5.0 04/29/2014   PROLACTIN 46.1 (H) 12/17/2013   No results found for: CHOL, TRIG, HDL, CHOLHDL, VLDL, LDLCALC  Physical Findings: AIMS: Facial and Oral Movements Muscles of Facial Expression: None, normal Lips and Perioral Area: None, normal Jaw: None, normal Tongue: None, normal,Extremity Movements Upper (arms, wrists, hands, fingers): None, normal Lower (legs, knees, ankles, toes):  None, normal, Trunk Movements Neck, shoulders, hips: None, normal, Overall Severity Severity of abnormal movements (highest score from questions above): None, normal Incapacitation due to abnormal movements: None, normal Patient's awareness of abnormal movements (rate only patient's report): No Awareness, Dental Status Current problems with teeth and/or dentures?: No Does patient usually wear dentures?: No  CIWA:    COWS:     Musculoskeletal: Strength & Muscle Tone: within normal limits Gait & Station: normal Patient leans: N/A  Psychiatric Specialty Exam: Physical Exam  Nursing note and vitals  reviewed. Constitutional: He is oriented to person, place, and time. He appears well-developed and well-nourished.  HENT:  Head: Normocephalic and atraumatic.  Respiratory: Effort normal.  Neurological: He is alert and oriented to person, place, and time.    ROS  Blood pressure (!) 138/97, pulse 97, temperature 97.7 F (36.5 C), temperature source Oral, resp. rate 18, height _0  (1.702 m), weight 59.9 kg (132 lb).Body mass index is 20.67 kg/m.  General Appearance: Casual  Eye Contact:  Fair  Speech:  Slow  Volume:  Decreased  Mood:  Depressed  Affect:  Congruent  Thought Process:  Coherent  Orientation:  Full (Time, Place, and Person)  Thought Content:  Logical  Suicidal Thoughts:  Yes.  without intent/plan  Homicidal Thoughts:  No  Memory:  Immediate;   Fair Recent;   Fair Remote;   Fair  Judgement:  Intact  Insight:  Fair  Psychomotor Activity:  Psychomotor Retardation  Concentration:  Concentration: Fair and Attention Span: Fair  Recall:  [employer] of Knowledge:  Good  Language:  Good  Akathisia:  Negative  Handed:  Right  AIMS (if indicated):     Assets:  Desire for Improvement Financial Resources/Insurance Housing Physical Health Resilience  ADL's:  Intact  Cognition:  WNL  Sleep:  Number of Hours: 6.75     Treatment Plan Summary: Daily contact with patient to assess and evaluate symptoms and progress in treatment, Medication management and Plan Patient is seen and examined.  Patient is a 22 year old male with the above-stated past psychiatric history seen in follow-up.  He is only slightly better than yesterday.  I am going to increase his fluoxetine to 20 mg p.o. daily.  I am also going to increase his trazodone 200 mg p.o. nightly.  We attempted to get a lithium level on him this morning, but the lab person was unable to draw the blood.  He will be sent to the hospital today for lab work including a TSH, lithium level and chemistries.  I will increase his  trazodone 200 mg p.o. nightly as needed, but if he continues to be sedated during the day he may either require getting rid of the modafinil so it does not interrupt his sleep at night, or perhaps increasing the modafinil to 200 mg p.o. daily and keeping him awake during the day.  I do not want to see it agitate him, provoke mania or interfere with his sleep anymore than it is.  His blood pressure and heart rate are slightly elevated today, and again that may be secondary to the modafinil.  We will have to watch that.  Sharma Covert, MD 10/06/2017, 11:36 AM

## 2017-10-06 NOTE — Plan of Care (Signed)
  Problem: Education: Goal: Emotional status will improve Outcome: Progressing   

## 2017-10-06 NOTE — Progress Notes (Signed)
Pt was observed in the dayroom attending wrap-up group. Pt participated. Pt was anxious/depressed in affect and mood. Pt denies SI/HI/AVH/Pain at this time. Pt states "Can I get adult pads? When I get anxious, I tend to..". Initially Pt was quiet but later open up with Clinical research associatewriter. Scedule meds given as ordered. No concerns/questions as this time. Will continue with POC.

## 2017-10-06 NOTE — Progress Notes (Addendum)
D Pt is observed OOB UAL on the 400 hall today. [facility] and had stat lithium level drawn ( = .18).    A He completes hi daily assessment and this he writes he denies SI today and he rates his depression, hopelessness and anxiety " 5/4/5", respectively. His affect is flat, depressed. He's just there...he avoids eye contact. He does not engage in conversation with other patients but he is pleasant and cooperative when he is engaged by others.    R Safety is in place.

## 2017-10-06 NOTE — Tx Team (Signed)
Interdisciplinary Treatment and Diagnostic Plan Update  10/06/2017 Time of Session: 0907 Andrew Noble MRN: [id number]  Principal Diagnosis: <principal problem not specified>  Secondary Diagnoses: Active Problems:   Bipolar 1 disorder, depressed, severe (HCC)   Borderline personality disorder (HCC)   Current Medications:  Current Facility-Administered Medications  Medication Dose Route Frequency Provider Last Rate Last Dose  . acetaminophen (TYLENOL) tablet 650 mg  650 mg Oral Q6H PRN Nira Conn A, NP      . alum & mag hydroxide-simeth (MAALOX/MYLANTA) 200-200-20 MG/5ML suspension 30 mL  30 mL Oral Q4H PRN Nira Conn A, NP      . feeding supplement (ENSURE ENLIVE) (ENSURE ENLIVE) liquid 237 mL  237 mL Oral BID BM Nira Conn A, NP      . FLUoxetine (PROZAC) capsule 10 mg  10 mg Oral Daily Nira Conn A, NP   10 mg at 10/06/17 0817  . gabapentin (NEURONTIN) capsule 100 mg  100 mg Oral TID Antonieta Pert, MD   100 mg at 10/06/17 0817  . lithium carbonate capsule 600 mg  600 mg Oral QHS Antonieta Pert, MD   600 mg at 10/05/17 2116  . magnesium hydroxide (MILK OF MAGNESIA) suspension 30 mL  30 mL Oral Daily PRN Nira Conn A, NP      . modafinil (PROVIGIL) tablet 100 mg  100 mg Oral Daily Nira Conn A, NP   100 mg at 10/06/17 0817  . traZODone (DESYREL) tablet 50 mg  50 mg Oral QHS PRN Jackelyn Poling, NP       PTA Medications: Facility-Administered Medications Prior to Admission  Medication Dose Route Frequency Provider Last Rate Last Dose  . ARIPiprazole ER (ABILIFY MAINTENA) 400 MG prefilled syringe 400 mg  400 mg Intramuscular Q28 days Burnard Leigh, MD   400 mg at 09/04/17 0919   Medications Prior to Admission  Medication Sig Dispense Refill Last Dose  . ARIPiprazole ER (ABILIFY MAINTENA) 400 MG PRSY prefilled syringe Inject 400 mg into the muscle every 28 (twenty-eight) days. 1 each 1 Past Month at Unknown time  . FLUoxetine (PROZAC) 10 MG capsule Take 10 mg  by mouth daily.   10/04/2017 at Unknown time  . lithium 300 MG tablet Take 1 tablet (300 mg total) by mouth daily. 30 tablet 1 10/04/2017 at Unknown time  . modafinil (PROVIGIL) 100 MG tablet Take 1 tablet (100 mg total) by mouth daily. 30 tablet 1 10/04/2017 at Unknown time    Patient Stressors: Health problems Medication change or noncompliance  Patient Strengths: Capable of independent living General fund of knowledge Supportive family/friends  Treatment Modalities: Medication Management, Group therapy, Case management,  1 to 1 session with clinician, Psychoeducation, Recreational therapy.   Physician Treatment Plan for Primary Diagnosis: <principal problem not specified> Long Term Goal(s): Improvement in symptoms so as ready for discharge Improvement in symptoms so as ready for discharge   Short Term Goals: Ability to identify changes in lifestyle to reduce recurrence of condition will improve Ability to verbalize feelings will improve Ability to disclose and discuss suicidal ideas Ability to demonstrate self-control will improve Ability to identify and develop effective coping behaviors will improve Ability to maintain clinical measurements within normal limits will improve Ability to identify changes in lifestyle to reduce recurrence of condition will improve Ability to verbalize feelings will improve Ability to disclose and discuss suicidal ideas Ability to demonstrate self-control will improve Ability to identify and develop effective coping behaviors will improve Ability to maintain  clinical measurements within normal limits will improve  Medication Management: Evaluate patient's response, side effects, and tolerance of medication regimen.  Therapeutic Interventions: 1 to 1 sessions, Unit Group sessions and Medication administration.  Evaluation of Outcomes: Progressing  Physician Treatment Plan for Secondary Diagnosis: Active Problems:   Bipolar 1 disorder, depressed,  severe (HCC)   Borderline personality disorder (HCC)  Long Term Goal(s): Improvement in symptoms so as ready for discharge Improvement in symptoms so as ready for discharge   Short Term Goals: Ability to identify changes in lifestyle to reduce recurrence of condition will improve Ability to verbalize feelings will improve Ability to disclose and discuss suicidal ideas Ability to demonstrate self-control will improve Ability to identify and develop effective coping behaviors will improve Ability to maintain clinical measurements within normal limits will improve Ability to identify changes in lifestyle to reduce recurrence of condition will improve Ability to verbalize feelings will improve Ability to disclose and discuss suicidal ideas Ability to demonstrate self-control will improve Ability to identify and develop effective coping behaviors will improve Ability to maintain clinical measurements within normal limits will improve     Medication Management: Evaluate patient's response, side effects, and tolerance of medication regimen.  Therapeutic Interventions: 1 to 1 sessions, Unit Group sessions and Medication administration.  Evaluation of Outcomes: Progressing   RN Treatment Plan for Primary Diagnosis: <principal problem not specified> Long Term Goal(s): Knowledge of disease and therapeutic regimen to maintain health will improve  Short Term Goals: Ability to identify and develop effective coping behaviors will improve and Compliance with prescribed medications will improve  Medication Management: RN will administer medications as ordered by provider, will assess and evaluate patient's response and provide education to patient for prescribed medication. RN will report any adverse and/or side effects to prescribing provider.  Therapeutic Interventions: 1 on 1 counseling sessions, Psychoeducation, Medication administration, Evaluate responses to treatment, Monitor vital signs and  CBGs as ordered, Perform/monitor CIWA, COWS, AIMS and Fall Risk screenings as ordered, Perform wound care treatments as ordered.  Evaluation of Outcomes: Progressing   LCSW Treatment Plan for Primary Diagnosis: <principal problem not specified> Long Term Goal(s): Safe transition to appropriate next level of care at discharge, Engage patient in therapeutic group addressing interpersonal concerns.  Short Term Goals: Engage patient in aftercare planning with referrals and resources, Increase social support and Increase skills for wellness and recovery  Therapeutic Interventions: Assess for all discharge needs, 1 to 1 time with Social worker, Explore available resources and support systems, Assess for adequacy in community support network, Educate family and significant other(s) on suicide prevention, Complete Psychosocial Assessment, Interpersonal group therapy.  Evaluation of Outcomes: Progressing   Progress in Treatment: Attending groups: Yes. Participating in groups: Yes. Taking medication as prescribed: Yes. Toleration medication: Yes. Family/Significant other contact made: Yes, individual(s) contacted:  mother Patient understands diagnosis: Yes. Discussing patient identified problems/goals with staff: Yes. Medical problems stabilized or resolved: Yes. Denies suicidal/homicidal ideation: Yes. Issues/concerns per patient self-inventory: No. Other: none  New problem(s) identified: No, Describe:  none  New Short Term/Long Term Goal(s):  Patient Goals:  "Get on the right medication, start feeling better about myself."  Discharge Plan or Barriers:   Reason for Continuation of Hospitalization: Depression Medication stabilization  Estimated Length of Stay: 2-4 days.  Attendees: Patient:Andrew Noble 10/06/2017   Physician: Dr Jola Babinskilary, MD 10/06/2017   Nursing: Dewayne ShorterAlyssa Johnson, RN 10/06/2017   RN Care Manager: 10/06/2017   Social Worker: Daleen SquibbGreg Valmore Arabie, LCSW 10/06/2017   Recreational  Therapist:  10/06/2017   Other:  10/06/2017   Other:  10/06/2017   Other: 10/06/2017        Scribe for Treatment Team: Lorri Frederick, LCSW 10/06/2017 9:33 AM

## 2017-10-06 NOTE — Progress Notes (Signed)
D:  [name] was up and visible on the unit.  He attended evening wrap up group.  He was pleasant and cooperative.  He continues to voice passive SI and is able to contract for safety on the unit.  He denied HI or A/V hallucinations.  He reported he was concerned about not being able to sleep tonight.  PRN obtained for sleep but he went to bed before he requested the medication.   A:  1:1 with RN for support and encouragement.  Medications as ordered.  Q 15 minute checks maintained for safety.  Encouraged participation in group and unit activities.   R:  Rober remains safe on the unit.  We will continue to monitor the progress towards his goals.

## 2017-10-06 NOTE — Progress Notes (Signed)
Pt appeared to be sleeping all night.  He was noted sleeping sitting up in the bed snoring loudly.  He was not wearing his CPAP machine even though it was running all night long.  He was very restless all night long.

## 2017-10-07 NOTE — [facility] Group Notes (Signed)
[facility] Group Notes:  (Nursing)  Date:  10/07/2017  Time:  1:30 PM  Type of Therapy:  Nurse Education  Participation Level:  Active  Participation Quality:  Appropriate  Affect:  Appropriate  Cognitive:  Alert and Appropriate  Insight:  Appropriate  Engagement in Group:  Engaged  Modes of Intervention:  Discussion, Education and Exploration  Summary of Progress/Problems:  Group played a non competitive learning/communication board game that fosters listening skills as well as self expression.  Shela NevinValerie S Yan Pankratz 10/07/2017, 3:20 PM

## 2017-10-07 NOTE — Progress Notes (Signed)
Pt was observed in the dayroom attending wrap-up group. Pt participated. Pt was anxious/depressed in affect and mood. Pt denies SI/HI/AVH/Pain at this time. Pt is currently reminiscing about his life to Clinical research associatewriter. Pt states "All I do is work and sleep". Pt was encourage to identify and write down some short term goals this evening. Scedule meds given as ordered. Pt was encourage to use CPAP by bedside. No concerns/questions as this time. Will continue with POC.

## 2017-10-07 NOTE — Progress Notes (Signed)
[facility] MD Progress Note  10/07/2017 11:45 AM Andrew Noble  MRN:  [id number] Subjective: Patient is seen and examined.  Patient is a 22 year old male with a past psychiatric history significant for probable borderline personality disorder, bipolar disorder unspecified, posttraumatic stress disorder and chronic suicidal ideation.  He is seen in follow-up.  He stated he feels a bit better today.  He stated he had suicidal ideation this morning, but that has gone away for now.  His lithium level came back yesterday at 0.18.  He stated he had been compliant with his lithium at home.  His dosage on admission was increased to 600 mg p.o. nightly.  He denied any side effects of that.  He stated that he chronically suffers from daytime sedation, and that is when the reasons why is on the modafinil.  He stated he slept a little bit better last night.  He denied any side effects to his current medications. Principal Problem: <principal problem not specified> Diagnosis:   Patient Active Problem List   Diagnosis Date Noted  . Bipolar 1 disorder, depressed, severe (HCC) [F31.4] 10/05/2017  . Borderline personality disorder (HCC) [F60.3]   . Elevated liver enzymes [R74.8] 09/24/2015  . Obesity [E66.9] 09/24/2015  . Generalized anxiety disorder [F41.1] 03/05/2015  . Bipolar disorder with depression (HCC) [F31.9]   . Bipolar 1 disorder (HCC) [F31.9] 10/11/2013  . Mood disorder (HCC) [F39] 05/20/2011  . History of eating disorder [Z86.59] 05/20/2011   Total Time spent with patient: 20 minutes  Past Psychiatric History: See admission H&P  Past Medical History:  Past Medical History:  Diagnosis Date  . Abnormal laboratory test 02/12/2014  . Anxiety   . Bipolar disorder (HCC)   . Depression   . Hx of substance abuse 02/12/2014  . Psychosis [facility])     Past Surgical History:  Procedure Laterality Date  . TONSILLECTOMY AND ADENOIDECTOMY     Family History:  Family History  Problem Relation Age of Onset   . Depression Mother   . Anxiety disorder Mother   . Bipolar disorder Maternal Aunt    Family Psychiatric  History: See admission H&P Social History:  Social History   Substance and Sexual Activity  Alcohol Use Yes  . Alcohol/week: 0.6 - 1.2 oz  . Types: 1 - 2 Cans of beer per week   Comment: rare     Social History   Substance and Sexual Activity  Drug Use Yes  . Types: Marijuana   Comment: no longer using    Social History   Socioeconomic History  . Marital status: Single    Spouse name: Not on file  . Number of children: Not on file  . Years of education: Not on file  . Highest education level: Not on file  Occupational History  . Not on file  Social Needs  . Financial resource strain: Not on file  . Food insecurity:    Worry: Not on file    Inability: Not on file  . Transportation needs:    Medical: Not on file    Non-medical: Not on file  Tobacco Use  . Smoking status: Light Tobacco Smoker    Packs/day: 0.25    Types: Cigarettes  . Smokeless tobacco: Never Used  . Tobacco comment: Has cut back to only 1-2 a day and trying to quit  Substance and Sexual Activity  . Alcohol use: Yes    Alcohol/week: 0.6 - 1.2 oz    Types: 1 - 2 Cans of beer per  week    Comment: rare  . Drug use: Yes    Types: Marijuana    Comment: no longer using  . Sexual activity: Never  Lifestyle  . Physical activity:    Days per week: Not on file    Minutes per session: Not on file  . Stress: Not on file  Relationships  . Social connections:    Talks on phone: Not on file    Gets together: Not on file    Attends religious service: Not on file    Active member of club or organization: Not on file    Attends meetings of clubs or organizations: Not on file    Relationship status: Not on file  Other Topics Concern  . Not on file  Social History Narrative  . Not on file   Additional Social History:    Pain Medications: denies Prescriptions: denies Over the Counter:  denies History of alcohol / drug use?: No history of alcohol / drug abuse Longest period of sobriety (when/how long): denies                    Sleep: Fair  Appetite:  Fair  Current Medications: Current Facility-Administered Medications  Medication Dose Route Frequency Provider Last Rate Last Dose  . acetaminophen (TYLENOL) tablet 650 mg  650 mg Oral Q6H PRN Nira ConnBerry, Jason A, NP      . alum & mag hydroxide-simeth (MAALOX/MYLANTA) 200-200-20 MG/5ML suspension 30 mL  30 mL Oral Q4H PRN Nira ConnBerry, Jason A, NP      . feeding supplement (ENSURE ENLIVE) (ENSURE ENLIVE) liquid 237 mL  237 mL Oral BID BM Nira ConnBerry, Jason A, NP   237 mL at 10/06/17 1449  . FLUoxetine (PROZAC) capsule 20 mg  20 mg Oral Daily Antonieta Pertlary, Keiran Gaffey Lawson, MD   20 mg at 10/07/17 0900  . gabapentin (NEURONTIN) capsule 100 mg  100 mg Oral TID Antonieta Pertlary, Galen Malkowski Lawson, MD   100 mg at 10/07/17 0900  . lithium carbonate capsule 600 mg  600 mg Oral QHS Antonieta Pertlary, Sinai Mahany Lawson, MD   600 mg at 10/06/17 2112  . magnesium hydroxide (MILK OF MAGNESIA) suspension 30 mL  30 mL Oral Daily PRN Nira ConnBerry, Jason A, NP      . modafinil (PROVIGIL) tablet 100 mg  100 mg Oral Daily Nira ConnBerry, Jason A, NP   100 mg at 10/07/17 0900  . traZODone (DESYREL) tablet 50 mg  50 mg Oral QHS PRN Jackelyn PolingBerry, Jason A, NP        Lab Results:  Results for orders placed or performed during the hospital encounter of 10/05/17 (from the past 48 hour(s))  Lithium level     Status: Abnormal   Collection Time: 10/06/17  9:41 AM  Result Value Ref Range   Lithium Lvl 0.18 (L) 0.60 - 1.20 mmol/L    Comment: Performed at [facility], [address] [place], [place] [address]  TSH     Status: None   Collection Time: 10/06/17  6:30 PM  Result Value Ref Range   TSH 2.304 0.350 - 4.500 uIU/mL    Comment: Performed by a 3rd Generation assay with a functional sensitivity of <=0.01 uIU/mL. Performed at [facility], [address] [place], [place]  [id number]     Blood Alcohol level:  Lab Results  Component Value Date   ETH <10 10/04/2017    Metabolic Disorder Labs: No results found for: HGBA1C, MPG Lab Results  Component Value Date  PROLACTIN 5.0 04/29/2014   PROLACTIN 46.1 (H) 12/17/2013   No results found for: CHOL, TRIG, HDL, CHOLHDL, VLDL, LDLCALC  Physical Findings: AIMS: Facial and Oral Movements Muscles of Facial Expression: None, normal Lips and Perioral Area: None, normal Jaw: None, normal Tongue: None, normal,Extremity Movements Upper (arms, wrists, hands, fingers): None, normal Lower (legs, knees, ankles, toes): None, normal, Trunk Movements Neck, shoulders, hips: None, normal, Overall Severity Severity of abnormal movements (highest score from questions above): None, normal Incapacitation due to abnormal movements: None, normal Patient's awareness of abnormal movements (rate only patient's report): No Awareness, Dental Status Current problems with teeth and/or dentures?: No Does patient usually wear dentures?: No  CIWA:    COWS:     Musculoskeletal: Strength & Muscle Tone: within normal limits Gait & Station: normal Patient leans: N/A  Psychiatric Specialty Exam: Physical Exam  Nursing note and vitals reviewed. Constitutional: He is oriented to person, place, and time. He appears well-developed and well-nourished.  HENT:  Head: Normocephalic and atraumatic.  Respiratory: Effort normal.  Neurological: He is alert and oriented to person, place, and time.    ROS  Blood pressure 131/73, pulse 77, temperature 97.6 F (36.4 C), temperature source Oral, resp. rate 18, height 5\' 7"  (1.702 m), weight 59.9 kg (132 lb).Body mass index is 20.67 kg/m.  General Appearance: Casual  Eye Contact:  Fair  Speech:  Slow  Volume:  Decreased  Mood:  Anxious and Depressed  Affect:  Congruent  Thought Process:  Coherent  Orientation:  Full (Time, Place, and Person)  Thought Content:  Logical  Suicidal Thoughts:   Yes.  without intent/plan  Homicidal Thoughts:  No  Memory:  Immediate;   Fair Recent;   Fair Remote;   Fair  Judgement:  Intact  Insight:  Fair  Psychomotor Activity:  Normal  Concentration:  Concentration: Fair and Attention Span: Fair  Recall:  [employer] of Knowledge:  Fair  Language:  Good  Akathisia:  Negative  Handed:  Right  AIMS (if indicated):     Assets:  Desire for Improvement Financial Resources/Insurance Housing Resilience Social Support  ADL's:  Intact  Cognition:  WNL  Sleep:  Number of Hours: 6.75     Treatment Plan Summary: Daily contact with patient to assess and evaluate symptoms and progress in treatment, Medication management and Plan Patient is seen and examined.  Patient is a 22 year old male with the above-stated past psychiatric history seen in follow-up.  He is doing a little bit better today with the increased dose of fluoxetine as well as increased dose of the lithium.  I am not going to change any of his other medications currently.  His lithium level was only 0.18, kidney function was normal, thyroid function was normal.  Hopefully he will continue to improve on this regimen.  He denied any side effects to his current medications.  He did state he was suicidal earlier this morning, but that seems to have improved as well.  No other changes today.  Antonieta Pert, MD 10/07/2017, 11:45 AM

## 2017-10-07 NOTE — Progress Notes (Signed)
Adult Psychoeducational Group Note  Date:  10/07/2017 Time:  9:33 AM  Group Topic/Focus:  Goals Group:   The focus of this group is to help patients establish daily goals to achieve during treatment and discuss how the patient can incorporate goal setting into their daily lives to aide in recovery.  Participation Level:  Active  Participation Quality:  Appropriate  Affect:  Appropriate  Cognitive:  Alert  Insight: Good  Engagement in Group:  Engaged  Modes of Intervention:  Discussion and Support  Additional Comments:  Pt was active in group. Pt said he did not sleep with because he has sleep apnea. Pt goal for today is to work on self-esteem.  Ova FreshwaterChristina Deandra Goering 10/07/2017, 9:33 AM

## 2017-10-07 NOTE — [facility] Group Notes (Signed)
LCSW Group Therapy Note  10/07/2017   10:00--11:00am   Type of Therapy and Topic:  Group Therapy: Anger Cues and Responses  Participation Level:  Active   Description of Group:   In this group, patients learned how to recognize the physical, cognitive, emotional, and behavioral responses they have to anger-provoking situations.  They identified a recent time they became angry and how they reacted.  They analyzed how their reaction was possibly beneficial and how it was possibly unhelpful.  The group discussed a variety of healthier coping skills that could help with such a situation in the future.  Deep breathing was practiced briefly.  Therapeutic Goals: 1. Patients will remember their last incident of anger and how they felt emotionally and physically, what their thoughts were at the time, and how they  behaved. 2. Patients will identify how their behavior at that time worked for them, as well as how it worked against them. 3. Patients will explore possible new behaviors to use in future anger situations. 4. Patients will learn that anger itself is normal and cannot be eliminated, and that healthier reactions can assist with resolving conflict rather than worsening situations.  Summary of Patient Progress:  The patient shared that his most recent time of anger was when he argued with his step-father about him being unmotivated because his depression and "telling me to get up and do something like go to the gym". "I became frustrated yelled at him and got in my car and peeled off". The patient recognizes that it was positive to remove himself from the situation but could have changed the tenor of his discussion with his step-dad. He is able to articulate awareness of physical and emotional cues associated with anger. He reports that he will use his coping skills moving forward and identified exercise as an activity to he will employ as a coping mechanism.      Therapeutic Modalities:    Cognitive Behavioral Therapy  Henrene Dodgeonnie Marino Rogerson, LCSW  Evorn Gongonnie D Kyaire Gruenewald

## 2017-10-08 NOTE — Plan of Care (Signed)
  Problem: Activity: Goal: Interest or engagement in activities will improve Outcome: Progressing   

## 2017-10-08 NOTE — [facility] Group Notes (Signed)
[facility] LCSW Group Therapy Note  Date/Time:  10/08/2017 9:00-10:00 or 10:00-11:00AM  Type of Therapy and Topic:  Group Therapy:  Healthy and Unhealthy Supports  Participation Level:  Active   Description of Group:  Patients in this group were introduced to the idea of adding a variety of healthy supports to address the various needs in their lives.Patients discussed what additional healthy supports could be helpful in their recovery and wellness after discharge in order to prevent future hospitalizations.   An emphasis was placed on using counselor, doctor, therapy groups, 12-step groups, and problem-specific support groups to expand supports.  They also worked as a group on developing a specific plan for several patients to deal with unhealthy supports through boundary-setting, psychoeducation with loved ones, and even termination of relationships.   Therapeutic Goals:   1)  discuss importance of adding supports to stay well once out of the hospital  2)  compare healthy versus unhealthy supports and identify some examples of each  3)  generate ideas and descriptions of healthy supports that can be added  4)  offer mutual support about how to address unhealthy supports  5)  encourage active participation in and adherence to discharge plan    Summary of Patient Progress:  The patient stated that current healthy supports in his  life are family while current unhealthy supports include friends that engage in negative activities including drug use.Patient also describes co-workers on his job as a source of positive support  The patient expressed a willingness to keep adding positive people in his life as  positive supports to help in his recovery journey.   Therapeutic Modalities:   Motivational Interviewing Brief Solution-Focused Therapy  Andrew Dodgeonnie Amanat Hackel, LCSW  Andrew Noble

## 2017-10-08 NOTE — Progress Notes (Signed)
Patient has been up and active on the unit, attended group this evening and has voiced no complaints. Patient currently denies having pain, -si/hi/a/v hall. Support and encouragement offered, safety maintained on unit, will continue to monitor.  

## 2017-10-08 NOTE — Progress Notes (Signed)
Pt observed sitting up in bed, hunch over, and with eyes closed. Pt was awaken by staff and was encourage to put on CPAP.

## 2017-10-08 NOTE — Progress Notes (Signed)
Patient ID: Andrew Noble, male   DOB: Jun 02, 1995, 22 y.o.   MR[facility] [id number] [facility]:  (Nursing/MHT/Case Management/Adjunct)  Date:  10/08/2017  Time:  2:11 PM  Type of Therapy:  Psychoeducational Skills  Participation Level:  Active  Participation Quality:  Appropriate and Attentive  Affect:  Appropriate  Cognitive:  Alert and Oriented  Insight:  Improving  Engagement in Group:  Improving  Modes of Intervention:  Discussion and Education   Summary of Progress/Problems: Pt actively participated in RN led psychoeducational group today.  Andrew Noble, Andrew Noble 10/08/2017, 2:15 PM

## 2017-10-08 NOTE — Progress Notes (Signed)
D Pt is observed OOB UAL on the 400 hall today, he tolerates this fairly well. He endorses a flat, depressed affect and he says " I felt really bad when I first got here..I was really scared".     A HE completed his daily assessment and on it he wrote he deneid having SI today. He avoids making eye contact with this Clinical research associatewriter. He endorses a depressed , blunted affect.  He approached this writer this afternoon and asked to be moved to a different room, citing he felt uncomfortable around his roommmate. Pt was moved per his request and CN notified. He completed his daily assessment and on this he wrote  He deneid SI today and he rated his depression,  Hopelessness and anxiety " 3/3/5", respectively.      R Safety is in place and poc cont.

## 2017-10-08 NOTE — Progress Notes (Signed)
Adult Psychoeducational Group Note  Date:  10/08/2017 Time:  9:55 PM  Group Topic/Focus:  Wrap-Up Group:   The focus of this group is to help patients review their daily goal of treatment and discuss progress on daily workbooks.  Participation Level:  Active  Participation Quality:  Appropriate and Attentive  Affect:  Appropriate  Cognitive:  Alert, Appropriate and Oriented  Insight: Appropriate  Engagement in Group:  Engaged  Modes of Intervention:  Discussion and Education  Additional Comments:  Pt attended and participated in group. Pt stated his goal today was to remove himself from triggering situations. Pt reported completing his goal and rated his day a 5/10. Pt's goal tomorrow will be to work on being more appropriate during groups.   Berlin Hunuttle, Andrew Noble M 10/08/2017, 9:55 PM

## 2017-10-08 NOTE — Plan of Care (Signed)
  Problem: Education: Goal: Mental status will improve Outcome: Progressing   

## 2017-10-08 NOTE — Progress Notes (Signed)
[facility] Progress [facility]  10/08/2017 11:32 AM Andrew LuisRobert Noble  MRN:  [id number] Subjective: Patient is seen and examined.  Patient is a 22 year old male with the past psychiatric history significant for probable borderline personality disorder, bipolar disorder unspecified, posttraumatic stress disorder and chronic suicidal ideation.  He seen in follow-up.  He stated he feels better.  He is much less fatigued.  We are thinking that it was the Abilify.  He continues to have mild but intermittent suicidal ideation, but none since early this a.m.  He denied any side effects to his current medications.  We began to discuss his discharge planning.  His liver function enzymes were mildly elevated on admission, and those need to be repeated.  His TSH was 2.304.  Otherwise his vital signs are stable, and he remains afebrile. Principal Problem: <principal problem not specified> Diagnosis:   Patient Active Problem List   Diagnosis Date Noted  . Bipolar 1 disorder, depressed, severe (HCC) [F31.4] 10/05/2017  . Borderline personality disorder (HCC) [F60.3]   . Elevated liver enzymes [R74.8] 09/24/2015  . Obesity [E66.9] 09/24/2015  . Generalized anxiety disorder [F41.1] 03/05/2015  . Bipolar disorder with depression (HCC) [F31.9]   . Bipolar 1 disorder (HCC) [F31.9] 10/11/2013  . Mood disorder (HCC) [F39] 05/20/2011  . History of eating disorder [Z86.59] 05/20/2011   Total Time spent with patient: 20 minutes  Past Psychiatric History: See admission H&P  Past Medical History:  Past Medical History:  Diagnosis Date  . Abnormal laboratory test 02/12/2014  . Anxiety   . Bipolar disorder (HCC)   . Depression   . Hx of substance abuse 02/12/2014  . Psychosis [facility](HCC)     Past Surgical History:  Procedure Laterality Date  . TONSILLECTOMY AND ADENOIDECTOMY     Family History:  Family History  Problem Relation Age of Onset  . Depression Mother   . Anxiety disorder Mother   . Bipolar disorder Maternal Aunt     Family Psychiatric  History: See admission H&P Social History:  Social History   Substance and Sexual Activity  Alcohol Use Yes  . Alcohol/week: 0.6 - 1.2 oz  . Types: 1 - 2 Cans of beer per week   Comment: rare     Social History   Substance and Sexual Activity  Drug Use Yes  . Types: Marijuana   Comment: no longer using    Social History   Socioeconomic History  . Marital status: Single    Spouse name: Not on file  . Number of children: Not on file  . Years of education: Not on file  . Highest education level: Not on file  Occupational History  . Not on file  Social Needs  . Financial resource strain: Not on file  . Food insecurity:    Worry: Not on file    Inability: Not on file  . Transportation needs:    Medical: Not on file    Non-medical: Not on file  Tobacco Use  . Smoking status: Light Tobacco Smoker    Packs/day: 0.25    Types: Cigarettes  . Smokeless tobacco: Never Used  . Tobacco comment: Has cut back to only 1-2 a day and trying to quit  Substance and Sexual Activity  . Alcohol use: Yes    Alcohol/week: 0.6 - 1.2 oz    Types: 1 - 2 Cans of beer per week    Comment: rare  . Drug use: Yes    Types: Marijuana    Comment: no longer  using  . Sexual activity: Never  Lifestyle  . Physical activity:    Days per week: Not on file    Minutes per session: Not on file  . Stress: Not on file  Relationships  . Social connections:    Talks on phone: Not on file    Gets together: Not on file    Attends religious service: Not on file    Active member of club or organization: Not on file    Attends meetings of clubs or organizations: Not on file    Relationship status: Not on file  Other Topics Concern  . Not on file  Social History Narrative  . Not on file   Additional Social History:    Pain Medications: denies Prescriptions: denies Over the Counter: denies History of alcohol / drug use?: No history of alcohol / drug abuse Longest period of  sobriety (when/how long): denies                    Sleep: Good  Appetite:  Fair  Current Medications: Current Facility-Administered Medications  Medication Dose Route Frequency Provider Last Rate Last Dose  . acetaminophen (TYLENOL) tablet 650 mg  650 mg Oral Q6H PRN Nira Conn A, NP      . alum & mag hydroxide-simeth (MAALOX/MYLANTA) 200-200-20 MG/5ML suspension 30 mL  30 mL Oral Q4H PRN Nira Conn A, NP      . feeding supplement (ENSURE ENLIVE) (ENSURE ENLIVE) liquid 237 mL  237 mL Oral BID BM Nira Conn A, NP   237 mL at 10/06/17 1449  . FLUoxetine (PROZAC) capsule 20 mg  20 mg Oral Daily Antonieta Pert, MD   20 mg at 10/08/17 0752  . gabapentin (NEURONTIN) capsule 100 mg  100 mg Oral TID Antonieta Pert, MD   100 mg at 10/08/17 0752  . lithium carbonate capsule 600 mg  600 mg Oral QHS Antonieta Pert, MD   600 mg at 10/07/17 2108  . magnesium hydroxide (MILK OF MAGNESIA) suspension 30 mL  30 mL Oral Daily PRN Nira Conn A, NP      . modafinil (PROVIGIL) tablet 100 mg  100 mg Oral Daily Nira Conn A, NP   100 mg at 10/08/17 0755  . traZODone (DESYREL) tablet 50 mg  50 mg Oral QHS PRN Jackelyn Poling, NP        Lab Results:  Results for orders placed or performed during the hospital encounter of 10/05/17 (from the past 48 hour(s))  TSH     Status: None   Collection Time: 10/06/17  6:30 PM  Result Value Ref Range   TSH 2.304 0.350 - 4.500 uIU/mL    Comment: Performed by a 3rd Generation assay with a functional sensitivity of <=0.01 uIU/mL. Performed at [facility], [address] [place], [place] [address]     Blood Alcohol level:  Lab Results  Component Value Date   ETH <10 10/04/2017    Metabolic Disorder Labs: No results found for: HGBA1C, MPG Lab Results  Component Value Date   PROLACTIN 5.0 04/29/2014   PROLACTIN 46.1 (H) 12/17/2013   No results found for: CHOL, TRIG, HDL, CHOLHDL, VLDL, LDLCALC  Physical  Findings: AIMS: Facial and Oral Movements Muscles of Facial Expression: None, normal Lips and Perioral Area: None, normal Jaw: None, normal Tongue: None, normal,Extremity Movements Upper (arms, wrists, hands, fingers): None, normal Lower (legs, knees, ankles, toes): None, normal, Trunk Movements Neck, shoulders, hips: None, normal,  Overall Severity Severity of abnormal movements (highest score from questions above): None, normal Incapacitation due to abnormal movements: None, normal Patient's awareness of abnormal movements (rate only patient's report): No Awareness, Dental Status Current problems with teeth and/or dentures?: No Does patient usually wear dentures?: No  CIWA:    COWS:     Musculoskeletal: Strength & Muscle Tone: within normal limits Gait & Station: normal Patient leans: N/A  Psychiatric Specialty Exam: Physical Exam  Constitutional: He is oriented to person, place, and time. He appears well-developed and well-nourished.  HENT:  Head: Normocephalic and atraumatic.  Respiratory: Effort normal.  Neurological: He is alert and oriented to person, place, and time.    ROS  Blood pressure 130/85, pulse 97, temperature 97.6 F (36.4 C), temperature source Oral, resp. rate 18, height 5\' 7"  (1.702 m), weight 59.9 kg (132 lb).Body mass index is 20.67 kg/m.  General Appearance: Casual  Eye Contact:  Fair  Speech:  Normal Rate  Volume:  Normal  Mood:  Anxious  Affect:  Congruent  Thought Process:  Coherent  Orientation:  Full (Time, Place, and Person)  Thought Content:  Logical  Suicidal Thoughts:  No  Homicidal Thoughts:  No  Memory:  Immediate;   Fair Recent;   Fair Remote;   Fair  Judgement:  Intact  Insight:  Fair  Psychomotor Activity:  Normal  Concentration:  Concentration: Fair and Attention Span: Fair  Recall:  [employer] of Knowledge:  Fair  Language:  Fair  Akathisia:  Negative  Handed:  Right  AIMS (if indicated):     Assets:  Communication  Skills Desire for Improvement Financial Resources/Insurance Housing Physical Health Resilience Transportation  ADL's:  Intact  Cognition:  WNL  Sleep:  Number of Hours: 6.75     Treatment Plan Summary: Daily contact with patient to assess and evaluate symptoms and progress in treatment, Medication management and Plan Patient is seen and examined.  Patient is a 22 year old male with the above-stated past psychiatric history seen in follow-up.  He is doing better.  We will not change his fluoxetine, gabapentin or lithium today.  He also remains on modafinil.  We discussed possible discharge in 1 to 2 days.  He will need a lithium level done sometime after discharge.  No changes medications today.  Antonieta Pert, MD 10/08/2017, 11:32 AM

## 2017-10-09 MED ORDER — TRAZODONE HCL 50 MG PO TABS: 50.0000 mg | ORAL_TABLET | Freq: Every evening | ORAL | 0 refills | Status: DC | PRN

## 2017-10-09 MED ORDER — FLUOXETINE HCL 20 MG PO CAPS: 20.0000 mg | ORAL_CAPSULE | Freq: Every day | ORAL | 0 refills | Status: DC

## 2017-10-09 MED ORDER — GABAPENTIN 100 MG PO CAPS: 100.0000 mg | ORAL_CAPSULE | Freq: Three times a day (TID) | ORAL | 0 refills | Status: DC

## 2017-10-09 MED ORDER — LITHIUM CARBONATE 600 MG PO CAPS: 600.0000 mg | ORAL_CAPSULE | Freq: Every day | ORAL | 0 refills | Status: DC

## 2017-10-09 NOTE — Progress Notes (Signed)
Recreation Therapy Notes  Date: 6.24.19 Time: 0930 Location: [address] Dayroom  Group Topic: Stress Management  Goal Area(s) Addresses:  Patient will verbalize importance of using healthy stress management.  Patient will identify positive emotions associated with healthy stress management.   Intervention: Stress Management  Activity :  Guided Imagery.  LRT introduced the stress management technique of guided imagery to the group.  LRT read a script that took the patients on a journey to the beach.  Patients were to follow along as the script was read to engage in the activity.  Education:  Stress Management, Discharge Planning.   Education Outcome: Acknowledges edcuation/In group clarification offered/Needs additional education  Clinical Observations/Feedback: Pt did not attend group.      Caroll RancherMarjette Emileigh Kellett, LRT/CTRS         Caroll RancherLindsay, Raevin Wierenga A 10/09/2017 11:29 AM

## 2017-10-09 NOTE — Progress Notes (Signed)
  [facility] :  Will you be returning to the same living situation after discharge:  Yes,  patient reports he plans to return home to his apartment, where he lives alone.  At discharge, do you have transportation home?: Yes,  patient reports a close friend will pick him up at discharge Do you have the ability to pay for your medications: Yes,  BCBS, income from employment and support from parents  Release of information consent forms completed and in the chart;  Patient's signature needed at discharge.  Patient to Follow up at: Follow-up Information    BEHAVIORAL HEALTH OUTPATIENT THERAPY [place]. Go on 11/02/2017.   Specialty:  Behavioral Health Why:  Appointment for medication management with Dr. Rene KocherEksir is Thursday, 11/02/17 at 9:30am. Please be sure to bring any discharge paperwork from this hospitalization.  Contact information: [address] [id number] mc [place] [id number] [phone]       Vilinda BlanksKirch, Michael, PhD. [facility]esGo on 10/16/2017.   Specialty:  Behavioral Health Why:  Appointment for therapy services is Monday,  10/16/17 at 9:00am with Dr. Vilinda BlanksMichael Kirch, PhD. Please be sure to take any discharge paperwork from this hospitalization.  Contact information: [address] [place] [id number] [phone]           Next level of care provider has access to [facility] Link:yes  Safety Planning and Suicide Prevention discussed: Yes,  with the patient's mother  Have you used any form of tobacco in the last 30 days? (Cigarettes, Smokeless Tobacco, Cigars, and/or Pipes): No  Has patient been referred to the Quitline?: N/A patient is not a smoker  Patient has been referred for addiction treatment: N/A  Maeola SarahJolan E Stevey Stapleton, LCSWA 10/09/2017, 10:07 AM

## 2017-10-09 NOTE — [facility] Suicide Risk Assessment (Signed)
[facility]   Principal Problem: <principal problem not specified> Discharge Diagnoses:  Patient Active Problem List   Diagnosis Date Noted  . Bipolar 1 disorder, depressed, severe (HCC) [F31.4] 10/05/2017  . Borderline personality disorder (HCC) [F60.3]   . Elevated liver enzymes [R74.8] 09/24/2015  . Obesity [E66.9] 09/24/2015  . Generalized anxiety disorder [F41.1] 03/05/2015  . Bipolar disorder with depression (HCC) [F31.9]   . Bipolar 1 disorder (HCC) [F31.9] 10/11/2013  . Mood disorder (HCC) [F39] 05/20/2011  . History of eating disorder [Z86.59] 05/20/2011    Total Time spent with patient: 30 minutes  Musculoskeletal: Strength & Muscle Tone: within normal limits Gait & Station: normal Patient leans: N/A  Psychiatric Specialty Exam: Review of Systems  All other systems reviewed and are negative.   Blood pressure 130/85, pulse 97, temperature 97.6 F (36.4 C), temperature source Oral, resp. rate 18, height 5\' 7"  (1.702 m), weight 59.9 kg (132 lb).Body mass index is 20.67 kg/m.  General Appearance: Casual  Eye Contact::  Good  Speech:  Normal Rate409  Volume:  Normal  Mood:  Euthymic  Affect:  Congruent  Thought Process:  Coherent  Orientation:  Full (Time, Place, and Person)  Thought Content:  Logical  Suicidal Thoughts:  No  Homicidal Thoughts:  No  Memory:  Immediate;   Fair Recent;   Fair Remote;   Fair  Judgement:  Intact  Insight:  Good  Psychomotor Activity:  Normal  Concentration:  Good  Recall:  Good  Fund of Knowledge:Good  Language: Good  Akathisia:  Negative  Handed:  Right  AIMS (if indicated):     Assets:  Desire for Improvement Housing Physical Health Resilience Social Support  Sleep:  Number of Hours: 5.5  Cognition: WNL  ADL's:  Intact   Mental Status Per Nursing Assessment::   On Admission:  Suicidal ideation indicated by patient, Suicide plan, Self-harm thoughts, Intention to act on suicide  plan  Demographic Factors:  Male and Adolescent or young adult  Loss Factors: NA  Historical Factors: Impulsivity  Risk Reduction Factors:   Sense of responsibility to family, Employed, Positive social support and Positive coping skills or problem solving skills  Continued Clinical Symptoms:  Depression:   Impulsivity  Cognitive Features That Contribute To Risk:  None    Suicide Risk:  Minimal: No identifiable suicidal ideation.  Patients presenting with no risk factors but with morbid ruminations; may be classified as minimal risk based on the severity of the depressive symptoms    Plan Of Care/Follow-up recommendations:  Activity:  ad lib  Antonieta PertGreg Lawson Arrianna Catala, MD 10/09/2017, 7:35 AM

## 2017-10-09 NOTE — Progress Notes (Signed)
Patient ID: Andrew Noble, male   DOB: [birth year], 22 y.o.   MRN: [id number]  Pt discharged to lobby. Pt was stable and appreciative at that time. All papers and prescriptions were given and valuables returned. Verbal understanding expressed. Denies SI/HI and A/VH. Pt given opportunity to express concerns and ask questions.

## 2017-10-10 NOTE — Discharge Summary (Signed)
Physician Discharge Summary Note  Patient:  Andrew Noble is an 22 y.o., male MRN:  [id number] DOB:  1995/10/03 Patient phone:  [phone] (home)  Patient address:   [address] [place] [facility] [address],  Total Time spent with patient: 20 minutes  Date of Admission:  10/05/2017 Date of Discharge: 10/10/2017  Reason for Admission: Per assessment note- Patient is seen and examined.  Patient is a 22 year old male with a past psychiatric history significant for probable borderline personality disorder versus bipolar disorder, posttraumatic stress disorder and chronic suicidal thoughts.  The patient stated that he had been feeling more significantly depressed over the last 2 weeks.  He stated his thoughts about harming himself had increased as well.  The patient stated he had had suicidal thoughts basically all the time, but had worsened recently.  He had seen his outpatient psychiatrist a couple of weeks ago he started lithium 300 mg p.o. daily.  The patient was unsure whether or not this had been beneficial.  When the suicidal thoughts had worsened he presented to a [facility] and was evaluated in the emergency room by psychiatry on 6/16.  Fluoxetine was added to 10 mg p.o. daily.  Unfortunately a lithium level was not in that record.  He stated that over the next 24 hours things continue to worsen.  He began to think about driving off a cliff, or shooting himself.  He then presented to the [facility] emergency department for evaluation.  He admitted to suicidal ideation, worsening mood, feeling helpless, hopeless and worthless.  The decision was made to admit him to the hospital for evaluation and stabilization.  He stated that he felt as though he had had suicidal thoughts since he was approximately 22 years of age.  He stated that a couple years ago he went without medications for over a year, but "the year was "disastrous   Principal Problem: Bipolar 1 disorder,  depressed, severe (HCC) Discharge Diagnoses: Patient Active Problem List   Diagnosis Date Noted  . Bipolar 1 disorder, depressed, severe (HCC) [F31.4] 10/05/2017  . Borderline personality disorder (HCC) [F60.3]   . Elevated liver enzymes [R74.8] 09/24/2015  . Obesity [E66.9] 09/24/2015  . Generalized anxiety disorder [F41.1] 03/05/2015  . Bipolar disorder with depression (HCC) [F31.9]   . Bipolar 1 disorder (HCC) [F31.9] 10/11/2013  . Mood disorder (HCC) [F39] 05/20/2011  . History of eating disorder [Z86.59] 05/20/2011    Past Psychiatric History:   Past Medical History:  Past Medical History:  Diagnosis Date  . Abnormal laboratory test 02/12/2014  . Anxiety   . Bipolar disorder (HCC)   . Depression   . Hx of substance abuse 02/12/2014  . Psychosis [facility])     Past Surgical History:  Procedure Laterality Date  . TONSILLECTOMY AND ADENOIDECTOMY     Family History:  Family History  Problem Relation Age of Onset  . Depression Mother   . Anxiety disorder Mother   . Bipolar disorder Maternal Aunt    Family Psychiatric  History: Social History:  Social History   Substance and Sexual Activity  Alcohol Use Yes  . Alcohol/week: 0.6 - 1.2 oz  . Types: 1 - 2 Cans of beer per week   Comment: rare     Social History   Substance and Sexual Activity  Drug Use Yes  . Types: Marijuana   Comment: no longer using    Social History   Socioeconomic History  . Marital status: Single    Spouse  name: Not on file  . Number of children: Not on file  . Years of education: Not on file  . Highest education level: Not on file  Occupational History  . Not on file  Social Needs  . Financial resource strain: Not on file  . Food insecurity:    Worry: Not on file    Inability: Not on file  . Transportation needs:    Medical: Not on file    Non-medical: Not on file  Tobacco Use  . Smoking status: Light Tobacco Smoker    Packs/day: 0.25    Types: Cigarettes  . Smokeless  tobacco: Never Used  . Tobacco comment: Has cut back to only 1-2 a day and trying to quit  Substance and Sexual Activity  . Alcohol use: Yes    Alcohol/week: 0.6 - 1.2 oz    Types: 1 - 2 Cans of beer per week    Comment: rare  . Drug use: Yes    Types: Marijuana    Comment: no longer using  . Sexual activity: Never  Lifestyle  . Physical activity:    Days per week: Not on file    Minutes per session: Not on file  . Stress: Not on file  Relationships  . Social connections:    Talks on phone: Not on file    Gets together: Not on file    Attends religious service: Not on file    Active member of club or organization: Not on file    Attends meetings of clubs or organizations: Not on file    Relationship status: Not on file  Other Topics Concern  . Not on file  Social History Narrative  . Not on file    Hospital Course:  Montel Vanderhoof was admitted for Bipolar 1 disorder, depressed, severe (HCC)  and crisis management.  Pt was treated discharged with the medications listed below under Medication List.  Medical problems were identified and treated as needed.  Home medications were restarted as appropriate.  Improvement was monitored by observation and Darrin Luis 's daily report of symptom reduction.  Emotional and mental status was monitored by daily self-inventory reports completed by Darrin Luis and clinical staff.         Darrin Luis was evaluated by the treatment team for stability and plans for continued recovery upon discharge. Tacoma Merida 's motivation was an integral factor for scheduling further treatment. Employment, transportation, bed availability, health status, family support, and any pending legal issues were also considered during hospital stay. Pt was offered further treatment options upon discharge including but not limited to Residential, Intensive Outpatient, and Outpatient treatment.  Marie Chow will follow up with the services as listed below under Follow  Up Information.     Upon completion of this admission the patient was both mentally and medically stable for discharge denying suicidal/homicidal ideation, auditory/visual/tactile hallucinations, delusional thoughts and paranoia.    Darrin Luis responded well to treatment with Prozac 20 mg, Trazodone 50 mg  Gabapentin 100 mg and Lithium 600 mg without adverse effects. Initially, pt did complain of headaches with these medications, but this resolved. Pt demonstrated improvement without reported or observed adverse effects to the point of stability appropriate for outpatient management. Pertinent labs include: lithium level 0.18 for which outpatient follow-up is necessary for lab recheck as mentioned below. Reviewed CBC, CMP, BAL, and UDS; all unremarkable aside from noted exceptions.   Physical Findings: AIMS: Facial and Oral Movements Muscles of Facial Expression: None, normal  Lips and Perioral Area: None, normal Jaw: None, normal Tongue: None, normal,Extremity Movements Upper (arms, wrists, hands, fingers): None, normal Lower (legs, knees, ankles, toes): None, normal, Trunk Movements Neck, shoulders, hips: None, normal, Overall Severity Severity of abnormal movements (highest score from questions above): None, normal Incapacitation due to abnormal movements: None, normal Patient's awareness of abnormal movements (rate only patient's report): No Awareness, Dental Status Current problems with teeth and/or dentures?: No Does patient usually wear dentures?: No  CIWA:    COWS:     Musculoskeletal: Strength & Muscle Tone: within normal limits Gait & Station: normal Patient leans: N/A  Psychiatric Specialty Exam: See SRA by MD  Physical Exam  Nursing note and vitals reviewed. Psychiatric: He has a normal mood and affect. His behavior is normal.    Review of Systems  Psychiatric/Behavioral: Negative for depression (stable). The patient is not nervous/anxious.   All other systems  reviewed and are negative.   Blood pressure 130/85, pulse 97, temperature 97.6 F (36.4 C), temperature source Oral, resp. rate 18, height 5\' 7"  (1.702 m), weight 132 lb (59.9 kg).Body mass index is 20.67 kg/m.   Have you used any form of tobacco in the last 30 days? (Cigarettes, Smokeless Tobacco, Cigars, and/or Pipes): No  Has this patient used any form of tobacco in the last 30 days? (Cigarettes, Smokeless Tobacco, Cigars, and/or Pipes)  No  Blood Alcohol level:  Lab Results  Component Value Date   ETH <10 10/04/2017    Metabolic Disorder Labs:  No results found for: HGBA1C, MPG Lab Results  Component Value Date   PROLACTIN 5.0 04/29/2014   PROLACTIN 46.1 (H) 12/17/2013   No results found for: CHOL, TRIG, HDL, CHOLHDL, VLDL, LDLCALC  See Psychiatric Specialty Exam and Suicide Risk Assessment completed by Attending Physician prior to discharge.  Discharge destination:  Home  Is patient on multiple antipsychotic therapies at discharge:  No   Has Patient had three or more failed trials of antipsychotic monotherapy by history:  No  Recommended Plan for Multiple Antipsychotic Therapies: NA  Discharge Instructions    Diet - low sodium heart healthy   Complete by:  As directed    Increase activity slowly   Complete by:  As directed      Allergies as of 10/09/2017   No Known Allergies     Medication List    STOP taking these medications   ARIPiprazole ER 400 MG Prsy prefilled syringe Commonly known as:  ABILIFY MAINTENA   lithium 300 MG tablet Replaced by:  lithium 600 MG capsule   modafinil 100 MG tablet Commonly known as:  PROVIGIL     TAKE these medications     Indication  FLUoxetine 20 MG capsule Commonly known as:  PROZAC Take 1 capsule (20 mg total) by mouth daily. What changed:    medication strength  how much to take  Indication:  Depressive Phase of Manic-Depression   gabapentin 100 MG capsule Commonly known as:  NEURONTIN Take 1 capsule (100  mg total) by mouth 3 (three) times daily.  Indication:  Alcohol Withdrawal Syndrome   lithium 600 MG capsule Take 1 capsule (600 mg total) by mouth at bedtime. Replaces:  lithium 300 MG tablet  Indication:  Depression   traZODone 50 MG tablet Commonly known as:  DESYREL Take 1 tablet (50 mg total) by mouth at bedtime as needed for sleep.  Indication:  Anxiety Disorder      Follow-up Information    [facility] OUTPAT[facility]Y  [place]. Go on 11/02/2017.   Specialty:  Behavioral Health Why:  Appointment for medication management with Dr. Rene Kocher is Thursday, 11/02/17 at 9:30am. Please be sure to bring any discharge paperwork from this hospitalization.  Contact information: [address] [id number] mc [place] [address] [phone]       Vilinda Blanks, PhD. Nyra Capes on 10/16/2017.   Specialty:  Behavioral Health Why:  Appointment for therapy services is Monday,  10/16/17 at 9:00am with Dr. Vilinda Blanks, PhD. Please be sure to take any discharge paperwork from this hospitalization.  Contact information: [address] [place] [address] [phone]           Follow-up recommendations:  Activity:  as tolerated Diet:  heart healthy Tests:  repeat Li levl in 3weeks   Comments: Take all medications as prescribed. Keep all follow-up appointments as scheduled.  Do not consume alcohol or use illegal drugs while on prescription medications. Report any adverse effects from your medications to your primary care provider promptly.  In the event of recurrent symptoms or worsening symptoms, call 911, a crisis hotline, or go to the nearest emergency department for evaluation.   Signed: Oneta Rack, NP 10/10/2017, 9:19 AM

## 2017-10-16 DIAGNOSIS — F319 Bipolar disorder, unspecified: Secondary | ICD-10-CM | POA: Diagnosis not present

## 2017-10-23 ENCOUNTER — Telehealth (HOSPITAL_COMMUNITY): Payer: Self-pay

## 2017-10-23 NOTE — Telephone Encounter (Signed)
Patient called saying that over the past week weeks he has noticed increased shaking in his right hand and right leg. He thinks he maybe coming from his increase in medication but he is uncertain? Next scheduled appointment is 11-02-17 Please advise

## 2017-10-23 NOTE — Telephone Encounter (Signed)
He was inpatient recently his medicines were changed.  I haven't seen him since then. He should come in the morning tomorrow or Wednesday for a lithium level and CMP and we can make sure the levels aren't too high. Lithium can still cause tremulousness even if the levels are appropriate.  It does take time to accommodate to the medicine so we can follow up next week as scheduled otherwise.

## 2017-10-24 ENCOUNTER — Other Ambulatory Visit (HOSPITAL_COMMUNITY): Payer: Self-pay

## 2017-10-24 DIAGNOSIS — F319 Bipolar disorder, unspecified: Secondary | ICD-10-CM

## 2017-10-24 NOTE — Telephone Encounter (Signed)
Called and spoke with patient and informed him of the message.

## 2017-10-25 LAB — SPECIMEN STATUS REPORT

## 2017-10-25 LAB — LITHIUM LEVEL: LITHIUM LVL: 0.2 mmol/L — AB (ref 0.6–1.2)

## 2017-10-29 ENCOUNTER — Other Ambulatory Visit (HOSPITAL_COMMUNITY): Payer: Self-pay | Admitting: Psychiatry

## 2017-10-30 DIAGNOSIS — F319 Bipolar disorder, unspecified: Secondary | ICD-10-CM | POA: Diagnosis not present

## 2017-11-02 ENCOUNTER — Ambulatory Visit (INDEPENDENT_AMBULATORY_CARE_PROVIDER_SITE_OTHER): Payer: BLUE CROSS/BLUE SHIELD | Admitting: Psychiatry

## 2017-11-02 ENCOUNTER — Encounter (HOSPITAL_COMMUNITY): Payer: Self-pay | Admitting: Psychiatry

## 2017-11-02 VITALS — BP 125/76 | HR 83 | Ht 67.0 in | Wt 289.0 lb

## 2017-11-02 DIAGNOSIS — Z8659 Personal history of other mental and behavioral disorders: Secondary | ICD-10-CM

## 2017-11-02 DIAGNOSIS — F603 Borderline personality disorder: Secondary | ICD-10-CM

## 2017-11-02 DIAGNOSIS — F411 Generalized anxiety disorder: Secondary | ICD-10-CM

## 2017-11-02 MED ORDER — FLUOXETINE HCL 40 MG PO CAPS: 40.0000 mg | ORAL_CAPSULE | Freq: Every day | ORAL | 0 refills | Status: DC

## 2017-11-02 MED ORDER — PROPRANOLOL HCL ER 60 MG PO CP24: 60.0000 mg | ORAL_CAPSULE | Freq: Every day | ORAL | 0 refills | Status: DC

## 2017-11-02 NOTE — Progress Notes (Signed)
BH MD/PA/NP OP Progress Note  11/02/2017 10:13 AM Andrew Noble  MRN:  [id number]  Chief Complaint: hospital discharge  HPI: Andrew Noble reports that the hospitalization was helpful for the crisis, and he no longer has any sort of suicidal thoughts or plans.  He does feel like the increase in lithium and initiation of Prozac was helpful.  We agreed to increase Prozac further to 40 mg, and maintain the current dose of lithium.  He continues to have some tremulousness from the lithium, and his labs appear normal.  I suggested we use a low-dose of long-acting propranolol to help with tremulousness and ongoing social anxiety.  He continues with dysphoria and depressive symptoms, and I encouraged him to keep his scheduled appointment next week for his CPAP visit with pulmonology, and [facility].    We also discussed additional treatment options including transcranial magnetic stimulation and he would like to read more about this and consider this in the future if needed.  Visit Diagnosis:    ICD-10-CM   1. Borderline personality disorder (HCC) F60.3 FLUoxetine (PROZAC) 40 MG capsule    propranolol ER (INDERAL LA) 60 MG 24 hr capsule  2. History of eating disorder Z86.59 FLUoxetine (PROZAC) 40 MG capsule  3. Generalized anxiety disorder F41.1 FLUoxetine (PROZAC) 40 MG capsule    propranolol ER (INDERAL LA) 60 MG 24 hr capsule    Past Psychiatric History: See intake H&P for full details. Reviewed, with no updates at this time.  Past Medical History:  Past Medical History:  Diagnosis Date  . Abnormal laboratory test 02/12/2014  . Anxiety   . Bipolar disorder (HCC)   . Depression   . Hx of substance abuse 02/12/2014  . Psychosis [facility](HCC)     Past Surgical History:  Procedure Laterality Date  . TONSILLECTOMY AND ADENOIDECTOMY      Family Psychiatric History: See intake H&P for full details. Reviewed, with no updates at this time.   Family History:   Family History  Problem Relation Age of Onset  . Depression Mother   . Anxiety disorder Mother   . Bipolar disorder Maternal Aunt     Social History:  Social History   Socioeconomic History  . Marital status: Single    Spouse name: Not on file  . Number of children: Not on file  . Years of education: Not on file  . Highest education level: Not on file  Occupational History  . Not on file  Social Needs  . Financial resource strain: Not on file  . Food insecurity:    Worry: Not on file    Inability: Not on file  . Transportation needs:    Medical: Not on file    Non-medical: Not on file  Tobacco Use  . Smoking status: Light Tobacco Smoker    Packs/day: 0.25    Types: Cigarettes  . Smokeless tobacco: Never Used  . Tobacco comment: Has cut back to only 1-2 a day and trying to quit  Substance and Sexual Activity  . Alcohol use: Yes    Alcohol/week: 0.6 - 1.2 oz    Types: 1 - 2 Cans of beer per week    Comment: rare  . Drug use: Yes    Types: Marijuana    Comment: no longer using  . Sexual activity: Never  Lifestyle  . Physical activity:    Days per week: Not on file    Minutes per session: Not on file  . Stress: Not  on file  Relationships  . Social connections:    Talks on phone: Not on file    Gets together: Not on file    Attends religious service: Not on file    Active member of club or organization: Not on file    Attends meetings of clubs or organizations: Not on file    Relationship status: Not on file  Other Topics Concern  . Not on file  Social History Narrative  . Not on file    Allergies: No Known Allergies  Metabolic Disorder Labs: No results found for: HGBA1C, MPG Lab Results  Component Value Date   PROLACTIN 5.0 04/29/2014   PROLACTIN 46.1 (H) 12/17/2013   No results found for: CHOL, TRIG, HDL, CHOLHDL, VLDL, LDLCALC Lab Results  Component Value Date   TSH 2.304 10/06/2017    Therapeutic Level Labs: Lab Results  Component Value  Date   LITHIUM 0.2 (L) 10/24/2017   LITHIUM 0.18 (L) 10/06/2017   Lab Results  Component Value Date   VALPROATE 84.5 10/31/2014   VALPROATE 83.4 04/29/2014   No components found for:  CBMZ  Current Medications: Current Outpatient Medications  Medication Sig Dispense Refill  . FLUoxetine (PROZAC) 40 MG capsule Take 1 capsule (40 mg total) by mouth daily. 90 capsule 0  . lithium carbonate 600 MG capsule Take 1 capsule (600 mg total) by mouth at bedtime. 30 capsule 0  . propranolol ER (INDERAL LA) 60 MG 24 hr capsule Take 1 capsule (60 mg total) by mouth daily. 90 capsule 0   No current facility-administered medications for this visit.      Musculoskeletal: Strength & Muscle Tone: within normal limits Gait & Station: normal Patient leans: N/A  Psychiatric Specialty Exam: ROS  Blood pressure 125/76, pulse 83, height 5\' 7"  (1.702 m), weight 289 lb (131.1 kg).Body mass index is 45.26 kg/m.  General Appearance: Casual and Fairly Groomed  Eye Contact:  better  Speech:  Clear and Coherent and Normal Rate  Volume:  Normal  Mood:  Depressed and Dysphoric  Affect:  Congruent  Thought Process:  Coherent and Descriptions of Associations: Intact  Orientation:  Full (Time, Place, and Person)  Thought Content: Logical   Suicidal Thoughts:  No  Homicidal Thoughts:  No  Memory:  Immediate;   Fair  Judgement:  Fair  Insight:  Fair  Psychomotor Activity:  Normal  Concentration:  Concentration: Good  Recall:  Good  Fund of Knowledge: Good  Language: Good  Akathisia:  Negative  Handed:  Right  AIMS (if indicated): not done  Assets:  Special educational needs teacher  ADL's:  Intact  Cognition: WNL  Sleep:  Good   Screenings: AIMS     [facility]  AIMS Total Score  0    AUDIT     Admission (Discharged[facility]  Alcohol Use Disorder Identification Test Final Score (AUDIT)  0    GAD-7     Office Visit from 09/22/2016 in [facility] AT [place]  Total GAD-7 Score  21    PHQ2-9     Office Visit from 09/22/2016 in [facility] AT [place] Office Visit from 04/21/2016 in [facility] AT [place]  PHQ-2 Total Score  5  5  PHQ-9 Total Score  23  21       Assessment and Plan:  Andrew Noble presents with  slight improvements in his mood and depressive symptoms, and seems to be tolerating the Prozac and lithium combination fairly well.  He has not been taking trazodone and finds gabapentin to be useless.  We agreed to discontinue both of those and initiate propranolol to help with some of the tremulousness he has had from lithium.  Lithium labs reviewed as below, as I have gotten to know him, my suspicion of bipolar depression has been reduced, and I am not concerned about the lithium level being in the 0.6-1 range.  Rather I think this should be used as a augmenting dose for loading an appropriate dose of Prozac.  I spent time with him educating him about my diagnostic impression and we agreed to proceed as below.  We will follow-up in 4 weeks or sooner if needed.  Lab Results  Component Value Date   LITHIUM 0.2 (L) 10/24/2017   NA 141 10/04/2017   BUN 11 10/04/2017   CREATININE 1.09 10/04/2017   TSH 2.304 10/06/2017   WBC 12.0 (H) 10/04/2017    1. Borderline personality disorder (HCC)   2. History of eating disorder   3. Generalized anxiety disorder     Status of current problems: unchanged  Labs Ordered: No orders of the defined types were placed in this encounter.   Labs Reviewed: n/a  Collateral Obtained/Records Reviewed: n/a  Plan:  Continue Prozac, increase to 40 mg daily lithium 600 mg nightly Patient has been referred to [place] [facility] services for DBT Propranol 60 mg LA; reviewed the risks and  benefits of beta-blocker  rtc 4 weeks  Burnard Leigh, MD 11/02/2017, 10:13 AM

## 2017-11-09 ENCOUNTER — Ambulatory Visit (INDEPENDENT_AMBULATORY_CARE_PROVIDER_SITE_OTHER): Payer: BLUE CROSS/BLUE SHIELD | Admitting: Pulmonary Disease

## 2017-11-09 ENCOUNTER — Encounter: Payer: Self-pay | Admitting: Pulmonary Disease

## 2017-11-09 VITALS — BP 121/81 | HR 71 | Ht 67.0 in | Wt 286.0 lb

## 2017-11-09 DIAGNOSIS — Z6841 Body Mass Index (BMI) 40.0 and over, adult: Secondary | ICD-10-CM | POA: Diagnosis not present

## 2017-11-09 DIAGNOSIS — G4733 Obstructive sleep apnea (adult) (pediatric): Secondary | ICD-10-CM | POA: Diagnosis not present

## 2017-11-09 NOTE — Patient Instructions (Signed)
Will arrange for CPAP set up again  Follow up in 2 months after getting CPAP

## 2017-11-09 NOTE — Progress Notes (Signed)
  Subjective:     Patient ID: Andrew Andrew Noble, male   DOB: [birth year], 22 y.o.   MRN: [id number]  HPI   Review of Systems  Constitutional: Positive for fatigue. Negative for fever and unexpected weight change.  HENT: Negative for congestion, dental problem, ear pain, nosebleeds, postnasal drip, rhinorrhea, sinus pressure, sneezing, sore throat and trouble swallowing.   Eyes: Negative for redness and itching.  Respiratory: Negative for cough, chest tightness, shortness of breath and wheezing.   Cardiovascular: Negative for palpitations and leg swelling.  Gastrointestinal: Negative for nausea and vomiting.  Genitourinary: Negative for dysuria.  Musculoskeletal: Negative for joint swelling.  Skin: Negative for rash.  Neurological: Negative for headaches.  Hematological: Does not bruise/bleed easily.  Psychiatric/Behavioral: Negative for dysphoric mood. The patient is not nervous/anxious.        Objective:   Physical Exam     Assessment:        Plan:

## 2017-11-09 NOTE — Progress Notes (Signed)
[facility]  Chief Complaint  Patient presents with  . sleep consult    Referred by Dr. Rene KocherEksir for OSA.  Sleep study in Epic from 05/2017.  Epworth score: 18.    Constitutional: BP 121/81 (BP Location: Right Arm, Cuff Size: Large)   Pulse 71   Ht 5\' 7"  (1.702 m)   Wt 286 lb (129.7 kg)   SpO2 96%   BMI 44.79 kg/m   History of Present Illness: Andrew Noble is a 22 y.o. male with obstructive sleep apnea.  He had a sleep study in February 2019.  Very severe sleep apnea.  He was started on CPAP.  He had trouble with mask coming off at night.  He was threatened by DME that he would have to pay for machine if he didn't keep mask on.  He got nervous and turned machine back in.  He tried full face mask and nasal mask.  He isn't sure if CPAP helped.  He didn't sleep much with it.  He didn't have any trouble with pressure from machine.  His mom is worried that he doesn't breath while asleep.  He snores loudly, and wakes up feeling choked.  He has trouble sleeping on his back and gets a dry mouth at night.  He goes to sleep at 1130 pm.  He falls asleep immediately.  He wakes up some times to use the bathroom.  He gets out of bed at 1 pm.  He feels tired after waking up.  He denies morning headache.  He does not use anything to help him fall sleep or stay awake.  He denies sleep walking, sleep talking, bruxism, or nightmares.  There is no history of restless legs.  He denies sleep hallucinations, sleep paralysis, or cataplexy.  The Epworth score is 18 out of 24.   Comprehensive Respiratory Exam:  Appearance - well kempt  ENMT - nasal mucosa moist, turbinates clear, midline nasal septum, no dental lesions, no gingival bleeding, no oral exudates, no tonsillar hypertrophy, MP 4 Neck - no masses, trachea midline, no thyromegaly, no elevation in JVP Respiratory - normal appearance of chest wall, normal respiratory effort w/o accessory muscle use, no dullness  on percussion, no tactile fremitus, no wheezing or rales CV - s1s2 regular rate and rhythm, no murmurs, no peripheral edema, no varicosities, radial pulses symmetric GI - soft, non tender, no masses, no hepatosplenomegaly Lymph - no adenopathy noted in neck and axillary areas MSK - normal muscle strength and tone, normal gait Ext - no cyanosis, clubbing, or joint inflammation noted Skin - no rashes, lesions, or ulcers Neuro - oriented to person, place, and time Psych - normal mood and affect  Discussion: He has snoring, sleep disruption, apnea, and daytime sleepiness.  His BMI is > 35.  He has history of mood disordered.  His recently sleep study showed very severe sleep apnea.  He had trouble with initial CPAP set up, but wants to try getting back on CPAP again.  Assessment/Plan:  Obstructive sleep apnea. - if the sleep study shows significant sleep apnea, then various therapies for treatment were reviewed: CPAP, oral appliance, and surgical interventions - will arrange for auto CPAP 8 to 20 cm H2O - discussed options to improve mask fit - might need repeat diagnostic study for insurance coverage - might need in lab titration study if he still has trouble adjusting to CPAP  Obesity. - discussed how weight can impact sleep and risk for sleep disordered  breathing - discussed options to assist with weight loss: combination of diet modification, cardiovascular and strength training exercises  Cardiovascular risk. - had an extensive discussion regarding the adverse health consequences related to untreated sleep disordered breathing - specifically discussed the risks for hypertension, coronary artery disease, cardiac dysrhythmias, cerebrovascular disease, and diabetes - lifestyle modification discussed  Safe driving practices. - discussed how sleep disruption can increase risk of accidents, particularly when driving - safe driving practices were discussed   Patient Instructions  Will  arrange for CPAP set up again  Follow up in 2 months after getting CPAP    Coralyn Helling, MD [facility]/Critical Care 11/09/2017, 10:40 AM  Flow Sheet  Sleep tests: HST 05/30/17 >> AHI 119.4, SaO2 low 73%  Review of Systems:  Constitutional: Positive for fatigue. Negative for fever and unexpected weight change.  HENT: Negative for congestion, dental problem, ear pain, nosebleeds, postnasal drip, rhinorrhea, sinus pressure, sneezing, sore throat and trouble swallowing.   Eyes: Negative for redness and itching.  Respiratory: Negative for cough, chest tightness, shortness of breath and wheezing.   Cardiovascular: Negative for palpitations and leg swelling.  Gastrointestinal: Negative for nausea and vomiting.  Genitourinary: Negative for dysuria.  Musculoskeletal: Negative for joint swelling.  Skin: Negative for rash.  Neurological: Negative for headaches.  Hematological: Does not bruise/bleed easily.  Psychiatric/Behavioral: Negative for dysphoric mood. The patient is not nervous/anxious.    Past Medical History: He  has a past medical history of Abnormal laboratory test (02/12/2014), Anxiety, Bipolar disorder (HCC), Depression, substance abuse (02/12/2014), and Psychosis (HCC).  Past Surgical History: He  has a past surgical history that includes Tonsillectomy and adenoidectomy.  Family History: His family history includes Anxiety disorder in his mother; Bipolar disorder in his maternal aunt; Depression in his mother.  Social History: He  reports that he has been smoking cigarettes.  He has a 0.25 pack-year smoking history. He has never used smokeless tobacco. He reports that he drinks about 0.6 - 1.2 oz of alcohol per week. He reports that he has current or past drug history. Drug: Marijuana.  Medications: Allergies as of 11/09/2017   No Known Allergies     Medication List        Accurate as of 11/09/17 10:40 AM. Always use your most recent med list.            FLUoxetine 40 MG capsule Commonly known as:  PROZAC Take 1 capsule (40 mg total) by mouth daily.   lithium 600 MG capsule Take 1 capsule (600 mg total) by mouth at bedtime.   propranolol ER 60 MG 24 hr capsule Commonly known as:  INDERAL LA Take 1 capsule (60 mg total) by mouth daily.

## 2017-11-13 DIAGNOSIS — F319 Bipolar disorder, unspecified: Secondary | ICD-10-CM | POA: Diagnosis not present

## 2017-11-13 DIAGNOSIS — F422 Mixed obsessional thoughts and acts: Secondary | ICD-10-CM | POA: Diagnosis not present

## 2017-11-21 ENCOUNTER — Encounter (HOSPITAL_COMMUNITY): Payer: Self-pay | Admitting: Psychiatry

## 2017-11-27 DIAGNOSIS — G4733 Obstructive sleep apnea (adult) (pediatric): Secondary | ICD-10-CM | POA: Diagnosis not present

## 2017-11-30 ENCOUNTER — Ambulatory Visit (HOSPITAL_COMMUNITY): Payer: Self-pay | Admitting: Psychiatry

## 2017-12-07 ENCOUNTER — Inpatient Hospital Stay (HOSPITAL_COMMUNITY)
Admission: RE | Admit: 2017-12-07 | Discharge: 2017-12-15 | DRG: 885 | Disposition: A | Discharge status: Home / Self Care | Payer: BLUE CROSS/BLUE SHIELD | Attending: Psychiatry | Admitting: Psychiatry

## 2017-12-07 DIAGNOSIS — F603 Borderline personality disorder: Secondary | ICD-10-CM | POA: Diagnosis present

## 2017-12-07 DIAGNOSIS — Z91410 Personal history of adult physical and sexual abuse: Secondary | ICD-10-CM | POA: Diagnosis not present

## 2017-12-07 DIAGNOSIS — G4700 Insomnia, unspecified: Secondary | ICD-10-CM | POA: Diagnosis present

## 2017-12-07 DIAGNOSIS — R079 Chest pain, unspecified: Secondary | ICD-10-CM | POA: Diagnosis not present

## 2017-12-07 DIAGNOSIS — E669 Obesity, unspecified: Secondary | ICD-10-CM | POA: Diagnosis present

## 2017-12-07 DIAGNOSIS — K219 Gastro-esophageal reflux disease without esophagitis: Secondary | ICD-10-CM | POA: Diagnosis not present

## 2017-12-07 DIAGNOSIS — F419 Anxiety disorder, unspecified: Secondary | ICD-10-CM | POA: Diagnosis not present

## 2017-12-07 DIAGNOSIS — Z6841 Body Mass Index (BMI) 40.0 and over, adult: Secondary | ICD-10-CM | POA: Diagnosis not present

## 2017-12-07 DIAGNOSIS — F17210 Nicotine dependence, cigarettes, uncomplicated: Secondary | ICD-10-CM | POA: Diagnosis not present

## 2017-12-07 DIAGNOSIS — R45851 Suicidal ideations: Secondary | ICD-10-CM | POA: Diagnosis present

## 2017-12-07 DIAGNOSIS — Z818 Family history of other mental and behavioral disorders: Secondary | ICD-10-CM

## 2017-12-07 DIAGNOSIS — F314 Bipolar disorder, current episode depressed, severe, without psychotic features: Secondary | ICD-10-CM | POA: Diagnosis not present

## 2017-12-07 DIAGNOSIS — R52 Pain, unspecified: Secondary | ICD-10-CM

## 2017-12-07 DIAGNOSIS — Z915 Personal history of self-harm: Secondary | ICD-10-CM | POA: Diagnosis not present

## 2017-12-07 DIAGNOSIS — F1290 Cannabis use, unspecified, uncomplicated: Secondary | ICD-10-CM | POA: Diagnosis not present

## 2017-12-07 DIAGNOSIS — F4310 Post-traumatic stress disorder, unspecified: Secondary | ICD-10-CM | POA: Diagnosis not present

## 2017-12-07 DIAGNOSIS — K5900 Constipation, unspecified: Secondary | ICD-10-CM | POA: Diagnosis not present

## 2017-12-07 MED ORDER — MAGNESIUM HYDROXIDE 400 MG/5ML PO SUSP: 30.0000 mL | Freq: Every day | ORAL | Status: DC | PRN

## 2017-12-07 MED ORDER — TRAZODONE HCL 50 MG PO TABS
50.0000 mg | ORAL_TABLET | Freq: Every evening | ORAL | Status: DC | PRN
  Administered 2017-12-08 – 2017-12-14 (×6): 50 mg via ORAL
  Filled 2017-12-07 (×5): qty 1

## 2017-12-07 MED ORDER — ALUM & MAG HYDROXIDE-SIMETH 200-200-20 MG/5ML PO SUSP
30.0000 mL | ORAL | Status: DC | PRN
  Administered 2017-12-13: 30 mL via ORAL
  Filled 2017-12-07: qty 30

## 2017-12-07 MED ORDER — ACETAMINOPHEN 325 MG PO TABS
650.0000 mg | ORAL_TABLET | Freq: Four times a day (QID) | ORAL | Status: DC | PRN
  Administered 2017-12-12 – 2017-12-14 (×2): 650 mg via ORAL
  Filled 2017-12-07 (×2): qty 2

## 2017-12-07 MED ORDER — HYDROXYZINE HCL 25 MG PO TABS
25.0000 mg | ORAL_TABLET | Freq: Three times a day (TID) | ORAL | Status: DC | PRN
  Administered 2017-12-08 – 2017-12-14 (×12): 25 mg via ORAL
  Filled 2017-12-07 (×11): qty 1
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
## 2017-12-08 ENCOUNTER — Encounter (HOSPITAL_COMMUNITY): Payer: Self-pay

## 2017-12-08 ENCOUNTER — Other Ambulatory Visit: Payer: Self-pay

## 2017-12-08 DIAGNOSIS — Z818 Family history of other mental and behavioral disorders: Secondary | ICD-10-CM

## 2017-12-08 DIAGNOSIS — F603 Borderline personality disorder: Secondary | ICD-10-CM

## 2017-12-08 DIAGNOSIS — F419 Anxiety disorder, unspecified: Secondary | ICD-10-CM

## 2017-12-08 DIAGNOSIS — R45851 Suicidal ideations: Secondary | ICD-10-CM

## 2017-12-08 DIAGNOSIS — F17210 Nicotine dependence, cigarettes, uncomplicated: Secondary | ICD-10-CM

## 2017-12-08 DIAGNOSIS — Z91410 Personal history of adult physical and sexual abuse: Secondary | ICD-10-CM

## 2017-12-08 DIAGNOSIS — G4700 Insomnia, unspecified: Secondary | ICD-10-CM

## 2017-12-08 LAB — COMPREHENSIVE METABOLIC PANEL
ALBUMIN: 4.5 g/dL (ref 3.5–5.0)
ALT: 85 U/L — AB (ref 0–44)
AST: 44 U/L — AB (ref 15–41)
Alkaline Phosphatase: 73 U/L (ref 38–126)
Anion gap: 11 (ref 5–15)
BUN: 11 mg/dL (ref 6–20)
CHLORIDE: 106 mmol/L (ref 98–111)
CO2: 26 mmol/L (ref 22–32)
CREATININE: 0.94 mg/dL (ref 0.61–1.24)
Calcium: 9.7 mg/dL (ref 8.9–10.3)
GFR calc Af Amer: >60 mL/min (ref >60)
GLUCOSE: 90 mg/dL (ref 70–99)
POTASSIUM: 3.8 mmol/L (ref 3.5–5.1)
Sodium: 143 mmol/L (ref 135–145)
Total Bilirubin: 0.7 mg/dL (ref 0.3–1.2)
Total Protein: 7.4 g/dL (ref 6.5–8.1)

## 2017-12-08 LAB — CBC
HCT: 49.6 % (ref 39.0–52.0)
Hemoglobin: 17.6 g/dL — ABNORMAL HIGH (ref 13.0–17.0)
MCH: 32.3 pg (ref 26.0–34.0)
MCHC: 35.5 g/dL (ref 30.0–36.0)
MCV: 91 fL (ref 78.0–100.0)
PLATELETS: 301 10*3/uL (ref 150–400)
RBC: 5.45 MIL/uL (ref 4.22–5.81)
RDW: 12.7 % (ref 11.5–15.5)
WBC: 9.7 10*3/uL (ref 4.0–10.5)

## 2017-12-08 LAB — RAPID URINE DRUG SCREEN, HOSP PERFORMED
Amphetamines: NOT DETECTED
BARBITURATES: NOT DETECTED
Benzodiazepines: NOT DETECTED
Cocaine: NOT DETECTED
Opiates: NOT DETECTED
TETRAHYDROCANNABINOL: NOT DETECTED

## 2017-12-08 MED ORDER — VENLAFAXINE HCL ER 37.5 MG PO CP24
37.5000 mg | ORAL_CAPSULE | Freq: Every day | ORAL | Status: DC
  Administered 2017-12-09 – 2017-12-11 (×3): 37.5 mg via ORAL
  Filled 2017-12-08 (×5): qty 1

## 2017-12-08 MED ORDER — PANTOPRAZOLE SODIUM 20 MG PO TBEC
20.0000 mg | DELAYED_RELEASE_TABLET | Freq: Every day | ORAL | Status: DC
  Administered 2017-12-08 – 2017-12-15 (×8): 20 mg via ORAL
  Filled 2017-12-08 (×11): qty 1

## 2017-12-08 MED ORDER — ARIPIPRAZOLE 5 MG PO TABS
5.0000 mg | ORAL_TABLET | Freq: Every day | ORAL | Status: DC
  Administered 2017-12-08 – 2017-12-14 (×7): 5 mg via ORAL
  Filled 2017-12-08 (×10): qty 1

## 2017-12-08 MED ORDER — BUPROPION HCL ER (XL) 150 MG PO TB24: 150.0000 mg | ORAL_TABLET | Freq: Every day | ORAL | Status: DC

## 2017-12-08 NOTE — Progress Notes (Signed)
Recreation Therapy Notes  Date: 8.23.19 Time: 0930 Location: 300 Hall Dayroom  Group Topic: Stress Management  Goal Area(s) Addresses:  Patient will verbalize importance of using healthy stress management.  Patient will identify positive emotions associated with healthy stress management.   Behavioral Response: Engaged  Intervention: Stress Management  Activity :  Progressive Muscle Relaxation.  LRT introduced the stress management technique of progressive muscle relaxation.  LRT read a script on tensing then relaxing each muscle group individually.  Patients were to follow along as LRT read the script to engage in the activity.  Education:  Stress Management, Discharge Planning.   Education Outcome: Acknowledges edcuation/In group clarification offered/Needs additional education  Clinical Observations/Feedback: Patient attended and participated in group.    Caroll RancherMarjette Tattiana Fakhouri, LRT/CTRS         Caroll RancherLindsay, Dionne Knoop A 12/08/2017 12:03 PM

## 2017-12-08 NOTE — Tx Team (Signed)
Initial Treatment Plan 12/08/2017 1:40 AM Andrew Noble [id number]    PATIENT STRESSORS: Medication change or noncompliance Substance abuse   PATIENT STRENGTHS: General fund of knowledge Motivation for treatment/growth Supportive family/friends   PATIENT IDENTIFIED PROBLEMS: "stop feeling suicidal"  "medication adjusted"                   DISCHARGE CRITERIA:  Improved stabilization in mood, thinking, and/or behavior Motivation to continue treatment in a less acute level of care Reduction of life-threatening or endangering symptoms to within safe limits Verbal commitment to aftercare and medication compliance  PRELIMINARY DISCHARGE PLAN: Attend aftercare/continuing care group Outpatient therapy Return to previous living arrangement Return to previous work or school arrangements  PATIENT/FAMILY INVOLVEMENT: This treatment plan has been presented to and reviewed with the patient, Andrew Noble, and/or family member, .  The patient and family have been given the opportunity to ask questions and make suggestions.  Andrena Mewsuttall, Davien Malone J, RN 12/08/2017, 1:40 AM

## 2017-12-08 NOTE — [facility] Suicide Risk Assessment (Signed)
[facility] INPATIENT:  Family/Significant Other Suicide Prevention Education  Suicide Prevention Education:  Education Completed: Aldean BakerLaura Levin (pt's mother) [phone] has been identified by the patient as the family member/significant other with whom the patient will be residing, and identified as the person(s) who will aid the patient in the event of a mental health crisis (suicidal ideations/suicide attempt).  With written consent from the patient, the family member/significant other has been provided the following suicide prevention education, prior to the and/or following the discharge of the patient.  The suicide prevention education provided includes the following:  Suicide risk factors  Suicide prevention and interventions  National Suicide Hotline telephone number  [facility] assessment telephone number  [facility] Emergency Assistance 911  [facility] and/or Residential Mobile Crisis Unit telephone number  Request made of family/significant other to:  Remove weapons (e.g., guns, rifles, knives), all items previously/currently identified as safety concern.    Remove drugs/medications (over-the-counter, prescriptions, illicit drugs), all items previously/currently identified as a safety concern.  The family member/significant other verbalizes understanding of the suicide prevention education information provided.  The family member/significant other agrees to remove the items of safety concern listed above.  Rona RavensHeather S Maurizio Geno LCSW  12/08/2017, 12:57 PM

## 2017-12-08 NOTE — Progress Notes (Signed)
Pt is a 22 year old male admitted with depression and suicidal ideation  With a plan to overdose on his medications    Depression has increased over the past week    He said he has no social life that he just works and sleeps   He said he has been drinking ETOH and not taking his medications as presecribed    He is cooperative during the assessment   He reports poor concentration and poor sleep    Pt was oriented to the unit and offered nourishment   Verbal support given    Medications administered and efffectiveness monitored    Q 15 min checks   Pt is safe at this time

## 2017-12-08 NOTE — Tx Team (Signed)
Interdisciplinary Treatment and Diagnostic Plan Update  12/08/2017 Time of Session: 0830AM Dwayne Bulkley MRN: [id number]  Principal Diagnosis: Bipolar Disorder  Secondary Diagnoses: Active Problems:   Bipolar 1 disorder, depressed, severe (HCC)   Current Medications:  Current Facility-Administered Medications  Medication Dose Route Frequency Provider Last Rate Last Dose  . acetaminophen (TYLENOL) tablet 650 mg  650 mg Oral Q6H PRN Nira Conn A, NP      . alum & mag hydroxide-simeth (MAALOX/MYLANTA) 200-200-20 MG/5ML suspension 30 mL  30 mL Oral Q4H PRN Nira Conn A, NP      . hydrOXYzine (ATARAX/VISTARIL) tablet 25 mg  25 mg Oral TID PRN Nira Conn A, NP   25 mg at 12/08/17 0012  . magnesium hydroxide (MILK OF MAGNESIA) suspension 30 mL  30 mL Oral Daily PRN Nira Conn A, NP      . traZODone (DESYREL) tablet 50 mg  50 mg Oral QHS PRN Jackelyn Poling, NP   50 mg at 12/08/17 0012   PTA Medications: Medications Prior to Admission  Medication Sig Dispense Refill Last Dose  . FLUoxetine (PROZAC) 40 MG capsule Take 1 capsule (40 mg total) by mouth daily. 90 capsule 0 Taking  . lithium carbonate 600 MG capsule Take 1 capsule (600 mg total) by mouth at bedtime. 30 capsule 0 Taking  . propranolol ER (INDERAL LA) 60 MG 24 hr capsule Take 1 capsule (60 mg total) by mouth daily. 90 capsule 0 Taking    Patient Stressors: Medication change or noncompliance Substance abuse  Patient Strengths: General fund of knowledge Motivation for treatment/growth Supportive family/friends  Treatment Modalities: Medication Management, Group therapy, Case management,  1 to 1 session with clinician, Psychoeducation, Recreational therapy.   Physician Treatment Plan for Primary Diagnosis: Bipolar Disorder Long Term Goal(s): Improvement in symptoms so as ready for discharge Improvement in symptoms so as ready for discharge   Short Term Goals: Ability to verbalize feelings will improve Ability to  disclose and discuss suicidal ideas Ability to identify and develop effective coping behaviors will improve Compliance with prescribed medications will improve  Medication Management: Evaluate patient's response, side effects, and tolerance of medication regimen.  Therapeutic Interventions: 1 to 1 sessions, Unit Group sessions and Medication administration.  Evaluation of Outcomes: Progressing  Physician Treatment Plan for Secondary Diagnosis: Active Problems:   Bipolar 1 disorder, depressed, severe (HCC)  Long Term Goal(s): Improvement in symptoms so as ready for discharge Improvement in symptoms so as ready for discharge   Short Term Goals: Ability to verbalize feelings will improve Ability to disclose and discuss suicidal ideas Ability to identify and develop effective coping behaviors will improve Compliance with prescribed medications will improve     Medication Management: Evaluate patient's response, side effects, and tolerance of medication regimen.  Therapeutic Interventions: 1 to 1 sessions, Unit Group sessions and Medication administration.  Evaluation of Outcomes: Progressing   RN Treatment Plan for Primary Diagnosis: Bipolar Disorder Long Term Goal(s): Knowledge of disease and therapeutic regimen to maintain health will improve  Short Term Goals: Ability to remain free from injury will improve, Ability to disclose and discuss suicidal ideas and Ability to identify and develop effective coping behaviors will improve  Medication Management: RN will administer medications as ordered by provider, will assess and evaluate patient's response and provide education to patient for prescribed medication. RN will report any adverse and/or side effects to prescribing provider.  Therapeutic Interventions: 1 on 1 counseling sessions, Psychoeducation, Medication administration, Evaluate responses to treatment, Monitor vital  signs and CBGs as ordered, Perform/monitor CIWA, COWS, AIMS  and Fall Risk screenings as ordered, Perform wound care treatments as ordered.  Evaluation of Outcomes: Progressing   LCSW Treatment Plan for Primary Diagnosis: Bipolar Disorder Long Term Goal(s): Safe transition to appropriate next level of care at discharge, Engage patient in therapeutic group addressing interpersonal concerns.  Short Term Goals: Engage patient in aftercare planning with referrals and resources, Facilitate patient progression through stages of change regarding substance use diagnoses and concerns and Identify triggers associated with mental health/substance abuse issues  Therapeutic Interventions: Assess for all discharge needs, 1 to 1 time with Social worker, Explore available resources and support systems, Assess for adequacy in community support network, Educate family and significant other(s) on suicide prevention, Complete Psychosocial Assessment, Interpersonal group therapy.  Evaluation of Outcomes: Progressing   Progress in Treatment: Attending groups: Yes. Participating in groups: Yes. Taking medication as prescribed: Yes. Toleration medication: Yes. Family/Significant other contact made: CSW will contact pt's mother for SPE with his consent.  Patient understands diagnosis: Yes. Discussing patient identified problems/goals with staff: Yes. Medical problems stabilized or resolved: Yes. Denies suicidal/homicidal ideation: Yes. Issues/concerns per patient self-inventory: No. Other: n/a   New problem(s) identified: No, Describe:  n/a  New Short Term/Long Term Goal(s):  medication management for mood stabilization; elimination of SI thoughts; development of comprehensive mental wellness/sobriety plan.   Patient Goals:  "To stop feeling so down and suicidal. To learn better coping skills."   Discharge Plan or Barriers: CSW assessing--pt plans to return home and would like to resume services with his current providers. Pt sees [facility] R[facility]ksir [facility] for  medication management and [facility] [place] for therapy. [facility] pamphlet, Mobile Crisis information provided to patient for additional community support and resources.   Reason for Continuation of Hospitalization: Anxiety Depression Medication stabilization Suicidal ideation  Estimated Length of Stay: Tuesday, 12/12/17  Attendees: Patient: 12/08/2017 11:29 AM  Physician: Dr. Altamese Carolinaainville MD; Dr. Jama Flavorsobos MD 12/08/2017 11:29 AM  Nursing: Alexia FreestonePatty RN; Marchelle FolksAmanda RN; Lorin GlassDanika RN 12/08/2017 11:29 AM  RN Care Manager:x 12/08/2017 11:29 AM  Social Worker: Corrie MckusickHeather Aury Scollard LCSW 12/08/2017 11:29 AM  Recreational Therapist: Juliann ParesX 12/08/2017 11:29 AM  Other: Renold DonLashaunda Thomas NP 12/08/2017 11:29 AM  Other:  12/08/2017 11:29 AM  Other: 12/08/2017 11:29 AM    Scribe for Treatment Team: Rona RavensHeather S Tuwanda Vokes, LCSW 12/08/2017 11:29 AM
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
## 2017-12-08 NOTE — Progress Notes (Signed)
D: Patient alert and oriented. Affect/mood: anxious, pleasant. Denies SI, HI, AVH at this time, though endorses passive SI thoughts that occur some of the time. Patient remains adamant that he can remain safe and free from self harm while here on the unit. Denies pain. Goal: "to talk to the Dr and explain what is going on". Patient endorses feelings of depression, hopelessness, and anxiety "5" (0-10). Endorses "fair" sleep and appetite, and "poor" concentration. Patient approached this writer to ask if the unit has incontinence briefs, as he has difficulty waking in the night and experiences nocturnal enuresis. Incontinence briefs provided. Patient also made aware that this will be reported to night shift, as it may be beneficial that he is awakened at night to avoid occurrence of bedwetting. Medications administered per providers order. Patient states that he has taken these medication in the past, though stopped at some point. States that he has cycles of deep depression followed by high energy and doesn't want to go through these cycles as they interfere with his ability to have a happy life. Body odor noted during interaction. Denies that he has had issues with completing ADLs when asked. Patient encouraged to notify staff if he needs towels, washcloths or toiletries. PRN vistaril given for anxiety, will reassess for relief.  A: Scheduled medications administered to patient per MD order. Support and encouragement provided. Routine safety checks conducted every 15 minutes. Patient informed to notify staff with problems or concerns.  R: No adverse drug reactions noted. Patient contracts for safety at this time. Patient compliant with medications and treatment plan. Patient receptive, and cooperative, though anxious. Patient interacts well with others on the unit. Patient remains safe at this time. Will continue to monitor.

## 2017-12-08 NOTE — Progress Notes (Signed)
Psychoeducational Group Note  Date:  12/08/2017 Time:  0126  Group Topic/Focus:  Wrap-Up Group:   The focus of this group is to help patients review their daily goal of treatment and discuss progress on daily workbooks.  Participation Level: Did Not Attend  Participation Quality:  Not Applicable  Affect:  Not Applicable  Cognitive:  Not Applicable  Insight:  Not Applicable  Engagement in Group: Not Applicable  Additional Comments:  The patient did not attend group since he was not admitted to the hallway until later in the evening.   Nikiah Goin S 12/08/2017, 1:26 AM

## 2017-12-08 NOTE — Progress Notes (Signed)
At the beginning of the shift, pt was sitting in the dayroom to himself at the back of the room watching TV.  At that time, he reported that he was fine and that he had a good day.  Writer discussed his issue with nocturia and also his sleep apnea.  His CPAP was brought in today and he was reminded to come to the NS to get it when he goes to bed.  We also made a plan to wake him every couple of hours through the night to go to bed to hopefully avoid wetting the bed.  Pt is agreeable to this plan.  Pt denies SI/HI/AVH.  He is not detoxing from anything.  He voiced no other needs or concerns.  Then about 45 minutes later, he came to the NS stating that he was feeling very anxious and felt it was a panic attack.  Writer talked with pt and then gave him a prn Vistaril.  He was encouraged to lie down for a little while and then come back up for his CPAP, which he did.  The prn was effective.  Support and encouragement offered.  Discharge plans are in process.  Safety maintained with q15 minute checks.
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
## 2017-12-08 NOTE — H&P (Signed)
Behavioral Health Medical Screening Exam  Andrew LuisRobert Noble is an 22 y.o. male.  Total Time spent with patient: 20 minutes  Psychiatric Specialty Exam: Physical Exam  Constitutional: He is oriented to person, place, and time. He appears well-developed and well-nourished. No distress.  HENT:  Head: Normocephalic and atraumatic.  Right Ear: External ear normal.  Left Ear: External ear normal.  Eyes: Conjunctivae are normal. Right eye exhibits no discharge. Left eye exhibits no discharge. No scleral icterus.  Respiratory: Effort normal. No respiratory distress.  Musculoskeletal: Normal range of motion.  Neurological: He is alert and oriented to person, place, and time.  Skin: Skin is warm and dry. He is not diaphoretic.  Psychiatric: His speech is normal. His mood appears anxious. Thought content is not paranoid and not delusional. Cognition and memory are normal. He exhibits a depressed mood. He expresses suicidal ideation. He expresses no homicidal ideation. He expresses suicidal plans.    ROS  Blood pressure (!) 145/96, pulse 92, temperature 98.6 F (37 C), resp. rate 16, SpO2 98 %.There is no height or weight on file to calculate BMI.  General Appearance: Casual and Well Groomed  Eye Contact:  Fair  Speech:  Clear and Coherent and Normal Rate  Volume:  Normal  Mood:  Depressed, Dysphoric, Hopeless and Worthless  Affect:  Congruent and Depressed  Thought Process:  Coherent, Goal Directed and Descriptions of Associations: Intact  Orientation:  Full (Time, Place, and Person)  Thought Content:  Logical and Hallucinations: None  Suicidal Thoughts:  Yes.  with intent/plan  Homicidal Thoughts:  No  Memory:  Immediate;   Fair Recent;   Fair  Judgement:  Impaired  Insight:  Lacking  Psychomotor Activity:  Decreased  Concentration: Concentration: Fair and Attention Span: Fair  Recall:  Good  Fund of Knowledge:Good  Language: Good  Akathisia:  No  Handed:  Right  AIMS (if indicated):      Assets:  Communication Skills Desire for Improvement Financial Resources/Insurance Housing Physical Health  Sleep:       Musculoskeletal: Strength & Muscle Tone: within normal limits Gait & Station: normal   Blood pressure (!) 145/96, pulse 92, temperature 98.6 F (37 C), resp. rate 16, SpO2 98 %.  Recommendations:  Based on my evaluation the patient does not appear to have an emergency medical condition.  Jackelyn PolingJason A Trinia Georgi, NP 12/08/2017, 1:07 AM

## 2017-12-08 NOTE — BHH Suicide Risk Assessment (Addendum)
[facility] Admission Suicide Risk Assessment   Nursing information obtained from:  Patient Demographic factors:  Male, Living alone, Caucasian, Adolescent or young adult Current Mental Status:  Suicidal ideation indicated by patient, Suicide plan, Self-harm thoughts, Intention to act on suicide plan Loss Factors:  NA Historical Factors:  Prior suicide attempts Risk Reduction Factors:  Employed, Positive social support  Total Time spent with patient: 45 minutes Principal Problem: Bipolar 1 disorder, depressed, severe ([facility]) Diagnosis:   Patient Active Problem List   Diagnosis Date Noted  . Bipolar 1 disorder, depressed, severe ([facility]) [F31.4] 10/05/2017  . Borderline personality disorder ([facility]) [F60.3]   . Elevated liver enzymes [R74.8] 09/24/2015  . Obesity [E66.9] 09/24/2015  . Generalized anxiety disorder [F41.1] 03/05/2015  . Bipolar disorder with depression ([facility]) [F31.9]   . Bipolar 1 disorder ([facility]) [F31.9] 10/11/2013  . Mood disorder ([facility]) [F39] 05/20/2011  . History of eating disorder [Z86.59] 05/20/2011   Subjective Data:   Continued Clinical Symptoms:  Alcohol Use Disorder Identification Test Final Score (AUDIT): 7 The "Alcohol Use Disorders Identification Test", Guidelines for Use in Primary Care, Second Edition.  World Science writer [facility]). Score between 0-7:  no or low risk or alcohol related problems. Score between 8-15:  moderate risk of alcohol related problems. Score between 16-19:  high risk of alcohol related problems. Score 20 or above:  warrants further diagnostic evaluation for alcohol dependence and treatment.   CLINICAL FACTORS:  21, single, no children, lives alone,  HS graduate,employed at [employer] . Presented to hospital voluntarily due to worsening depression and intermittent suicidal ideations, with thoughts of overdosing. Reports erratic sleep, with disrupted sleep wake cycle, anhedonia, low energy level, social isolation. Denies psychotic  symptoms. Reports financial constraints have been a contributing factor . Reports he has not been taking psychiatric medications regularly , states he stopped lithium due to tremors, and recently stopped Prozac because he did not feel it was helping.  Patient has history of depression and has had several psychiatric admissions starting as a teenager.He has admitted to [facility] in June 2019 for depression, suicidal ideations. At the time was diagnosed with Bipolar Disorder- was discharged on lithium, prozac.  Does endorse brief episodes of increased energy and not sleeping for 1-2 days, increased spending Denies history of suicide attempts , denies history of self cutting. Reports he has been on several psychiatric medications in the past, remembers Abilify , Effexor XR as helpful.  Denies drug or alcohol abuse   Dx- Bipolar Disorder II depressed   Plan - inpatient admission, we discussed options, prefers not to restart  Li as it was not well tolerated, and does not feel Prozac was helping . Reports Abilify and Effexor XR were helpful and does not remember having had side effects Start Abilify 5 mgrs QDAY  Start Effexor XR 37.5 mgrs QDAY     Musculoskeletal: Strength & Muscle Tone: within normal limits Gait & Station: normal Patient leans: N/A  Psychiatric Specialty Exam: Physical Exam  ROS no fever, no chills, no chest pain, no shortness of breath, no vomiting , no diarrhea, no rash  Blood pressure 118/71, pulse 89, temperature 97.8 F (36.6 C), temperature source Oral, resp. rate 18, height 5\' 7"  (1.702 m), weight 128.4 kg, SpO2 98 %.Body mass index is 44.32 kg/m.  General Appearance: Fairly Groomed  Eye Contact:  Good  Speech:  Normal Rate  Volume:  Normal  Mood:  depressed   Affect:  Constricted  Thought Process:  Linear and Descriptions  of Associations: Intact  Orientation:  Full (Time, Place, and Person)  Thought Content:  no hallucinations, no delusions , not internally  preoccupied   Suicidal Thoughts:  No denies suicidal or self injurious ideations, contracts for safety on unit, denies homicidal or violent ideations  Homicidal Thoughts:  No  Memory:  recent and remote grossly intact   Judgement:  Fair  Insight:  Fair  Psychomotor Activity:  Normal  Concentration:  Concentration: Good and Attention Span: Good  Recall:  Good  Fund of Knowledge:  Good  Language:  Good  Akathisia:  Negative  Handed:  Right  AIMS (if indicated):     Assets:  Desire for Improvement Resilience  ADL's:  Intact  Cognition:  WNL  Sleep:  Number of Hours: 4.25      COGNITIVE FEATURES THAT CONTRIBUTE TO RISK:  Closed-mindedness and Loss of executive function    SUICIDE RISK:   Moderate:  Frequent suicidal ideation with limited intensity, and duration, some specificity in terms of plans, no associated intent, good self-control, limited dysphoria/symptomatology, some risk factors present, and identifiable protective factors, including available and accessible social support.  PLAN OF CARE: Patient will be admitted to inpatient psychiatric unit for stabilization and safety. Will provide and encourage milieu participation. Provide medication management and maked adjustments as needed.  Will follow daily.    I certify that inpatient services furnished can reasonably be expected to improve the patient's condition.   Craige CottaFernando A Rylan Bernard, MD 12/08/2017, 1:10 PM

## 2017-12-09 DIAGNOSIS — F314 Bipolar disorder, current episode depressed, severe, without psychotic features: Principal | ICD-10-CM

## 2017-12-09 LAB — HEMOGLOBIN A1C
HEMOGLOBIN A1C: 4.7 % — AB (ref 4.8–5.6)
MEAN PLASMA GLUCOSE: 88.19 mg/dL

## 2017-12-09 LAB — LIPID PANEL
CHOLESTEROL: 204 mg/dL — AB (ref 0–200)
HDL: 33 mg/dL — ABNORMAL LOW (ref >40)
LDL Cholesterol: 137 mg/dL — ABNORMAL HIGH (ref 0–99)
Total CHOL/HDL Ratio: 6.2 RATIO
Triglycerides: 168 mg/dL — ABNORMAL HIGH (ref <150)
VLDL: 34 mg/dL (ref 0–40)

## 2017-12-09 NOTE — Progress Notes (Signed)
Patient ID: Andrew Noble, male   DOB: Sep 09, 1995, 22 y.o.   MRN: [id number]    D: Pt has been very flat and depressed on the unit today. Pt started off the morning on the 300 hall, but was moved to the 400 hall as he does not have any substance abuse issues. Pt reported that his depression was a 5, his hopelessness was a 5, and his anxiety was a 5. Pt reported that his goal for today was to work on coping skills. Pt attended all groups and engaged in treatment. Pt reported that he was positive SI on his patient self inventory sheet, but reported that he could contract for safety.  Pt reported being negative HI, no AH/VH noted. A: 15 min checks continued for patient safety. R: Pt safety maintained.

## 2017-12-09 NOTE — [facility] Group Notes (Signed)
LCSW Group Therapy Note  12/09/2017   10:00-11:00am   Type of Therapy and Topic:  Group Therapy: Anger Cues and Responses  Participation Level:  Active   Description of Group:   In this group, patients learned how to recognize the physical, cognitive, emotional, and behavioral responses they have to anger-provoking situations.  They identified a recent time they became angry and how they reacted.  They analyzed how their reaction was possibly beneficial and how it was possibly unhelpful.  The group discussed a variety of healthier coping skills that could help with such a situation in the future.  Deep breathing was practiced briefly.  Therapeutic Goals: 1. Patients will remember their last incident of anger and how they felt emotionally and physically, what their thoughts were at the time, and how they behaved. 2. Patients will identify how their behavior at that time worked for them, as well as how it worked against them. 3. Patients will explore possible new behaviors to use in future anger situations. 4. Patients will learn that anger itself is normal and cannot be eliminated, and that healthier reactions can assist with resolving conflict rather than worsening situations.  Summary of Patient Progress:  The patient shared that his most recent time of anger was yesterday with himself and said he wanted to hit something or do some push-ups, but instead ended up having a panic attack.  He was one of the more interactive members of the group and was very engaged and thoughtful, sharing insights with the group as they occurred to him.  Therapeutic Modalities:   Cognitive Behavioral Therapy  Lynnell ChadMareida J Grossman-Orr

## 2017-12-09 NOTE — Progress Notes (Signed)
[facility] MD Progress Note  12/09/2017 4:49 PM Andrew Noble  MRN:  [id number]   Subjective: I took something for anxiety and I feel like it helped. I think I had a panic attack yesterday. I laid down and went to sleep and woke up feeling a little better. Im worried about not having a job when I go back to sleep.   Objective: Patient is seen and examined.  Patient is a 22 year old male with the past psychiatric history significant for probable borderline personality disorder, bipolar disorder unspecified, posttraumatic stress disorder and chronic suicidal ideation.  He seen in follow-up.  He stated doesn't feel better mood, he has much more anxiety. He remains tangential and ruminating about his anxiety and previous psychiatric history, and relapses. Today he rates his depression 5-6/10 and anxiety 9/10 with 10 being the worse. He continues to have mild but intermittent suicidal ideation, but none since early this a.m.  He denied any side effects to his current medications. We began to discuss his discharge planning.   Principal Problem: Bipolar 1 disorder, depressed, severe ([place]) Diagnosis:   Patient Active Problem List   Diagnosis Date Noted  . Bipolar 1 disorder, depressed, severe ([place]) [F31.4] 10/05/2017  . Borderline personality disorder ([place]) [F60.3]   . Elevated liver enzymes [R74.8] 09/24/2015  . Obesity [E66.9] 09/24/2015  . Generalized anxiety disorder [F41.1] 03/05/2015  . Bipolar disorder with depression ([place]) [F31.9]   . Bipolar 1 disorder ([place]) [F31.9] 10/11/2013  . Mood disorder ([place]) [F39] 05/20/2011  . History of eating disorder [Z86.59] 05/20/2011   Total Time spent with patient: 20 minutes  Past Psychiatric History: See admission H&P  Past Medical History:  Past Medical History:  Diagnosis Date  . Abnormal laboratory test 02/12/2014  . Anxiety   . Bipolar disorder ([place])   . Depression   . Hx of substance abuse 02/12/2014  . Psychosis [facility])     Past Surgical History:   Procedure Laterality Date  . TONSILLECTOMY AND ADENOIDECTOMY     Family History:  Family History  Problem Relation Age of Onset  . Depression Mother   . Anxiety disorder Mother   . Bipolar disorder Maternal Aunt    Family Psychiatric  History: See admission H&P Social History:  Social History   Substance and Sexual Activity  Alcohol Use Yes  . Alcohol/week: 1.0 - 2.0 standard drinks  . Types: 1 - 2 Cans of beer per week   Comment: rare     Social History   Substance and Sexual Activity  Drug Use Yes  . Types: Marijuana   Comment: no longer using    Social History   Socioeconomic History  . Marital status: Single    Spouse name: Not on file  . Number of children: Not on file  . Years of education: Not on file  . Highest education level: Not on file  Occupational History  . Not on file  Social Needs  . Financial resource strain: Not on file  . Food insecurity:    Worry: Not on file    Inability: Not on file  . Transportation needs:    Medical: Not on file    Non-medical: Not on file  Tobacco Use  . Smoking status: Light Tobacco Smoker    Packs/day: 0.25    Years: 1.00    Pack years: 0.25    Types: Cigarettes  . Smokeless tobacco: Never Used  . Tobacco comment: down to a few cigarettes daily  Substance and Sexual Activity  .  Alcohol use: Yes    Alcohol/week: 1.0 - 2.0 standard drinks    Types: 1 - 2 Cans of beer per week    Comment: rare  . Drug use: Yes    Types: Marijuana    Comment: no longer using  . Sexual activity: Never  Lifestyle  . Physical activity:    Days per week: Not on file    Minutes per session: Not on file  . Stress: Not on file  Relationships  . Social connections:    Talks on phone: Not on file    Gets together: Not on file    Attends religious service: Not on file    Active member of club or organization: Not on file    Attends meetings of clubs or organizations: Not on file    Relationship status: Not on file  Other  Topics Concern  . Not on file  Social History Narrative  . Not on file   Additional Social History:    Pain Medications: None Prescriptions: Flouoxetine 30m Over the Counter: None History of alcohol / drug use?: Yes Longest period of sobriety (when/how long): denies Name of Substance 1: ETOH 1 - Age of First Use: 22years of age 22- Amount (size/oz): Varies 1 - Frequency: Less than once a week but more than 2x/M 1 - Duration: off and on 1 - Last Use / Amount: 08/20     Sleep: Good  Appetite:  Fair  Current Medications: Current Facility-Administered Medications  Medication Dose Route Frequency Provider Last Rate Last Dose  . acetaminophen (TYLENOL) tablet 650 mg  650 mg Oral Q6H PRN BLindon RompA, NP      . alum & mag hydroxide-simeth (MAALOX/MYLANTA) 200-200-20 MG/5ML suspension 30 mL  30 mL Oral Q4H PRN BLindon RompA, NP      . ARIPiprazole (ABILIFY) tablet 5 mg  5 mg Oral Daily Cobos, FMyer Peer MD   5 mg at 12/09/17 1116  . hydrOXYzine (ATARAX/VISTARIL) tablet 25 mg  25 mg Oral TID PRN BLindon RompA, NP   25 mg at 12/09/17 1459  . magnesium hydroxide (MILK OF MAGNESIA) suspension 30 mL  30 mL Oral Daily PRN BLindon RompA, NP      . pantoprazole (PROTONIX) EC tablet 20 mg  20 mg Oral Daily Cobos, FMyer Peer MD   20 mg at 12/09/17 1117  . traZODone (DESYREL) tablet 50 mg  50 mg Oral QHS PRN BLindon RompA, NP   50 mg at 12/08/17 0012  . venlafaxine XR (EFFEXOR-XR) 24 hr capsule 37.5 mg  37.5 mg Oral Q breakfast Cobos, FMyer Peer MD   37.5 mg at 12/09/17 1117    Lab Results:  Results for orders placed or performed during the hospital encounter of 12/07/17 (from the past 48 hour(s))  CBC     Status: Abnormal   Collection Time: 12/08/17  6:33 AM  Result Value Ref Range   WBC 9.7 4.0 - 10.5 K/uL   RBC 5.45 4.22 - 5.81 MIL/uL   Hemoglobin 17.6 (H) 13.0 - 17.0 g/dL   HCT 49.6 39.0 - 52.0 %   MCV 91.0 78.0 - 100.0 fL   MCH 32.3 26.0 - 34.0 pg   MCHC 35.5 30.0 - 36.0  g/dL   RDW 12.7 11.5 - 15.5 %   Platelets 301 150 - 400 K/uL    Comment: [facility] [address], [place] [id number] Comprehensive metabolic panel  Status: Abnormal   Collection Time: 12/08/17  6:33 AM  Result Value Ref Range   Sodium 143 135 - 145 mmol/L   Potassium 3.8 3.5 - 5.1 mmol/L   Chloride 106 98 - 111 mmol/L   CO2 26 22 - 32 mmol/L   Glucose, Bld 90 70 - 99 mg/dL   BUN 11 6 - 20 mg/dL   Creatinine, Ser 0.94 0.61 - 1.24 mg/dL   Calcium 9.7 8.9 - 10.3 mg/dL   Total Protein 7.4 6.5 - 8.1 g/dL   Albumin 4.5 3.5 - 5.0 g/dL   AST 44 (H) 15 - 41 U/L   ALT 85 (H) 0 - 44 U/L   Alkaline Phosphatase 73 38 - 126 U/L   Total Bilirubin 0.7 0.3 - 1.2 mg/dL   GFR calc non Af Amer >60 >60 mL/min   GFR calc Af Amer >60 >60 mL/min    Comment: (NOTE) The eGFR has been calculated using the CKD EPI equation. This calculation has not been validated in all clinical situations. eGFR's persistently <60 mL/min signify possible Chronic Kidney Disease.    Anion gap 11 5 - 15    Comment: Performed at [facility] [place] [address] [place], [place] [address]  Urine rapid drug screen (hosp performed)not at [facility]     Status: None   Collection Time: 12/08/17  2:02 PM  Result Value Ref Range   Opiates NONE DETECTED NONE DETECTED   Cocaine NONE DETECTED NONE DETECTED   Benzodiazepines NONE DETECTED NONE DETECTED   Amphetamines NONE DETECTED NONE DETECTED   Tetrahydrocannabinol NONE DETECTED NONE DETECTED   Barbiturates NONE DETECTED NONE DETECTED    Comment: (NOTE) DRUG SCREEN FOR MEDICAL PURPOSES ONLY.  IF CONFIRMATION IS NEEDED FOR ANY PURPOSE, NOTIFY LAB WITHIN 5 DAYS. LOWEST DETECTABLE LIMITS FOR URINE DRUG SCREEN Drug Class                     Cutoff (ng/mL) Amphetamine and metabolites    1000 Barbiturate and metabolites    200 Benzodiazepine                 573 Tricyclics and metabolites     300 Opiates and metabolites         300 Cocaine and metabolites        300 THC                            50 Performed at [facility] [place] [address] [place],  [address]   Hemoglobin A1c     Status: Abnormal   Collection Time: 12/09/17  6:46 AM  Result Value Ref Range   Hgb A1c MFr Bld 4.7 (L) 4.8 - 5.6 %    Comment: (NOTE) Pre diabetes:          5.7%-6.4% Diabetes:              >6.4% Glycemic control for   <7.0% adults with diabetes    Mean Plasma Glucose 88.19 mg/dL    Comment: Performed at [facility] [address], [place],  [address]  Lipid panel     Status: Abnormal   Collection Time: 12/09/17  6:46 AM  Result Value Ref Range   Cholesterol 204 (H) 0 - 200 mg/dL   Triglycerides 168 (H) <150 mg/dL   HDL 33 (L) >40 mg/dL   Total CHOL/HDL Ratio 6.2 RATIO   VLDL 34  0 - 40 mg/dL   LDL Cholesterol 137 (H) 0 - 99 mg/dL    Comment:        Total Cholesterol/HDL:CHD Risk Coronary Heart Disease Risk Table                     Men   Women  1/2 Average Risk   3.4   3.3  Average Risk       5.0   4.4  2 X Average Risk   9.6   7.1  3 X Average Risk  23.4   11.0        Use the calculated Patient Ratio above and the CHD Risk Table to determine the patient's CHD Risk.        ATP III CLASSIFICATION (LDL):  <100     mg/dL   Optimal  100-129  mg/dL   Near or Above                    Optimal  130-159  mg/dL   Borderline  160-189  mg/dL   High  >190     mg/dL   Very High Performed at [facility] [address], [place], [place] [address]     Blood Alcohol level:  Lab Results  Component Value Date   ETH <10 09/32/3557    Metabolic Disorder Labs: Lab Results  Component Value Date   HGBA1C 4.7 (L) 12/09/2017   MPG 88.19 12/09/2017   Lab Results  Component Value Date   PROLACTIN 5.0 04/29/2014   PROLACTIN 46.1 (H) 12/17/2013   Lab Results  Component Value Date   CHOL 204 (H) 12/09/2017   TRIG 168 (H) 12/09/2017   HDL 33 (L) 12/09/2017    CHOLHDL 6.2 12/09/2017   VLDL 34 12/09/2017   LDLCALC 137 (H) 12/09/2017    Physical Findings: AIMS: Facial and Oral Movements Muscles of Facial Expression: None, normal Lips and Perioral Area: None, normal Jaw: None, normal Tongue: None, normal,Extremity Movements Upper (arms, wrists, hands, fingers): None, normal Lower (legs, knees, ankles, toes): None, normal, Trunk Movements Neck, shoulders, hips: None, normal, Overall Severity Severity of abnormal movements (highest score from questions above): None, normal Incapacitation due to abnormal movements: None, normal Patient's awareness of abnormal movements (rate only patient's report): No Awareness, Dental Status Current problems with teeth and/or dentures?: No Does patient usually wear dentures?: No  CIWA:    COWS:     Musculoskeletal: Strength & Muscle Tone: within normal limits Gait & Station: normal Patient leans: N/A  Psychiatric Specialty Exam: Physical Exam  Constitutional: He is oriented to person, place, and time. He appears well-developed and well-nourished.  HENT:  Head: Normocephalic and atraumatic.  Respiratory: Effort normal.  Neurological: He is alert and oriented to person, place, and time.    ROS   Blood pressure 118/71, pulse 89, temperature 97.8 F (36.6 C), temperature source Oral, resp. rate 18, height 5' 7"  (1.702 m), weight 128.4 kg, SpO2 98 %.Body mass index is 44.32 kg/m.  General Appearance: Casual  Eye Contact:  Fair  Speech:  Normal Rate  Volume:  Normal  Mood:  Anxious and Depressed  Affect:  Congruent  Thought Process:  Coherent, Linear and Descriptions of Associations: Intact  Orientation:  Full (Time, Place, and Person)  Thought Content:  WDL  Suicidal Thoughts:  No  Homicidal Thoughts:  No  Memory:  Immediate;   Fair Recent;   Fair Remote;   Fair  Judgement:  Intact  Insight:  Fair  Psychomotor Activity:  Normal  Concentration:  Concentration: Fair and Attention Span: Fair   Recall:  [employer] of Knowledge:  Fair  Language:  Fair  Akathisia:  Negative  Handed:  Right  AIMS (if indicated):     Assets:  Communication Skills Desire for Improvement Financial Resources/Insurance Housing Physical Health Resilience Transportation  ADL's:  Intact  Cognition:  WNL  Sleep:  Number of Hours: 5.25    Treatment Plan Summary: Daily contact with patient to assess and evaluate symptoms and progress in treatment and Medication management  1 Admit for crisis management and stabilization.  2. Medication management to reduce symptoms to baseline and improved the patient's overall level of functioning. Closely monitor the side effects, efficacy and therapeutic response of medication.  3. Treat health problem as indicated. See MAR 4. Developed treatment plan to decrease the risk of relapse upon discharge and to reduce the need for readmission.  5. Psychosocial education regarding relapse prevention in self-care.  6. Healthcare followup as needed for medical problems and called consults as indicated.  7. Increase collateral information.  8. Restart home medication where appropriate  9. Encouraged to participate and verbalize into group milieu therapy.   Nanci Pina, FNP 12/09/2017, 4:49 PM

## 2017-12-09 NOTE — Progress Notes (Signed)
Writer has observed patient up in the dayroom watching tv and interacting with peers. He reports having had a good day and has been fine with the move to [address]. Writer informed him of medications available and he requested to take vistaril and trazodone for tonight. He did ask to be awakened around 1-2 am to remind him to go to the restroom because he reports issues with wetting himself if not awakened. He attended group and participated. Support given and safety maintained with 15 min checks.

## 2017-12-09 NOTE — [facility] Group Notes (Signed)
LCSW Group Therapy Note **Late Entry**  12/09/2017   10:00--11:00am   Type of Therapy and Topic:  Group Therapy: Anger Cues and Responses  Participation Level:  Did Not Attend   Description of Group:   In this group, patients learned how to recognize the physical, cognitive, emotional, and behavioral responses they have to anger-provoking situations.  They identified a recent time they became angry and how they reacted.  They analyzed how their reaction was possibly beneficial and how it was possibly unhelpful.  The group discussed a variety of healthier coping skills that could help with such a situation in the future.  Deep breathing was practiced briefly.  Therapeutic Goals: 1. Patients will remember their last incident of anger and how they felt emotionally and physically, what their thoughts were at the time, and how they behaved. 2. Patients will identify how their behavior at that time worked for them, as well as how it worked against them. 3. Patients will explore possible new behaviors to use in future anger situations. 4. Patients will learn that anger itself is normal and cannot be eliminated, and that healthier reactions can assist with resolving conflict rather than worsening situations.  Summary of Patient Progress:    Did not attend Therapeutic Modalities:   Cognitive Behavioral Therapy  Evorn Gongonnie D Danell Verno

## 2017-12-09 NOTE — Progress Notes (Signed)
Patient verbalized in group that he had a good day and was happy with the fact that he played basketball with his peers. He also shared that he spoke with his peer about reading from the Bible and that it brought him comfort. His goal for tomorrow is to work on finding out what he can do to cope with his "mood swings".

## 2017-12-09 NOTE — [facility] Group Notes (Signed)
[facility] Group Notes:  (Nursing/MHT/Case Management/Adjunct)  Date:  12/09/2017  Time:  1:44 PM  Type of Therapy:  Psychoeducational Skills  Participation Level:  Active  Participation Quality:  Appropriate  Affect:  Appropriate  Cognitive:  Appropriate  Insight:  Appropriate  Engagement in Group:  Engaged  Modes of Intervention:  Problem-solving  Summary of Progress/Problems: Pt attended Psychoeducational group with top topic anger management.   Jacquelyne BalintForrest, Kunta Hilleary Shanta 12/09/2017, 1:44 PM

## 2017-12-10 NOTE — B[facility] Counselor (Signed)
Pt stopped CSW to discuss a handout on Borderline Personality Disorder he had been given, and stated he believes that is his diagnosis rather than Bipolar 2.  Some discussion of the symptoms for each was held with pt perseverating on never having had euphoria for at least 4 days.  CSW provided extensive diagnosis education, and ultimately encouraged him to focus on his treatment for the uncomfortable mood swings, depression, and sleep problems he is having rather than focusing on the diagnosis.  Ambrose MantleMareida Grossman-Orr, LCSW 12/10/2017, 5:39 PM

## 2017-12-10 NOTE — Progress Notes (Signed)
Patient ID: Andrew Noble, male   DOB: 26-Jan-1996, 22 y.o.   MRN: [id number]  D: Pt with blunted affect, mood depressed, pt endorses +SI, denies having a plan, verbally contracts for safety on the unit, and states will let staff know if suicidal t/thoughts become unbearable.  Pt denies AVH/HI, reports that his sleep qualioty last night was poor, he reports a good appetite, a low energy level, and a good concentration level today.  Pt rates his depression as 4 (10 being the worst), rates his hopelessness level as 4 (10 being the worst), and rates his anxiety level as 4 (10 being the worst).  Pt reports that his goal for today is "situations....staying positive in stressful", and "use learned coping skills". Pt reclusive to his room.  A: Pt took all AM meds as scheduled, came to the nurses station at 0920 with paranoia/a, stated that he thought people were laughing at him, and thought that the cameras were watching him.  Pt was medicated with Atarax 25mg  at 0924 for anxiety.  Q15 minute checks in place for safety.  R: Will continue to monitor.

## 2017-12-10 NOTE — Progress Notes (Signed)
Writer has observed patient sitting in the dayroom watching tv. Writer spoke with him 1:1 and he reports feeling very depressed today and doesn't understand why he continues to feel this way and has been hospitalized at least 6 times. He reported to Clinical research associatewriter that he shared in group that if he has to come to the hospital again he will be placed in a body bag. Writer spoke to patient at length about him feeling this way. Writer talked with him about his positive things taking place in life as far as his support system, having a job in order to pay his bills, a place to live and being able to come in and seek help and staff not judging the amount of times he comes in seeking help. Writer encouraged him to think of something that he once enjoyed doing that he no longer does. He reported that he liked to play video games and lift weights. He did verbally contract for safety and he was able to laugh about some of the writers comments and suggestions. Support given and safety maintained on unit with 15 min checks. He returned to the dayroom after our conversation,

## 2017-12-10 NOTE — Progress Notes (Signed)
[facility] MD Progress Note  12/10/2017 11:19 AM Andrew Noble  MRN:  [id number]   Subjective: Being around people that have been through what I am going though kind of helps me. It is way more people going through what Im going through. It helps me not to judge people and things that are bigger than me. It helps me to learn that their actions may be why they act that way. Ive never have such a good interaction with people.   Objective: Patient is seen and examined.  Patient is a 22 year old male with the past psychiatric history significant for probable borderline personality disorder, bipolar disorder unspecified, posttraumatic stress disorder and chronic suicidal ideation. He presents partially improved, and tangential at this time. He reports he wants to feel normal, and is open for help. He is ruminating about his failed therapy, and medication trials. He states doctors have different opinions and different diagnoses. He states he has been diagnosed with borderline and bipolar. So I came here to get a 3rd diagnosis. He stated doesn't feel better mood, he has much more anxiety. He remains tangential and ruminating about his anxiety and previous psychiatric history, and relapses. Today he rates his depression 4/10 and anxiety 7/10 with 10 being the worse. He continues to have mild but intermittent suicidal ideation, but none at this time.  He denied any side effects to his current medications. We began to discuss his discharge planning.   Principal Problem: Bipolar 1 disorder, depressed, severe (HCC) Diagnosis:   Patient Active Problem List   Diagnosis Date Noted  . Bipolar 1 disorder, depressed, severe (HCC) [F31.4] 10/05/2017  . Borderline personality disorder (HCC) [F60.3]   . Elevated liver enzymes [R74.8] 09/24/2015  . Obesity [E66.9] 09/24/2015  . Generalized anxiety disorder [F41.1] 03/05/2015  . Bipolar disorder with depression (HCC) [F31.9]   . Bipolar 1 disorder (HCC) [F31.9] 10/11/2013  .  Mood disorder (HCC) [F39] 05/20/2011  . History of eating disorder [Z86.59] 05/20/2011   Total Time spent with patient: 20 minutes  Past Psychiatric History: See admission H&P  Past Medical History:  Past Medical History:  Diagnosis Date  . Abnormal laboratory test 02/12/2014  . Anxiety   . Bipolar disorder (HCC)   . Depression   . Hx of substance abuse 02/12/2014  . Psychosis [facility])     Past Surgical History:  Procedure Laterality Date  . TONSILLECTOMY AND ADENOIDECTOMY     Family History:  Family History  Problem Relation Age of Onset  . Depression Mother   . Anxiety disorder Mother   . Bipolar disorder Maternal Aunt    Family Psychiatric  History: See admission H&P Social History:  Social History   Substance and Sexual Activity  Alcohol Use Yes  . Alcohol/week: 1.0 - 2.0 standard drinks  . Types: 1 - 2 Cans of beer per week   Comment: rare     Social History   Substance and Sexual Activity  Drug Use Yes  . Types: Marijuana   Comment: no longer using    Social History   Socioeconomic History  . Marital status: Single    Spouse name: Not on file  . Number of children: Not on file  . Years of education: Not on file  . Highest education level: Not on file  Occupational History  . Not on file  Social Needs  . Financial resource strain: Not on file  . Food insecurity:    Worry: Not on file    Inability: Not on  file  . Transportation needs:    Medical: Not on file    Non-medical: Not on file  Tobacco Use  . Smoking status: Light Tobacco Smoker    Packs/day: 0.25    Years: 1.00    Pack years: 0.25    Types: Cigarettes  . Smokeless tobacco: Never Used  . Tobacco comment: down to a few cigarettes daily  Substance and Sexual Activity  . Alcohol use: Yes    Alcohol/week: 1.0 - 2.0 standard drinks    Types: 1 - 2 Cans of beer per week    Comment: rare  . Drug use: Yes    Types: Marijuana    Comment: no longer using  . Sexual activity: Never   Lifestyle  . Physical activity:    Days per week: Not on file    Minutes per session: Not on file  . Stress: Not on file  Relationships  . Social connections:    Talks on phone: Not on file    Gets together: Not on file    Attends religious service: Not on file    Active member of club or organization: Not on file    Attends meetings of clubs or organizations: Not on file    Relationship status: Not on file  Other Topics Concern  . Not on file  Social History Narrative  . Not on file   Additional Social History:    Pain Medications: None Prescriptions: Flouoxetine 40mg  Over the Counter: None History of alcohol / drug use?: Yes Longest period of sobriety (when/how long): denies Name of Substance 1: ETOH 1 - Age of First Use: 22 years of age 97 - Amount (size/oz): Varies 1 - Frequency: Less than once a week but more than 2x/M 1 - Duration: off and on 1 - Last Use / Amount: 08/20     Sleep: Good  Appetite:  Fair  Current Medications: Current Facility-Administered Medications  Medication Dose Route Frequency Provider Last Rate Last Dose  . acetaminophen (TYLENOL) tablet 650 mg  650 mg Oral Q6H PRN Nira Conn A, NP      . alum & mag hydroxide-simeth (MAALOX/MYLANTA) 200-200-20 MG/5ML suspension 30 mL  30 mL Oral Q4H PRN Nira Conn A, NP      . ARIPiprazole (ABILIFY) tablet 5 mg  5 mg Oral Daily Cobos, Rockey Situ, MD   5 mg at 12/10/17 0744  . hydrOXYzine (ATARAX/VISTARIL) tablet 25 mg  25 mg Oral TID PRN Jackelyn Poling, NP   25 mg at 12/10/17 0924  . magnesium hydroxide (MILK OF MAGNESIA) suspension 30 mL  30 mL Oral Daily PRN Nira Conn A, NP      . pantoprazole (PROTONIX) EC tablet 20 mg  20 mg Oral Daily Cobos, Rockey Situ, MD   20 mg at 12/10/17 0744  . traZODone (DESYREL) tablet 50 mg  50 mg Oral QHS PRN Nira Conn A, NP   50 mg at 12/09/17 2206  . venlafaxine XR (EFFEXOR-XR) 24 hr capsule 37.5 mg  37.5 mg Oral Q breakfast Cobos, Rockey Situ, MD   37.5 mg at  12/10/17 0454    Lab Results:  Results for orders placed or performed during the hospital encounter of 12/07/17 (from the past 48 hour(s))  Urine rapid drug screen (hosp performed)not at [facility]     Status: None   Collection Time: 12/08/17  2:02 PM  Result Value Ref Range   Opiates NONE DETECTED NONE DETECTED   Cocaine NONE DETECTED NONE DETECTED  Benzodiazepines NONE DETECTED NONE DETECTED   Amphetamines NONE DETECTED NONE DETECTED   Tetrahydrocannabinol NONE DETECTED NONE DETECTED   Barbiturates NONE DETECTED NONE DETECTED    Comment: (NOTE) DRUG SCREEN FOR MEDICAL PURPOSES ONLY.  IF CONFIRMATION IS NEEDED FOR ANY PURPOSE, NOTIFY LAB WITHIN 5 DAYS. LOWEST DETECTABLE LIMITS FOR URINE DRUG SCREEN Drug Class                     Cutoff (ng/mL) Amphetamine and metabolites    1000 Barbiturate and metabolites    200 Benzodiazepine                 200 Tricyclics and metabolites     300 Opiates and metabolites        300 Cocaine and metabolites        300 THC                            50 Performed at [facility], [address] [place], [place] [address]   Hemoglobin A1c     Status: Abnormal   Collection Time: 12/09/17  6:46 AM  Result Value Ref Range   Hgb A1c MFr Bld 4.7 (L) 4.8 - 5.6 %    Comment: (NOTE) Pre diabetes:          5.7%-6.4% Diabetes:              >6.4% Glycemic control for   <7.0% adults with diabetes    Mean Plasma Glucose 88.19 mg/dL    Comment: Performed at [facility], [address] [place], [place] [address]  Lipid panel     Status: Abnormal   Collection Time: 12/09/17  6:46 AM  Result Value Ref Range   Cholesterol 204 (H) 0 - 200 mg/dL   Triglycerides 010168 (H) <150 mg/dL   HDL 33 (L) >93>40 mg/dL   Total CHOL/HDL Ratio 6.2 RATIO   VLDL 34 0 - 40 mg/dL   LDL Cholesterol 235137 (H) 0 - 99 mg/dL    Comment:        Total Cholesterol/HDL:CHD Risk Coronary Heart Disease Risk Table                     Men   Women  1/2 Average Risk    3.4   3.3  Average Risk       5.0   4.4  2 X Average Risk   9.6   7.1  3 X Average Risk  23.4   11.0        Use the calculated Patient Ratio above and the CHD Risk Table to determine the patient's CHD Risk.        ATP III CLASSIFICATION (LDL):  <100     mg/dL   Optimal  [id number]  mg/dL   Near or Above                    Optimal  130-159  mg/dL   Borderline  [id number]  mg/dL   High  >623>190     mg/dL   Very High Performed at [facility], [address] [place], [place] [address]     Blood Alcohol level:  Lab Results  Component Value Date   [facility] 10/04/2017    Metabolic Disorder Labs: Lab Results  Component Value Date   HGBA1C 4.7 (L) 12/09/2017   MPG 88.19 12/09/2017  Lab Results  Component Value Date   PROLACTIN 5.0 04/29/2014   PROLACTIN 46.1 (H) 12/17/2013   Lab Results  Component Value Date   CHOL 204 (H) 12/09/2017   TRIG 168 (H) 12/09/2017   HDL 33 (L) 12/09/2017   CHOLHDL 6.2 12/09/2017   VLDL 34 12/09/2017   LDLCALC 137 (H) 12/09/2017    Physical Findings: AIMS: Facial and Oral Movements Muscles of Facial Expression: None, normal Lips and Perioral Area: None, normal Jaw: None, normal Tongue: None, normal,Extremity Movements Upper (arms, wrists, hands, fingers): None, normal Lower (legs, knees, ankles, toes): None, normal, Trunk Movements Neck, shoulders, hips: None, normal, Overall Severity Severity of abnormal movements (highest score from questions above): None, normal Incapacitation due to abnormal movements: None, normal Patient's awareness of abnormal movements (rate only patient's report): No Awareness, Dental Status Current problems with teeth and/or dentures?: No Does patient usually wear dentures?: No  CIWA:    COWS:     Musculoskeletal: Strength & Muscle Tone: within normal limits Gait & Station: normal Patient leans: N/A  Psychiatric Specialty Exam: Physical Exam  Constitutional: He is oriented to person,  place, and time. He appears well-developed and well-nourished.  HENT:  Head: Normocephalic and atraumatic.  Respiratory: Effort normal.  Neurological: He is alert and oriented to person, place, and time.    ROS   Blood pressure 118/71, pulse 89, temperature 97.8 F (36.6 C), temperature source Oral, resp. rate 18, height 5\' 7"  (1.702 m), weight 128.4 kg, SpO2 98 %.Body mass index is 44.32 kg/m.  General Appearance: Casual  Eye Contact:  Fair  Speech:  Normal Rate  Volume:  Normal  Mood:  Anxious and Depressed  Affect:  Congruent  Thought Process:  Coherent, Linear and Descriptions of Associations: Intact  Orientation:  Full (Time, Place, and Person)  Thought Content:  WDL  Suicidal Thoughts:  No  Homicidal Thoughts:  No  Memory:  Immediate;   Fair Recent;   Fair Remote;   Fair  Judgement:  Intact  Insight:  Fair  Psychomotor Activity:  Normal  Concentration:  Concentration: Fair and Attention Span: Fair  Recall:  [employer] of Knowledge:  Fair  Language:  Fair  Akathisia:  Negative  Handed:  Right  AIMS (if indicated):     Assets:  Communication Skills Desire for Improvement Financial Resources/Insurance Housing Physical Health Resilience Transportation  ADL's:  Intact  Cognition:  WNL  Sleep:  Number of Hours: 3.5    Treatment Plan Summary: Daily contact with patient to assess and evaluate symptoms and progress in treatment and Medication management  1 Admit for crisis management and stabilization.  2. Medication management to reduce symptoms to baseline and improved the patient's overall level of functioning. Closely monitor the side effects, efficacy and therapeutic response of medication.  3. Treat health problem as indicated. See MAR 4. Developed treatment plan to decrease the risk of relapse upon discharge and to reduce the need for readmission.  5. Psychosocial education regarding relapse prevention in self-care.  6. Healthcare followup as needed for  medical problems and called consults as indicated.  7. Increase collateral information.  8. Restart home medication where appropriate  9. Encouraged to participate and verbalize into group milieu therapy.   Truman Hayward, FNP 12/10/2017, 11:19 AM

## 2017-12-10 NOTE — [facility] Group Notes (Signed)
[facility] LCSW Group Therapy Note  Date/Time:  12/10/2017 9:00-10:00 or 10:00-11:00AM  Type of Therapy and Topic:  Group Therapy:  Healthy and Unhealthy Supports  Participation Level:  Active   Description of Group:  Patients in this group were introduced to the idea of adding a variety of healthy supports to address the various needs in their lives.Patients discussed what additional healthy supports could be helpful in their recovery and wellness after discharge in order to prevent future hospitalizations.   An emphasis was placed on using counselor, doctor, therapy groups, 12-step groups, and problem-specific support groups to expand supports.  They also worked as a group on developing a specific plan for several patients to deal with unhealthy supports through boundary-setting, psychoeducation with loved ones, and even termination of relationships.   Therapeutic Goals:   1)  discuss importance of adding supports to stay well once out of the hospital  2)  compare healthy versus unhealthy supports and identify some examples of each  3)  generate ideas and descriptions of healthy supports that can be added  4)  offer mutual support about how to address unhealthy supports  5)  encourage active participation in and adherence to discharge plan    Summary of Patient Progress:  The patient stated that current healthy supports in his life are his parents although they do not understand how he feels, especially his mother who helped manage his money, while current unhealthy supports include himself because "nothing is working, I want to just sleep all day, and am my own worst enemy."  He said that his step mother and step father are also horrible supports for him.  He said one major problem he has is forgetting to take his medicines.  The patient expressed a willingness to add new ways to remember his medicines as support(s) to help in his recovery journey.   Therapeutic Modalities:   Motivational  Interviewing Brief Solution-Focused Therapy  Ambrose MantleMareida Grossman-Orr, LCSW

## 2017-12-11 MED ORDER — VENLAFAXINE HCL ER 75 MG PO CP24
75.0000 mg | ORAL_CAPSULE | Freq: Every day | ORAL | Status: DC
  Administered 2017-12-12 – 2017-12-15 (×4): 75 mg via ORAL
  Filled 2017-12-11 (×6): qty 1

## 2017-12-11 NOTE — [facility] Group Notes (Signed)
[facility] LCSW Group Therapy Note  Date/Time: 12/11/17, 1315  Type of Therapy and Topic:  Group Therapy:  Overcoming Obstacles  Participation Level:  moderate  Description of Group:    In this group patients will be encouraged to explore what they see as obstacles to their own wellness and recovery. They will be guided to discuss their thoughts, feelings, and behaviors related to these obstacles. The group will process together ways to cope with barriers, with attention given to specific choices patients can make. Each patient will be challenged to identify changes they are motivated to make in order to overcome their obstacles. This group will be process-oriented, with patients participating in exploration of their own experiences as well as giving and receiving support and challenge from other group members.  Therapeutic Goals: 1. Patient will identify personal and current obstacles as they relate to admission. 2. Patient will identify barriers that currently interfere with their wellness or overcoming obstacles.  3. Patient will identify feelings, thought process and behaviors related to these barriers. 4. Patient will identify two changes they are willing to make to overcome these obstacles:    Summary of Patient Progress: Pt shared that mood swings and bipolar disorder are current obstacles.  Pt was active during group discussion regarding positive ways to overcome obstacles.        Therapeutic Modalities:   Cognitive Behavioral Therapy Solution Focused Therapy Motivational Interviewing Relapse Prevention Therapy  Daleen SquibbGreg Krishiv Sandler, LCSW

## 2017-12-11 NOTE — Progress Notes (Signed)
Recreation Therapy Notes  Date: 8.26.19 Time: 0930 Location: [address] Dayroom  Group Topic: Stress Management  Goal Area(s) Addresses:  Patient will verbalize importance of using healthy stress management.  Patient will identify positive emotions associated with healthy stress management.   Intervention: Stress Management  Activity :  Guided Imagery.  LRT introduced the stress management technique of guided imagery.  LRT played a meditation on the computer for patients to engage in the activity.  Patients were to follow along as the meditation played.  Education:  Stress Management, Discharge Planning.   Education Outcome: Acknowledges edcuation/In group clarification offered/Needs additional education  Clinical Observations/Feedback: Pt did not attend group.     Caroll RancherMarjette Omeed Osuna, LRT/CTRS         Caroll RancherLindsay, Daman Steffenhagen A 12/11/2017 12:21 PM

## 2017-12-11 NOTE — Plan of Care (Signed)
  Problem: Medication: Goal: Compliance with prescribed medication regimen will improve Outcome: Progressing   Problem: Activity: Goal: Interest or engagement in activities will improve Outcome: Progressing   Problem: Safety: Goal: Periods of time without injury will increase Outcome: Progressing  DAR NOTE: Patient presents with anxious affect and depressed mood.  Denies suicidal thoughts, pain, auditory and visual hallucinations.  Described energy level as low and concentration as poor.  Rates depression at 4, hopelessness at 4, and anxiety at 5.  Maintained on routine safety checks.  Medications given as prescribed.  Support and encouragement offered as needed.  Attended group and participated.  States goal for today is "to know exactly what is my diagnosis."  Patient observed socializing with peers in the dayroom.  Offered no complaint.

## 2017-12-11 NOTE — Progress Notes (Signed)
[facility] MD Progress Note  12/11/2017 8:08 AM Andrew Noble  MRN:  [id number]   Subjective: Patient reports he is feeling partially better than he did on admission. Denies medication side effects at this time. States yesterday had some fleeting passive suicidal ideations, which he describes as " feeling like not knowing what to do, not wanting to be here", but without any plan or intention. He states that today feeling better , denies current suicidal ideations at this time. Objective: I have discussed case with treatment team and have met with patient.  22 year old single male, employed , admitted due to worsening depression, intermittent suicidal ideations, neuro-vegetative symptoms of depression. He reports prior history of Bipolar Disorder, and describes brief episodes suggestive of hypomania. At this time reports partially improved mood, but as above, reports lingering depression and recent intermittent passive SI ( none today). Describes mood as " 5/10" today.  Denies medication side effects at this time. Has been going to groups, visible on unit, no disruptive or agitated behaviors.  Principal Problem: Bipolar 1 disorder, depressed, severe ([place]) Diagnosis:   Patient Active Problem List   Diagnosis Date Noted  . Bipolar 1 disorder, depressed, severe ([place]) [F31.4] 10/05/2017  . Borderline personality disorder ([place]) [F60.3]   . Elevated liver enzymes [R74.8] 09/24/2015  . Obesity [E66.9] 09/24/2015  . Generalized anxiety disorder [F41.1] 03/05/2015  . Bipolar disorder with depression ([place]) [F31.9]   . Bipolar 1 disorder ([place]) [F31.9] 10/11/2013  . Mood disorder ([place]) [F39] 05/20/2011  . History of eating disorder [Z86.59] 05/20/2011   Total Time spent with patient: 20 minutes  Past Psychiatric History: See admission H&P  Past Medical History:  Past Medical History:  Diagnosis Date  . Abnormal laboratory test 02/12/2014  . Anxiety   . Bipolar disorder ([place])   . Depression   . Hx of  substance abuse 02/12/2014  . Psychosis [facility])     Past Surgical History:  Procedure Laterality Date  . TONSILLECTOMY AND ADENOIDECTOMY     Family History:  Family History  Problem Relation Age of Onset  . Depression Mother   . Anxiety disorder Mother   . Bipolar disorder Maternal Aunt    Family Psychiatric  History: See admission H&P Social History:  Social History   Substance and Sexual Activity  Alcohol Use Yes  . Alcohol/week: 1.0 - 2.0 standard drinks  . Types: 1 - 2 Cans of beer per week   Comment: rare     Social History   Substance and Sexual Activity  Drug Use Yes  . Types: Marijuana   Comment: no longer using    Social History   Socioeconomic History  . Marital status: Single    Spouse name: Not on file  . Number of children: Not on file  . Years of education: Not on file  . Highest education level: Not on file  Occupational History  . Not on file  Social Needs  . Financial resource strain: Not on file  . Food insecurity:    Worry: Not on file    Inability: Not on file  . Transportation needs:    Medical: Not on file    Non-medical: Not on file  Tobacco Use  . Smoking status: Light Tobacco Smoker    Packs/day: 0.25    Years: 1.00    Pack years: 0.25    Types: Cigarettes  . Smokeless tobacco: Never Used  . Tobacco comment: down to a few cigarettes daily  Substance and Sexual Activity  .  Alcohol use: Yes    Alcohol/week: 1.0 - 2.0 standard drinks    Types: 1 - 2 Cans of beer per week    Comment: rare  . Drug use: Yes    Types: Marijuana    Comment: no longer using  . Sexual activity: Never  Lifestyle  . Physical activity:    Days per week: Not on file    Minutes per session: Not on file  . Stress: Not on file  Relationships  . Social connections:    Talks on phone: Not on file    Gets together: Not on file    Attends religious service: Not on file    Active member of club or organization: Not on file    Attends meetings of clubs or  organizations: Not on file    Relationship status: Not on file  Other Topics Concern  . Not on file  Social History Narrative  . Not on file   Additional Social History:    Pain Medications: None Prescriptions: Flouoxetine 91m Over the Counter: None History of alcohol / drug use?: Yes Longest period of sobriety (when/how long): denies Name of Substance 1: ETOH 1 - Age of First Use: 22years of age 22- Amount (size/oz): Varies 1 - Frequency: Less than once a week but more than 2x/M 1 - Duration: off and on 1 - Last Use / Amount: 08/20     Sleep: reports sleep has improved - uses CPAP for Sleep Apnea  Appetite:  Good  Current Medications: Current Facility-Administered Medications  Medication Dose Route Frequency Provider Last Rate Last Dose  . acetaminophen (TYLENOL) tablet 650 mg  650 mg Oral Q6H PRN BLindon RompA, NP      . alum & mag hydroxide-simeth (MAALOX/MYLANTA) 200-200-20 MG/5ML suspension 30 mL  30 mL Oral Q4H PRN BLindon RompA, NP      . ARIPiprazole (ABILIFY) tablet 5 mg  5 mg Oral Daily Rondi Ivy, FMyer Peer MD   5 mg at 12/11/17 00923 . hydrOXYzine (ATARAX/VISTARIL) tablet 25 mg  25 mg Oral TID PRN BRozetta Nunnery NP   25 mg at 12/10/17 2221  . magnesium hydroxide (MILK OF MAGNESIA) suspension 30 mL  30 mL Oral Daily PRN BLindon RompA, NP      . pantoprazole (PROTONIX) EC tablet 20 mg  20 mg Oral Daily Takahiro Godinho, FMyer Peer MD   20 mg at 12/11/17 0742  . traZODone (DESYREL) tablet 50 mg  50 mg Oral QHS PRN BLindon RompA, NP   50 mg at 12/10/17 2221  . venlafaxine XR (EFFEXOR-XR) 24 hr capsule 37.5 mg  37.5 mg Oral Q breakfast Eilidh Marcano, FMyer Peer MD   37.5 mg at 12/11/17 03007   Lab Results:  No results found for this or any previous visit (from the past 48 hour(s)).  Blood Alcohol level:  Lab Results  Component Value Date   ETH <10 062/26/3335   Metabolic Disorder Labs: Lab Results  Component Value Date   HGBA1C 4.7 (L) 12/09/2017   MPG 88.19 12/09/2017    Lab Results  Component Value Date   PROLACTIN 5.0 04/29/2014   PROLACTIN 46.1 (H) 12/17/2013   Lab Results  Component Value Date   CHOL 204 (H) 12/09/2017   TRIG 168 (H) 12/09/2017   HDL 33 (L) 12/09/2017   CHOLHDL 6.2 12/09/2017   VLDL 34 12/09/2017   LDLCALC 137 (H) 12/09/2017    Physical Findings: AIMS: Facial and Oral Movements  Muscles of Facial Expression: None, normal Lips and Perioral Area: None, normal Jaw: None, normal Tongue: None, normal,Extremity Movements Upper (arms, wrists, hands, fingers): None, normal Lower (legs, knees, ankles, toes): None, normal, Trunk Movements Neck, shoulders, hips: None, normal, Overall Severity Severity of abnormal movements (highest score from questions above): None, normal Incapacitation due to abnormal movements: None, normal Patient's awareness of abnormal movements (rate only patient's report): No Awareness, Dental Status Current problems with teeth and/or dentures?: No Does patient usually wear dentures?: No  CIWA:    COWS:     Musculoskeletal: Strength & Muscle Tone: within normal limits Gait & Station: normal Patient leans: N/A  Psychiatric Specialty Exam: Physical Exam  Constitutional: He is oriented to person, place, and time. He appears well-developed and well-nourished.  HENT:  Head: Normocephalic and atraumatic.  Respiratory: Effort normal.  Neurological: He is alert and oriented to person, place, and time.    ROS  Mild headache, no chest pain, no shortness of breath, reports improved nausea, no vomiting , no diarrhea. No fever, no rash  Blood pressure 118/71, pulse 89, temperature 97.8 F (36.6 C), temperature source Oral, resp. rate 18, height 5' 7"  (1.702 m), weight 128.4 kg, SpO2 98 %.Body mass index is 44.32 kg/m.  General Appearance: improving grooming   Eye Contact:  improving eye contact   Speech:  Normal Rate- not pressured or loud   Volume:  Normal  Mood:  some improvement, still vaguely  depressed, anxious   Affect:  Congruent, does smile briefly at times   Thought Process:  Linear and Descriptions of Associations: Intact  Orientation:  Full (Time, Place, and Person)  Thought Content:  No hallucinations, no delusions, not internally preoccupied   Suicidal Thoughts:  No- describes chronic intermittent suicidal ideations and states he had passive SI yesterday, but denies any SI today. Contracts for safety at this time .  Homicidal Thoughts:  No denies violent ideations, denies HI  Memory: recent and remote grossly intact   Judgement:  Other:  improving   Insight:  Fair and improving   Psychomotor Activity:  Normal  Concentration:  Concentration: Good and Attention Span: Good  Recall:  Good  Fund of Knowledge:  Good  Language:  Good  Akathisia:  Negative  Handed:  Right  AIMS (if indicated):     Assets:  Communication Skills Desire for Improvement Financial Resources/Insurance Housing Physical Health Resilience Transportation  ADL's:  Intact  Cognition:  WNL  Sleep:  Number of Hours: 3.5   Assessment - patient is a 22 year old single male, employed. He presented due to worsening depression, intermittent suicidal ideations, with thoughts of overdosing . Reports prior diagnosis of Bipolar Disorder, and describes brief episodes of increased energy, decreased need for sleep.  Currently on Effexor XR and Abilify trial- tolerating well so far .  Reports partially improved mood but remains vaguely depressed, ruminative, and describes recent passive SI, although none today.  .  Treatment Plan Summary: Daily contact with patient to assess and evaluate symptoms and progress in treatment and Medication management Encourage group and milieu participation to work on coping skills and symptom reduction Increase Effexor XR to 75 mgrs QDAY for depression, anxiety Continue Abilify 5 mgrs QDAY for mood disorder Continue Vistaril 25 mgrs Q 8 hours PRN for anxiety as needed  Continue  Trazodone 50 mgrs QHS PRN for insomnia as needed  Continue Protonix for GERD  Treatment team working on disposition planning options  Jenne Campus, MD 12/11/2017, 8:08 AM  Patient ID: Andrew Noble, male   DOB: 1995/10/03, 22 y.o.   MRN: [id number]

## 2017-12-11 NOTE — Progress Notes (Signed)
D   Pt reports everything going good today until about ten minutes ago however patient would not elaborate on what was bothering him   He smiled and said I dont want to talk about it    Pt interacts frequently and appropriately with others and is active in groups     A   Verbal support given    Medications administered and effectiveness monitored    Q 15 min checks R    Pt is safe at this time

## 2017-12-11 NOTE — Progress Notes (Signed)
Pt attend wrap up group. He can not rate his day. Pt said he is tired coming to the hospital. He feels bad. He did not want his grandparents to know how he feels and he is in the hospital. Pt said this stay is not meaningful. The next time he comes to a hospital he hopes to be in a body bag. Pt said there is not use for him. Pt said his mood changes one hour to the next. Pt became tearful. Pt said he do not feel the medication working for him. Pt said he do not know what to do. He has tried all he know but family do not understand him. They do not want to listen and understand what he feels and going on in his life. Pt became even more tearful.

## 2017-12-12 DIAGNOSIS — F1290 Cannabis use, unspecified, uncomplicated: Secondary | ICD-10-CM

## 2017-12-12 LAB — COMPREHENSIVE METABOLIC PANEL
ALT: 86 U/L — ABNORMAL HIGH (ref 0–44)
AST: 39 U/L (ref 15–41)
Albumin: 4.3 g/dL (ref 3.5–5.0)
Alkaline Phosphatase: 77 U/L (ref 38–126)
Anion gap: 11 (ref 5–15)
BILIRUBIN TOTAL: 0.4 mg/dL (ref 0.3–1.2)
BUN: 12 mg/dL (ref 6–20)
CO2: 25 mmol/L (ref 22–32)
Calcium: 9.7 mg/dL (ref 8.9–10.3)
Chloride: 106 mmol/L (ref 98–111)
Creatinine, Ser: 1.04 mg/dL (ref 0.61–1.24)
Glucose, Bld: 147 mg/dL — ABNORMAL HIGH (ref 70–99)
POTASSIUM: 3.8 mmol/L (ref 3.5–5.1)
Sodium: 142 mmol/L (ref 135–145)
TOTAL PROTEIN: 7.1 g/dL (ref 6.5–8.1)

## 2017-12-12 MED ORDER — ARIPIPRAZOLE ER 400 MG IM SRER
400.0000 mg | Freq: Once | INTRAMUSCULAR | Status: AC
  Administered 2017-12-12: 400 mg via INTRAMUSCULAR

## 2017-12-12 NOTE — Plan of Care (Signed)
Nurse discussed anxiety, depression, coping skills with patient. 

## 2017-12-12 NOTE — Progress Notes (Signed)
D:  Patient's self inventory sheet, patient has fair sleep, sleep medication helpful.  Good appetite, low energy level, poor concentration.  Rated depression and anxiety 6, hopeless 5.  Denied withdrawals, checked agitation, irritability.  SI, contracts for safety.  Physical problems, headaches.  Physical pain, worst pain #2 in past 24 hours, head.  Goal is work on how he truly feels.  Plans to talk to MD.  Plans to write feelings.  No discharge plans. A:  Medications administered per MD orders.  Emotional support and encouragement given patient. R:  Safety maintained with 15 minute checks. Patient denied SI feelings while talking to nurse this morning, contracts for safety.  Denied HI.  Denied A/V hallucinations.

## 2017-12-12 NOTE — [facility] Group Notes (Signed)
[facility] Group Notes:  (Nursing/MHT/Case Management/Adjunct)  Date:  12/12/2017  Time:  3:15 pm  Type of Therapy:  Psychoeducational Skills  Participation Level:  Active  Participation Quality:  Appropriate  Affect:  Appropriate  Cognitive:  Appropriate  Insight:  Appropriate  Engagement in Group:  Engaged  Modes of Intervention:  Education  Summary of Progress/Problems: Patient participated appropriately in group.   Quintella ReichertKnight, Samy Ryner LathropShephard 12/12/2017, 6:04 PM

## 2017-12-12 NOTE — Progress Notes (Signed)
[facility] MD Progress Note  12/12/2017 3:09 PM Andrew Noble  MRN:  [id number] Subjective: Patient is seen and examined.  Patient is a 22 year old male with a past psychiatric history significant for bipolar disorder who was admitted because of suicidal ideation and brief episodes suggestive of hypomania.  He also reports depression.  He stated that he is tired of being admitted to the hospital.  He is tired of having to go back and forth.  He is tired of getting angry at work.  His boss at work told him if he does not get this under control he will have to find another job.  He stated he felt better when he was taking the long-acting Abilify injection.  That is been stopped by his outpatient psychiatrist previously.  He is on oral Abilify currently.  He also complained of some right-sided flank pain.  He denied any problems with the gallbladder in the past.  He did admit to continued suicidal ideation. Principal Problem: Bipolar 1 disorder, depressed, severe ([facility]) Diagnosis:   Patient Active Problem List   Diagnosis Date Noted  . Bipolar 1 disorder, depressed, severe ([facility]) [F31.4] 10/05/2017  . Borderline personality disorder ([facility]) [F60.3]   . Elevated liver enzymes [R74.8] 09/24/2015  . Obesity [E66.9] 09/24/2015  . Generalized anxiety disorder [F41.1] 03/05/2015  . Bipolar disorder with depression ([facility]) [F31.9]   . Bipolar 1 disorder ([facility]) [F31.9] 10/11/2013  . Mood disorder ([facility]) [F39] 05/20/2011  . History of eating disorder [Z86.59] 05/20/2011   Total Time spent with patient: 15 minutes  Past Psychiatric History: See admission H&P  Past Medical History:  Past Medical History:  Diagnosis Date  . Abnormal laboratory test 02/12/2014  . Anxiety   . Bipolar disorder ([facility])   . Depression   . Hx of substance abuse 02/12/2014  . Psychosis [facility])     Past Surgical History:  Procedure Laterality Date  . TONSILLECTOMY AND ADENOIDECTOMY     Family History:  Family History  Problem Relation  Age of Onset  . Depression Mother   . Anxiety disorder Mother   . Bipolar disorder Maternal Aunt    Family Psychiatric  History: See admission H&P Social History:  Social History   Substance and Sexual Activity  Alcohol Use Yes  . Alcohol/week: 1.0 - 2.0 standard drinks  . Types: 1 - 2 Cans of beer per week   Comment: rare     Social History   Substance and Sexual Activity  Drug Use Yes  . Types: Marijuana   Comment: no longer using    Social History   Socioeconomic History  . Marital status: Single    Spouse name: Not on file  . Number of children: Not on file  . Years of education: Not on file  . Highest education level: Not on file  Occupational History  . Not on file  Social Needs  . Financial resource strain: Not on file  . Food insecurity:    Worry: Not on file    Inability: Not on file  . Transportation needs:    Medical: Not on file    Non-medical: Not on file  Tobacco Use  . Smoking status: Light Tobacco Smoker    Packs/day: 0.25    Years: 1.00    Pack years: 0.25    Types: Cigarettes  . Smokeless tobacco: Never Used  . Tobacco comment: down to a few cigarettes daily  Substance and Sexual Activity  . Alcohol use: Yes    Alcohol/week: 1.0 -  2.0 standard drinks    Types: 1 - 2 Cans of beer per week    Comment: rare  . Drug use: Yes    Types: Marijuana    Comment: no longer using  . Sexual activity: Never  Lifestyle  . Physical activity:    Days per week: Not on file    Minutes per session: Not on file  . Stress: Not on file  Relationships  . Social connections:    Talks on phone: Not on file    Gets together: Not on file    Attends religious service: Not on file    Active member of club or organization: Not on file    Attends meetings of clubs or organizations: Not on file    Relationship status: Not on file  Other Topics Concern  . Not on file  Social History Narrative  . Not on file   Additional Social History:    Pain Medications:  None Prescriptions: Flouoxetine 40mg  Over the Counter: None History of alcohol / drug use?: Yes Longest period of sobriety (when/how long): denies Name of Substance 1: ETOH 1 - Age of First Use: 22 years of age 43 - Amount (size/oz): Varies 1 - Frequency: Less than once a week but more than 2x/M 1 - Duration: off and on 1 - Last Use / Amount: 08/20                  Sleep: Fair  Appetite:  Fair  Current Medications: Current Facility-Administered Medications  Medication Dose Route Frequency Provider Last Rate Last Dose  . acetaminophen (TYLENOL) tablet 650 mg  650 mg Oral Q6H PRN Nira ConnBerry, Jason A, NP   650 mg at 12/12/17 1311  . alum & mag hydroxide-simeth (MAALOX/MYLANTA) 200-200-20 MG/5ML suspension 30 mL  30 mL Oral Q4H PRN Nira ConnBerry, Jason A, NP      . ARIPiprazole (ABILIFY) tablet 5 mg  5 mg Oral Daily Cobos, Rockey SituFernando A, MD   5 mg at 12/12/17 0754  . hydrOXYzine (ATARAX/VISTARIL) tablet 25 mg  25 mg Oral TID PRN Nira ConnBerry, Jason A, NP   25 mg at 12/12/17 0756  . magnesium hydroxide (MILK OF MAGNESIA) suspension 30 mL  30 mL Oral Daily PRN Nira ConnBerry, Jason A, NP      . pantoprazole (PROTONIX) EC tablet 20 mg  20 mg Oral Daily Cobos, Rockey SituFernando A, MD   20 mg at 12/12/17 0755  . traZODone (DESYREL) tablet 50 mg  50 mg Oral QHS PRN Nira ConnBerry, Jason A, NP   50 mg at 12/11/17 2224  . venlafaxine XR (EFFEXOR-XR) 24 hr capsule 75 mg  75 mg Oral Q breakfast Cobos, Rockey SituFernando A, MD   75 mg at 12/12/17 [id number]    Lab Results: No results found for this or any previous visit (from the past 48 hour(s)).  Blood Alcohol level:  Lab Results  Component Value Date   ETH <10 10/04/2017    Metabolic Disorder Labs: Lab Results  Component Value Date   HGBA1C 4.7 (L) 12/09/2017   MPG 88.19 12/09/2017   Lab Results  Component Value Date   PROLACTIN 5.0 04/29/2014   PROLACTIN 46.1 (H) 12/17/2013   Lab Results  Component Value Date   CHOL 204 (H) 12/09/2017   TRIG 168 (H) 12/09/2017   HDL 33 (L) 12/09/2017    CHOLHDL 6.2 12/09/2017   VLDL 34 12/09/2017   LDLCALC 137 (H) 12/09/2017    Physical Findings: AIMS: Facial and Oral Movements Muscles of Facial  Expression: None, normal Lips and Perioral Area: None, normal Jaw: None, normal Tongue: None, normal,Extremity Movements Upper (arms, wrists, hands, fingers): None, normal Lower (legs, knees, ankles, toes): None, normal, Trunk Movements Neck, shoulders, hips: None, normal, Overall Severity Severity of abnormal movements (highest score from questions above): None, normal Incapacitation due to abnormal movements: None, normal Patient's awareness of abnormal movements (rate only patient's report): No Awareness, Dental Status Current problems with teeth and/or dentures?: No Does patient usually wear dentures?: No  CIWA:    COWS:     Musculoskeletal: Strength & Muscle Tone: within normal limits Gait & Station: normal Patient leans: N/A  Psychiatric Specialty Exam: Physical Exam  Nursing note and vitals reviewed. Constitutional: He is oriented to person, place, and time. He appears well-developed and well-nourished.  HENT:  Head: Normocephalic and atraumatic.  Respiratory: Effort normal.  Neurological: He is alert and oriented to person, place, and time.    ROS  Blood pressure 118/71, pulse 89, temperature 97.8 F (36.6 C), temperature source Oral, resp. rate 18, height 5\' 7"  (1.702 m), weight 128.4 kg, SpO2 98 %.Body mass index is 44.32 kg/m.  General Appearance: Casual  Eye Contact:  Fair  Speech:  Normal Rate  Volume:  Normal  Mood:  Anxious, Depressed and Irritable  Affect:  Congruent  Thought Process:  Coherent and Descriptions of Associations: Intact  Orientation:  Full (Time, Place, and Person)  Thought Content:  Logical  Suicidal Thoughts:  Yes.  without intent/plan  Homicidal Thoughts:  No  Memory:  Immediate;   Fair Recent;   Fair Remote;   Fair  Judgement:  Fair  Insight:  Lacking  Psychomotor Activity:   Increased  Concentration:  Concentration: Fair and Attention Span: Fair  Recall:  [employer] of Knowledge:  Fair  Language:  Fair  Akathisia:  Negative  Handed:  Right  AIMS (if indicated):     Assets:  Communication Skills Desire for Improvement Financial Resources/Insurance Housing Physical Health Resilience Social Support  ADL's:  Intact  Cognition:  WNL  Sleep:  Number of Hours: 6     Treatment Plan Summary: Daily contact with patient to assess and evaluate symptoms and progress in treatment, Medication management and Plan : Patient is seen and examined.  Patient is a 22 year old male with the above-stated past psychiatric history seen in follow-up.  We will go on and give him the long-acting Abilify injection.  We will give him 400 mg today.  We will continue the Abilify for now.  He can continue on Effexor XR 75 mg p.o. daily.  He also complained of some right sided flank pain.  I told him we would check some liver function enzymes as well as kidney function.  He denied any previous problems with kidney stones or gallbladder.  No other changes to his medications.  Antonieta Pert, MD 12/12/2017, 3:09 PM

## 2017-12-12 NOTE — [facility] Group Notes (Signed)
[facility] Group Notes:  (Nursing/MHT/Case Management/Adjunct)  Date:   12/11/2017 Time:  4:00 pm  Type of Therapy:  Psychoeducational Skills  Participation Level:  Did Not Attend  Participation Quality:    Affect:    Cognitive:    Insight:    Engagement in Group:    Modes of Intervention:    Summary of Progress/Problems:  Earline MayotteKnight, Jemmie Ledgerwood Shephard 12/12/2017, 8:55 AM

## 2017-12-12 NOTE — Progress Notes (Signed)
Recreation Therapy Notes  Animal-Assisted Activity (AAA) Program Checklist/Progress Notes Patient Eligibility Criteria Checklist & Daily Group note for Rec Tx Intervention  Date: 8.27.19 Time: 1430 Location: [address] Dayroom   AAA/T Program Assumption of Risk Form signed by Patient/ or Parent Legal Guardian YES   Patient is free of allergies or sever asthma  YES   Patient reports no fear of animals  YES  Patient reports no history of cruelty to animals YES   Patient understands his/her participation is voluntary YES   Patient washes hands before animal contact YES   Patient washes hands after animal contact  YES   Behavioral Response: Engaged  Education: Hand Washing, Appropriate Animal Interaction   Education Outcome: Acknowledges understanding/In group clarification offered/Needs additional education.   Clinical Observations/Feedback: Pt attended and participated in group.    Negin Hegg, LRT/CTRS         Andrew Noble A 12/12/2017 3:41 PM 

## 2017-12-13 ENCOUNTER — Inpatient Hospital Stay (HOSPITAL_COMMUNITY): Payer: BLUE CROSS/BLUE SHIELD

## 2017-12-13 MED ORDER — SENNOSIDES-DOCUSATE SODIUM 8.6-50 MG PO TABS
1.0000 | ORAL_TABLET | Freq: Every evening | ORAL | Status: DC | PRN
  Administered 2017-12-13: 1 via ORAL
  Filled 2017-12-13: qty 1

## 2017-12-13 NOTE — Tx Team (Signed)
Interdisciplinary Treatment and Diagnostic Plan Update  12/13/2017 Time of Session: 1191YN Shuayb Schepers MRN: [id number]  Principal Diagnosis: Bipolar Disorder  Secondary Diagnoses: Principal Problem:   Bipolar 1 disorder, depressed, severe (HCC) Active Problems:   Borderline personality disorder (HCC)   Current Medications:  Current Facility-Administered Medications  Medication Dose Route Frequency Provider Last Rate Last Dose  . acetaminophen (TYLENOL) tablet 650 mg  650 mg Oral Q6H PRN Nira Conn A, NP   650 mg at 12/12/17 1311  . alum & mag hydroxide-simeth (MAALOX/MYLANTA) 200-200-20 MG/5ML suspension 30 mL  30 mL Oral Q4H PRN Nira Conn A, NP      . ARIPiprazole (ABILIFY) tablet 5 mg  5 mg Oral Daily Cobos, Rockey Situ, MD   5 mg at 12/13/17 0806  . hydrOXYzine (ATARAX/VISTARIL) tablet 25 mg  25 mg Oral TID PRN Jackelyn Poling, NP   25 mg at 12/13/17 0809  . magnesium hydroxide (MILK OF MAGNESIA) suspension 30 mL  30 mL Oral Daily PRN Nira Conn A, NP      . pantoprazole (PROTONIX) EC tablet 20 mg  20 mg Oral Daily Cobos, Rockey Situ, MD   20 mg at 12/13/17 0806  . traZODone (DESYREL) tablet 50 mg  50 mg Oral QHS PRN Nira Conn A, NP   50 mg at 12/11/17 2224  . venlafaxine XR (EFFEXOR-XR) 24 hr capsule 75 mg  75 mg Oral Q breakfast Cobos, Rockey Situ, MD   75 mg at 12/13/17 0806   PTA Medications: Medications Prior to Admission  Medication Sig Dispense Refill Last Dose  . FLUoxetine (PROZAC) 40 MG capsule Take 1 capsule (40 mg total) by mouth daily. 90 capsule 0 Past Week  . lithium carbonate 600 MG capsule Take 1 capsule (600 mg total) by mouth at bedtime. (Patient not taking: Reported on 12/08/2017) 30 capsule 0 Not Taking at Unknown time  . propranolol ER (INDERAL LA) 60 MG 24 hr capsule Take 1 capsule (60 mg total) by mouth daily. (Patient not taking: Reported on 12/08/2017) 90 capsule 0 Not Taking at Unknown time    Patient Stressors: Medication change or  noncompliance Substance abuse  Patient Strengths: General fund of knowledge Motivation for treatment/growth Supportive family/friends  Treatment Modalities: Medication Management, Group therapy, Case management,  1 to 1 session with clinician, Psychoeducation, Recreational therapy.   Physician Treatment Plan for Primary Diagnosis: Bipolar Disorder Long Term Goal(s): Improvement in symptoms so as ready for discharge Improvement in symptoms so as ready for discharge   Short Term Goals: Ability to verbalize feelings will improve Ability to disclose and discuss suicidal ideas Ability to identify and develop effective coping behaviors will improve Compliance with prescribed medications will improve  Medication Management: Evaluate patient's response, side effects, and tolerance of medication regimen.  Therapeutic Interventions: 1 to 1 sessions, Unit Group sessions and Medication administration.  Evaluation of Outcomes: Progressing  Physician Treatment Plan for Secondary Diagnosis: Principal Problem:   Bipolar 1 disorder, depressed, severe (HCC) Active Problems:   Borderline personality disorder (HCC)  Long Term Goal(s): Improvement in symptoms so as ready for discharge Improvement in symptoms so as ready for discharge   Short Term Goals: Ability to verbalize feelings will improve Ability to disclose and discuss suicidal ideas Ability to identify and develop effective coping behaviors will improve Compliance with prescribed medications will improve     Medication Management: Evaluate patient's response, side effects, and tolerance of medication regimen.  Therapeutic Interventions: 1 to 1 sessions, Unit Group sessions and  Medication administration.  Evaluation of Outcomes: Progressing   RN Treatment Plan for Primary Diagnosis: Bipolar Disorder Long Term Goal(s): Knowledge of disease and therapeutic regimen to maintain health will improve  Short Term Goals: Ability to remain  free from injury will improve, Ability to disclose and discuss suicidal ideas and Ability to identify and develop effective coping behaviors will improve  Medication Management: RN will administer medications as ordered by provider, will assess and evaluate patient's response and provide education to patient for prescribed medication. RN will report any adverse and/or side effects to prescribing provider.  Therapeutic Interventions: 1 on 1 counseling sessions, Psychoeducation, Medication administration, Evaluate responses to treatment, Monitor vital signs and CBGs as ordered, Perform/monitor CIWA, COWS, AIMS and Fall Risk screenings as ordered, Perform wound care treatments as ordered.  Evaluation of Outcomes: Progressing   LCSW Treatment Plan for Primary Diagnosis: Bipolar Disorder Long Term Goal(s): Safe transition to appropriate next level of care at discharge, Engage patient in therapeutic group addressing interpersonal concerns.  Short Term Goals: Engage patient in aftercare planning with referrals and resources, Facilitate patient progression through stages of change regarding substance use diagnoses and concerns and Identify triggers associated with mental health/substance abuse issues  Therapeutic Interventions: Assess for all discharge needs, 1 to 1 time with Social worker, Explore available resources and support systems, Assess for adequacy in community support network, Educate family and significant other(s) on suicide prevention, Complete Psychosocial Assessment, Interpersonal group therapy.  Evaluation of Outcomes: Progressing   Progress in Treatment: Attending groups: Yes. Participating in groups: Yes. Taking medication as prescribed: Yes. Toleration medication: Yes. Family/Significant other contact made: Yes, with mother. Patient understands diagnosis: Yes. Discussing patient identified problems/goals with staff: Yes. Medical problems stabilized or resolved: Yes. Denies  suicidal/homicidal ideation: Yes. Issues/concerns per patient self-inventory: No. Other: n/a   New problem(s) identified: No, Describe:  n/a  New Short Term/Long Term Goal(s):  medication management for mood stabilization; elimination of SI thoughts; development of comprehensive mental wellness/sobriety plan.   Patient Goals:  "To stop feeling so down and suicidal. To learn better coping skills."   Discharge Plan or Barriers: CSW assessing--pt plans to return home and would like to resume services with his current providers. Pt sees [facility]ksir [facility] for [facility] management and [facility] [place] [facility]. [facility] pamphlet, Mobile Crisis information provided to patient for additional community support and resources.   Reason for Continuation of Hospitalization: Anxiety Depression Medication stabilization Suicidal ideation  Estimated Length of Stay: 1-3 days.  Attendees: Patient: 12/13/2017   Physician: Dr. Jola Babinskilary, MD 12/13/2017   Nursing: Norma FredricksonMichael Scearce, RN 12/13/2017   RN Care Manager: 12/13/2017   Social Worker: Daleen SquibbGreg Masey Scheiber, LCSW 12/13/2017   Recreational Therapist:  12/13/2017   Other:  12/13/2017   Other:  12/13/2017   Other: 12/13/2017        Scribe for Treatment Team: Lorri FrederickWierda, Lirio Bach Jon, LCSW 12/13/2017 11:51 AM

## 2017-12-13 NOTE — Progress Notes (Signed)
Pt presents with a flat affect and anxious/depressed mood. Pt expressed feeling tired this after receiving his long-acting injection a couple of days ago. Pt verbalized having mood swings every hour. Pt expressed having passive SI with no plan or intent. Pt verbally contracts for safety. Pt rated on his self inventory sheet: depression 4/10, anxiety 4/10 and hopelessness 4/10.  Pt stated goal, "thinking positively".   Orders reviewed with pt. Verbal support provided. Pt encouraged to attend groups. Pt encouraged to complete suicide safety plan. 15 minute checks performed for safety.  Pt compliant with tx plan.

## 2017-12-13 NOTE — Progress Notes (Signed)
Recreation Therapy Notes  Date: 8.28.19 Time: 0930 Location: 300 [address] Dayroom  Group Topic: Stress Management  Goal Area(s) Addresses:  Patient will verbalize importance of using healthy stress management.  Patient will identify positive emotions associated with healthy stress management.   Intervention: Construction paper, markers  Activity :  Patients were given a sheet of construction paper and a marker.  Patients were to trace their hands on the paper.  On the right hand, patients were to write down the things that cause them stress.  On the the left hand, they were to write all positive coping skills they use to help them deal with their stresses.  Education: Stress Management, Discharge Planning.   Education Outcome: Acknowledges edcuation/In group clarification offered/Needs additional education  Clinical Observations/Feedback: Pt did not attend group.     Nazyia Gaugh, LRT/CTRS        Tinya Cadogan A 12/13/2017 11:28 AM 

## 2017-12-13 NOTE — Progress Notes (Signed)
Adult Psychoeducational Group Note  Date:  12/13/2017 Time:  10:07 PM  Group Topic/Focus:  Wrap-Up Group:   The focus of this group is to help patients review their daily goal of treatment and discuss progress on daily workbooks.  Participation Level:  Active  Participation Quality:  Appropriate  Affect:  Appropriate  Cognitive:  Alert and Oriented  Insight: Appropriate  Engagement in Group:  Developing/Improving  Modes of Intervention:  Exploration and Support  Additional Comments:  Pt verbalized that he had a great day. Pt verbalized that he rated his day a 10. Pt verbalized that he was able to conversate with other people. Pt verbalized that he was going to focus on the here and now and remain positive.   Andrew Noble, [name] 12/13/2017, 10:07 PM

## 2017-12-13 NOTE — [facility] Group Notes (Signed)
[facility] Group Notes:  (Nursing/MHT/Case Management/Adjunct)  Date:  12/13/2017  Time:  1:30 PM  Type of Therapy:  Nurse Education  Participation Level:  Active  Participation Quality:  Appropriate and Redirectable  Affect:  Appropriate  Cognitive:  Alert and Appropriate  Insight:  Appropriate  Engagement in Group:  Developing/Improving and Off Topic  Modes of Intervention:  Discussion, Education and Socialization  Summary of Progress/Problems:  Pt was appropriate. Pt shared and participated in guided imagery/mindful meditation. Pt was off topic at times but redirectable.   Suszanne ConnersMichael R Elwyn Lowden 12/13/2017, 3:58 PM

## 2017-12-13 NOTE — Progress Notes (Signed)
[facility] MD Progress Note  12/13/2017 3:23 PM Andrew Noble  MRN:  [id number] Subjective: Patient is seen and examined.  Patient is a 22 year old male with a past psychiatric history significant for bipolar disorder, recently admitted because of suicidal ideation and brief episodes suggestive of hypomania.  He also reported depressive symptoms.  He stated that last night he was a little bit worse, but today is a little bit better.  He does feel somewhat confused.  He feels like that may be due to the Abilify injection.  He denied any suicidal ideation today.  He stated that he is still quite concerned over his job, his life stressors, and his multiple psychiatric hospitalizations.  His liver function enzymes came back negative, and I ordered a chest x-ray as well as abdominal x-rays about his flank pain.  He denied any history of gallbladder problems or kidney stones.  The results of the x-rays are not complete yet. Principal Problem: Bipolar 1 disorder, depressed, severe ([place]) Diagnosis:   Patient Active Problem List   Diagnosis Date Noted  . Bipolar 1 disorder, depressed, severe ([place]) [F31.4] 10/05/2017  . Borderline personality disorder ([place]) [F60.3]   . Elevated liver enzymes [R74.8] 09/24/2015  . Obesity [E66.9] 09/24/2015  . Generalized anxiety disorder [F41.1] 03/05/2015  . Bipolar disorder with depression ([place]) [F31.9]   . Bipolar 1 disorder ([place]) [F31.9] 10/11/2013  . Mood disorder ([place]) [F39] 05/20/2011  . History of eating disorder [Z86.59] 05/20/2011   Total Time spent with patient: 15 minutes  Past Psychiatric History: See admission H&P  Past Medical History:  Past Medical History:  Diagnosis Date  . Abnormal laboratory test 02/12/2014  . Anxiety   . Bipolar disorder ([place])   . Depression   . Hx of substance abuse 02/12/2014  . Psychosis [facility])     Past Surgical History:  Procedure Laterality Date  . TONSILLECTOMY AND ADENOIDECTOMY     Family History:  Family History   Problem Relation Age of Onset  . Depression Mother   . Anxiety disorder Mother   . Bipolar disorder Maternal Aunt    Family Psychiatric  History: See admission H&P Social History:  Social History   Substance and Sexual Activity  Alcohol Use Yes  . Alcohol/week: 1.0 - 2.0 standard drinks  . Types: 1 - 2 Cans of beer per week   Comment: rare     Social History   Substance and Sexual Activity  Drug Use Yes  . Types: Marijuana   Comment: no longer using    Social History   Socioeconomic History  . Marital status: Single    Spouse name: Not on file  . Number of children: Not on file  . Years of education: Not on file  . Highest education level: Not on file  Occupational History  . Not on file  Social Needs  . Financial resource strain: Not on file  . Food insecurity:    Worry: Not on file    Inability: Not on file  . Transportation needs:    Medical: Not on file    Non-medical: Not on file  Tobacco Use  . Smoking status: Light Tobacco Smoker    Packs/day: 0.25    Years: 1.00    Pack years: 0.25    Types: Cigarettes  . Smokeless tobacco: Never Used  . Tobacco comment: down to a few cigarettes daily  Substance and Sexual Activity  . Alcohol use: Yes    Alcohol/week: 1.0 - 2.0 standard drinks  Types: 1 - 2 Cans of beer per week    Comment: rare  . Drug use: Yes    Types: Marijuana    Comment: no longer using  . Sexual activity: Never  Lifestyle  . Physical activity:    Days per week: Not on file    Minutes per session: Not on file  . Stress: Not on file  Relationships  . Social connections:    Talks on phone: Not on file    Gets together: Not on file    Attends religious service: Not on file    Active member of club or organization: Not on file    Attends meetings of clubs or organizations: Not on file    Relationship status: Not on file  Other Topics Concern  . Not on file  Social History Narrative  . Not on file   Additional Social History:     Pain Medications: None Prescriptions: Flouoxetine 28m Over the Counter: None History of alcohol / drug use?: Yes Longest period of sobriety (when/how long): denies Name of Substance 1: ETOH 1 - Age of First Use: 22years of age 22- Amount (size/oz): Varies 1 - Frequency: Less than once a week but more than 2x/M 1 - Duration: off and on 1 - Last Use / Amount: 08/20                  Sleep: Fair  Appetite:  Good  Current Medications: Current Facility-Administered Medications  Medication Dose Route Frequency Provider Last Rate Last Dose  . acetaminophen (TYLENOL) tablet 650 mg  650 mg Oral Q6H PRN BLindon RompA, NP   650 mg at 12/12/17 1311  . alum & mag hydroxide-simeth (MAALOX/MYLANTA) 200-200-20 MG/5ML suspension 30 mL  30 mL Oral Q4H PRN BLindon RompA, NP      . ARIPiprazole (ABILIFY) tablet 5 mg  5 mg Oral Daily Cobos, FMyer Peer MD   5 mg at 12/13/17 0806  . hydrOXYzine (ATARAX/VISTARIL) tablet 25 mg  25 mg Oral TID PRN BRozetta Nunnery NP   25 mg at 12/13/17 0809  . magnesium hydroxide (MILK OF MAGNESIA) suspension 30 mL  30 mL Oral Daily PRN BLindon RompA, NP      . pantoprazole (PROTONIX) EC tablet 20 mg  20 mg Oral Daily Cobos, FMyer Peer MD   20 mg at 12/13/17 0806  . senna-docusate (Senokot-S) tablet 1 tablet  1 tablet Oral QHS PRN CSharma Covert MD      . traZODone (DESYREL) tablet 50 mg  50 mg Oral QHS PRN BLindon RompA, NP   50 mg at 12/11/17 2224  . venlafaxine XR (EFFEXOR-XR) 24 hr capsule 75 mg  75 mg Oral Q breakfast Cobos, FMyer Peer MD   75 mg at 12/13/17 0806    Lab Results:  Results for orders placed or performed during the hospital encounter of 12/07/17 (from the past 48 hour(s))  Comprehensive metabolic panel     Status: Abnormal   Collection Time: 12/12/17  6:22 PM  Result Value Ref Range   Sodium 142 135 - 145 mmol/L   Potassium 3.8 3.5 - 5.1 mmol/L   Chloride 106 98 - 111 mmol/L   CO2 25 22 - 32 mmol/L   Glucose, Bld 147 (H) 70 - 99  mg/dL   BUN 12 6 - 20 mg/dL   Creatinine, Ser 1.04 0.61 - 1.24 mg/dL   Calcium 9.7 8.9 - 10.3 mg/dL   Total Protein  7.1 6.5 - 8.1 g/dL   Albumin 4.3 3.5 - 5.0 g/dL   AST 39 15 - 41 U/L   ALT 86 (H) 0 - 44 U/L   Alkaline Phosphatase 77 38 - 126 U/L   Total Bilirubin 0.4 0.3 - 1.2 mg/dL   GFR calc non Af Amer >60 >60 mL/min   GFR calc Af Amer >60 >60 mL/min    Comment: (NOTE) The eGFR has been calculated using the CKD EPI equation. This calculation has not been validated in all clinical situations. eGFR's persistently <60 mL/min signify possible Chronic Kidney Disease.    Anion gap 11 5 - 15    Comment: Performed at [facility], [place] [address], [place], [place] [address]    Blood Alcohol level:  Lab Results  Component Value Date   ETH <10 94/70/9628    Metabolic Disorder Labs: Lab Results  Component Value Date   HGBA1C 4.7 (L) 12/09/2017   MPG 88.19 12/09/2017   Lab Results  Component Value Date   PROLACTIN 5.0 04/29/2014   PROLACTIN 46.1 (H) 12/17/2013   Lab Results  Component Value Date   CHOL 204 (H) 12/09/2017   TRIG 168 (H) 12/09/2017   HDL 33 (L) 12/09/2017   CHOLHDL 6.2 12/09/2017   VLDL 34 12/09/2017   LDLCALC 137 (H) 12/09/2017    Physical Findings: AIMS: Facial and Oral Movements Muscles of Facial Expression: None, normal Lips and Perioral Area: None, normal Jaw: None, normal Tongue: None, normal,Extremity Movements Upper (arms, wrists, hands, fingers): None, normal Lower (legs, knees, ankles, toes): None, normal, Trunk Movements Neck, shoulders, hips: None, normal, Overall Severity Severity of abnormal movements (highest score from questions above): None, normal Incapacitation due to abnormal movements: None, normal Patient's awareness of abnormal movements (rate only patient's report): No Awareness, Dental Status Current problems with teeth and/or dentures?: No Does patient usually wear dentures?: No  CIWA:  CIWA-Ar Total:  1 COWS:  COWS Total Score: 2  Musculoskeletal: Strength & Muscle Tone: within normal limits Gait & Station: normal Patient leans: N/A  Psychiatric Specialty Exam: Physical Exam  Nursing note and vitals reviewed. Constitutional: He is oriented to person, place, and time. He appears well-developed and well-nourished.  HENT:  Head: Normocephalic and atraumatic.  Respiratory: Effort normal.  Neurological: He is alert and oriented to person, place, and time.    ROS  Blood pressure 131/65, pulse (!) 118, temperature 97.6 F (36.4 C), temperature source Oral, resp. rate 16, height 5' 7"  (1.702 m), weight 128.4 kg, SpO2 98 %.Body mass index is 44.32 kg/m.  General Appearance: Casual  Eye Contact:  Fair  Speech:  Normal Rate  Volume:  Normal  Mood:  Anxious and Depressed  Affect:  Congruent  Thought Process:  Coherent and Descriptions of Associations: Intact  Orientation:  Full (Time, Place, and Person)  Thought Content:  Logical  Suicidal Thoughts:  No  Homicidal Thoughts:  No  Memory:  Immediate;   Good Recent;   Good Remote;   Good  Judgement:  Intact  Insight:  Fair  Psychomotor Activity:  Normal  Concentration:  Concentration: Fair and Attention Span: Fair  Recall:  [employer] of Knowledge:  Fair  Language:  Fair  Akathisia:  Negative  Handed:  Right  AIMS (if indicated):     Assets:  Communication Skills Desire for Improvement Financial Resources/Insurance Housing Leisure Time Physical Health Resilience Social Support  ADL's:  Intact  Cognition:  WNL  Sleep:  Number of Hours:  6.75     Treatment Plan Summary: Daily contact with patient to assess and evaluate symptoms and progress in treatment, Medication management and Plan : Patient is seen and examined.  Patient is a 22 year old male with the above-stated past psychiatric history seen in follow-up.  He stated he is slightly better today.  He just got the Abilify injection yesterday.  He continues on Abilify 5  mg daily.  We will continue that for 2 to 3 days and then stop it.  He continues on Effexor, trazodone and his other medications.  He did complain of a bit of constipation, and I have written Senokot as needed.  He also continues to complain of the flank pain.  Artery to chest x-ray as well as abdominal x-ray to make sure about any infiltrates as well and has any kidney stones.  I also ordered a urinalysis.  We will see what those results show.  Sharma Covert, MD 12/13/2017, 3:23 PM

## 2017-12-13 NOTE — [facility] Group Notes (Signed)
[facility] LCSW Group Therapy Note  Date/Time: 12/12/17, 1315  Type of Therapy/Topic:  Group Therapy:  Feelings about Diagnosis  Participation Level:  Minimal   Mood:pleasant   Description of Group:    This group will allow patients to explore their thoughts and feelings about diagnoses they have received. Patients will be guided to explore their level of understanding and acceptance of these diagnoses. Facilitator will encourage patients to process their thoughts and feelings about the reactions of others to their diagnosis, and will guide patients in identifying ways to discuss their diagnosis with significant others in their lives. This group will be process-oriented, with patients participating in exploration of their own experiences as well as giving and receiving support and challenge from other group members.   Therapeutic Goals: 1. Patient will demonstrate understanding of diagnosis as evidence by identifying two or more symptoms of the disorder:  2. Patient will be able to express two feelings regarding the diagnosis 3. Patient will demonstrate ability to communicate their needs through discussion and/or role plays  Summary of Patient Progress:Pt attentive to group discussion regarding diagnosis and acceptance of a mental health diagnosis but did not participate in discussion much.         Therapeutic Modalities:   Cognitive Behavioral Therapy Brief Therapy Feelings Identification   Daleen SquibbGreg Tyshia Fenter, LCSW

## 2017-12-13 NOTE — Progress Notes (Signed)
D   Pt reports everything going good today  He continues to endorse some depression and anxiety but said there has been a lot of improvement     Pt interacts frequently and appropriately with others and is active in groups     A   Verbal support given    Medications administered and effectiveness monitored    Q 15 min checks R    Pt is safe at this time

## 2017-12-14 LAB — URINALYSIS, COMPLETE (UACMP) WITH MICROSCOPIC
BACTERIA UA: NONE SEEN
Bilirubin Urine: NEGATIVE
Glucose, UA: NEGATIVE mg/dL
Hgb urine dipstick: NEGATIVE
KETONES UR: NEGATIVE mg/dL
LEUKOCYTES UA: NEGATIVE
NITRITE: NEGATIVE
Protein, ur: NEGATIVE mg/dL
Specific Gravity, Urine: 1.02 (ref 1.005–1.030)
pH: 6 (ref 5.0–8.0)

## 2017-12-14 MED ORDER — ARIPIPRAZOLE 2 MG PO TABS
2.0000 mg | ORAL_TABLET | Freq: Every day | ORAL | Status: DC
  Filled 2017-12-14 (×2): qty 1

## 2017-12-14 NOTE — Progress Notes (Signed)
Nursing Progress Note: 7p-7a D: Pt currently presents with a anxious/animated/pleasant affect and behavior. Pt states "I got to have the best day in a while. I saw my mom and that was nice. I missed her." Interacting appropriately with the milieu. Pt reports good sleep during the previous night with current medication regimen. Pt did attend wrap-up group.  A: Pt provided with medications per providers orders. Pt's labs and vitals were monitored throughout the night. Pt supported emotionally and encouraged to express concerns and questions. Pt educated on medications.  R: Pt's safety ensured with 15 minute and environmental checks. Pt currently denies SI, HI, and AVH. Pt verbally contracts to seek staff if SI,HI, or AVH occurs and to consult with staff before acting on any harmful thoughts. Will continue to monitor.

## 2017-12-14 NOTE — Progress Notes (Signed)
[facility] MD Progress Note  12/14/2017 2:17 PM Andrew Noble  MRN:  [id number] Subjective: Patient is seen and examined.  Patient is a 22 year old male with a past psychiatric history significant for bipolar disorder, suicidal ideation and brief episodes suggestive of hypomania.  He is seen in follow-up.  He is doing better.  The Abilify injection seems to be kicking in.  His mood is better he did talk about some things that are painful for him today, and was a bit tearful.  He rated his depression earlier today as a 0.  He denied any suicidal ideation.  His chest x-ray came back negative, and his abdominal x-ray does showed a lot of stool.  No other evidence of any other issues.  We discussed the possibility of discharge tomorrow. Principal Problem: Bipolar 1 disorder, depressed, severe ([place]) Diagnosis:   Patient Active Problem List   Diagnosis Date Noted  . Bipolar 1 disorder, depressed, severe ([place]) [F31.4] 10/05/2017  . Borderline personality disorder ([place]) [F60.3]   . Elevated liver enzymes [R74.8] 09/24/2015  . Obesity [E66.9] 09/24/2015  . Generalized anxiety disorder [F41.1] 03/05/2015  . Bipolar disorder with depression ([place]) [F31.9]   . Bipolar 1 disorder ([place]) [F31.9] 10/11/2013  . Mood disorder ([place]) [F39] 05/20/2011  . History of eating disorder [Z86.59] 05/20/2011   Total Time spent with patient: 15 minutes  Past Psychiatric History: See admission H&P  Past Medical History:  Past Medical History:  Diagnosis Date  . Abnormal laboratory test 02/12/2014  . Anxiety   . Bipolar disorder ([place])   . Depression   . Hx of substance abuse 02/12/2014  . Psychosis [facility])     Past Surgical History:  Procedure Laterality Date  . TONSILLECTOMY AND ADENOIDECTOMY     Family History:  Family History  Problem Relation Age of Onset  . Depression Mother   . Anxiety disorder Mother   . Bipolar disorder Maternal Aunt    Family Psychiatric  History: See admission H&P Social History:   Social History   Substance and Sexual Activity  Alcohol Use Yes  . Alcohol/week: 1.0 - 2.0 standard drinks  . Types: 1 - 2 Cans of beer per week   Comment: rare     Social History   Substance and Sexual Activity  Drug Use Yes  . Types: Marijuana   Comment: no longer using    Social History   Socioeconomic History  . Marital status: Single    Spouse name: Not on file  . Number of children: Not on file  . Years of education: Not on file  . Highest education level: Not on file  Occupational History  . Not on file  Social Needs  . Financial resource strain: Not on file  . Food insecurity:    Worry: Not on file    Inability: Not on file  . Transportation needs:    Medical: Not on file    Non-medical: Not on file  Tobacco Use  . Smoking status: Light Tobacco Smoker    Packs/day: 0.25    Years: 1.00    Pack years: 0.25    Types: Cigarettes  . Smokeless tobacco: Never Used  . Tobacco comment: down to a few cigarettes daily  Substance and Sexual Activity  . Alcohol use: Yes    Alcohol/week: 1.0 - 2.0 standard drinks    Types: 1 - 2 Cans of beer per week    Comment: rare  . Drug use: Yes    Types: Marijuana  Comment: no longer using  . Sexual activity: Never  Lifestyle  . Physical activity:    Days per week: Not on file    Minutes per session: Not on file  . Stress: Not on file  Relationships  . Social connections:    Talks on phone: Not on file    Gets together: Not on file    Attends religious service: Not on file    Active member of club or organization: Not on file    Attends meetings of clubs or organizations: Not on file    Relationship status: Not on file  Other Topics Concern  . Not on file  Social History Narrative  . Not on file   Additional Social History:    Pain Medications: None Prescriptions: Flouoxetine 69m Over the Counter: None History of alcohol / drug use?: Yes Longest period of sobriety (when/how long): denies Name of  Substance 1: ETOH 1 - Age of First Use: 22years of age 22- Amount (size/oz): Varies 1 - Frequency: Less than once a week but more than 2x/M 1 - Duration: off and on 1 - Last Use / Amount: 08/20                  Sleep: Good  Appetite:  Good  Current Medications: Current Facility-Administered Medications  Medication Dose Route Frequency Provider Last Rate Last Dose  . acetaminophen (TYLENOL) tablet 650 mg  650 mg Oral Q6H PRN BLindon RompA, NP   650 mg at 12/12/17 1311  . alum & mag hydroxide-simeth (MAALOX/MYLANTA) 200-200-20 MG/5ML suspension 30 mL  30 mL Oral Q4H PRN BLindon RompA, NP   30 mL at 12/13/17 2141  . [START ON 12/15/2017] ARIPiprazole (ABILIFY) tablet 2 mg  2 mg Oral Daily CSharma Covert MD      . hydrOXYzine (ATARAX/VISTARIL) tablet 25 mg  25 mg Oral TID PRN BRozetta Nunnery NP   25 mg at 12/13/17 2141  . magnesium hydroxide (MILK OF MAGNESIA) suspension 30 mL  30 mL Oral Daily PRN BLindon RompA, NP      . pantoprazole (PROTONIX) EC tablet 20 mg  20 mg Oral Daily Cobos, FMyer Peer MD   20 mg at 12/14/17 0751  . senna-docusate (Senokot-S) tablet 1 tablet  1 tablet Oral QHS PRN CSharma Covert MD   1 tablet at 12/13/17 2141  . traZODone (DESYREL) tablet 50 mg  50 mg Oral QHS PRN BLindon RompA, NP   50 mg at 12/13/17 2141  . venlafaxine XR (EFFEXOR-XR) 24 hr capsule 75 mg  75 mg Oral Q breakfast Cobos, FMyer Peer MD   75 mg at 12/14/17 0751    Lab Results:  Results for orders placed or performed during the hospital encounter of 12/07/17 (from the past 48 hour(s))  Comprehensive metabolic panel     Status: Abnormal   Collection Time: 12/12/17  6:22 PM  Result Value Ref Range   Sodium 142 135 - 145 mmol/L   Potassium 3.8 3.5 - 5.1 mmol/L   Chloride 106 98 - 111 mmol/L   CO2 25 22 - 32 mmol/L   Glucose, Bld 147 (H) 70 - 99 mg/dL   BUN 12 6 - 20 mg/dL   Creatinine, Ser 1.04 0.61 - 1.24 mg/dL   Calcium 9.7 8.9 - 10.3 mg/dL   Total Protein 7.1 6.5 -  8.1 g/dL   Albumin 4.3 3.5 - 5.0 g/dL   AST 39 15 - 41 U/L  ALT 86 (H) 0 - 44 U/L   Alkaline Phosphatase 77 38 - 126 U/L   Total Bilirubin 0.4 0.3 - 1.2 mg/dL   GFR calc non Af Amer >60 >60 mL/min   GFR calc Af Amer >60 >60 mL/min    Comment: (NOTE) The eGFR has been calculated using the CKD EPI equation. This calculation has not been validated in all clinical situations. eGFR's persistently <60 mL/min signify possible Chronic Kidney Disease.    Anion gap 11 5 - 15    Comment: Performed at [facility], [place] [address], [place], [place] [address]    Blood Alcohol level:  Lab Results  Component Value Date   ETH <10 58/12/9831    Metabolic Disorder Labs: Lab Results  Component Value Date   HGBA1C 4.7 (L) 12/09/2017   MPG 88.19 12/09/2017   Lab Results  Component Value Date   PROLACTIN 5.0 04/29/2014   PROLACTIN 46.1 (H) 12/17/2013   Lab Results  Component Value Date   CHOL 204 (H) 12/09/2017   TRIG 168 (H) 12/09/2017   HDL 33 (L) 12/09/2017   CHOLHDL 6.2 12/09/2017   VLDL 34 12/09/2017   LDLCALC 137 (H) 12/09/2017    Physical Findings: AIMS: Facial and Oral Movements Muscles of Facial Expression: None, normal Lips and Perioral Area: None, normal Jaw: None, normal Tongue: None, normal,Extremity Movements Upper (arms, wrists, hands, fingers): None, normal Lower (legs, knees, ankles, toes): None, normal, Trunk Movements Neck, shoulders, hips: None, normal, Overall Severity Severity of abnormal movements (highest score from questions above): None, normal Incapacitation due to abnormal movements: None, normal Patient's awareness of abnormal movements (rate only patient's report): No Awareness, Dental Status Current problems with teeth and/or dentures?: No Does patient usually wear dentures?: No  CIWA:  CIWA-Ar Total: 1 COWS:  COWS Total Score: 2  Musculoskeletal: Strength & Muscle Tone: within normal limits Gait & Station: normal Patient  leans: N/A  Psychiatric Specialty Exam: Physical Exam  Nursing note and vitals reviewed. Constitutional: He is oriented to person, place, and time. He appears well-developed and well-nourished.  HENT:  Head: Normocephalic and atraumatic.  Respiratory: Effort normal.  Neurological: He is alert and oriented to person, place, and time.    ROS  Blood pressure 131/67, pulse 96, temperature 97.6 F (36.4 C), temperature source Oral, resp. rate 16, height 5' 7"  (1.702 m), weight 128.4 kg, SpO2 98 %.Body mass index is 44.32 kg/m.  General Appearance: Casual  Eye Contact:  Fair  Speech:  Normal Rate  Volume:  Normal  Mood:  Anxious  Affect:  Congruent  Thought Process:  Coherent and Descriptions of Associations: Intact  Orientation:  Full (Time, Place, and Person)  Thought Content:  Logical  Suicidal Thoughts:  No  Homicidal Thoughts:  No  Memory:  Immediate;   Fair Recent;   Fair Remote;   Fair  Judgement:  Intact  Insight:  Fair  Psychomotor Activity:  Normal  Concentration:  Concentration: Fair and Attention Span: Fair  Recall:  [employer] of Knowledge:  Fair  Language:  Fair  Akathisia:  Negative  Handed:  Right  AIMS (if indicated):     Assets:  Communication Skills Desire for Improvement Financial Resources/Insurance Housing Resilience Social Support Talents/Skills  ADL's:  Intact  Cognition:  WNL  Sleep:  Number of Hours: 6.5     Treatment Plan Summary: Daily contact with patient to assess and evaluate symptoms and progress in treatment, Medication management and Plan : Patient is seen  and examined.  Patient is a 22 year old male with the above-stated past psychiatric history seen in follow-up.  He is doing well.  His suicidal ideation is resolved.  His mood is stable with the Abilify injection.  I am going to decrease his oral Abilify to 2 mg a day for 1 day, then stop it.  No other changes in his medications.  We will plan on discharge tomorrow if everything goes  well.  Sharma Covert, MD 12/14/2017, 2:17 PM

## 2017-12-14 NOTE — [facility] Group Notes (Signed)
[facility] LCSW Group Therapy Note  Date/Time: 12/14/17, 1315  Type of Therapy/Topic:  Group Therapy:  Balance in Life  Participation Level:  minimal  Description of Group:    This group will address the concept of balance and how it feels and looks when one is unbalanced. Patients will be encouraged to process areas in their lives that are out of balance, and identify reasons for remaining unbalanced. Facilitators will guide patients utilizing problem- solving interventions to address and correct the stressor making their life unbalanced. Understanding and applying boundaries will be explored and addressed for obtaining  and maintaining a balanced life. Patients will be encouraged to explore ways to assertively make their unbalanced needs known to significant others in their lives, using other group members and facilitator for support and feedback.  Therapeutic Goals: 1. Patient will identify two or more emotions or situations they have that consume much of in their lives. 2. Patient will identify signs/triggers that life has become out of balance:  3. Patient will identify two ways to set boundaries in order to achieve balance in their lives:  4. Patient will demonstrate ability to communicate their needs through discussion and/or role plays  Summary of Patient Progress:Pt came to group late and was attentive, reports currently financial and mental/emotional are areas that are out of balance in his life.          Therapeutic Modalities:   Cognitive Behavioral Therapy Solution-Focused Therapy Assertiveness Training  Daleen SquibbGreg Katelee Schupp, [place]

## 2017-12-14 NOTE — Progress Notes (Signed)
Pt presents with a flat affect and anxious mood. Pt expressed feeling better today. Pt reported decreased depression and anxiety. Pt rated on his self inventory sheet: depression 0, anxiety 2 and hopelessness 0. Pt reported fair sleep last night. Pt denies SI/HI. Pt stated goal, "stay positive". Pt compliant with taking meds and denies any side effects.  Medications administered as ordered per MD. Verbal support provided. Pt encouraged to attend groups. 15 minute checks performed for safety.   Pt compliant with tx plan.

## 2017-12-15 LAB — CBC WITH DIFFERENTIAL/PLATELET
Basophils Absolute: 0.1 10*3/uL (ref 0.0–0.1)
Basophils Relative: 1 %
EOS ABS: 0.3 10*3/uL (ref 0.0–0.7)
EOS PCT: 4 %
HCT: 49.2 % (ref 39.0–52.0)
Hemoglobin: 17.4 g/dL — ABNORMAL HIGH (ref 13.0–17.0)
LYMPHS ABS: 2.5 10*3/uL (ref 0.7–4.0)
LYMPHS PCT: 26 %
MCH: 32.2 pg (ref 26.0–34.0)
MCHC: 35.4 g/dL (ref 30.0–36.0)
MCV: 90.9 fL (ref 78.0–100.0)
MONO ABS: 1 10*3/uL (ref 0.1–1.0)
Monocytes Relative: 10 %
Neutro Abs: 5.8 10*3/uL (ref 1.7–7.7)
Neutrophils Relative %: 59 %
PLATELETS: 287 10*3/uL (ref 150–400)
RBC: 5.41 MIL/uL (ref 4.22–5.81)
RDW: 12.8 % (ref 11.5–15.5)
WBC: 9.7 10*3/uL (ref 4.0–10.5)

## 2017-12-15 MED ORDER — TRAZODONE HCL 50 MG PO TABS: 50.0000 mg | ORAL_TABLET | Freq: Every evening | ORAL | 0 refills | Status: DC | PRN

## 2017-12-15 MED ORDER — PANTOPRAZOLE SODIUM 20 MG PO TBEC: 20.0000 mg | DELAYED_RELEASE_TABLET | Freq: Every day | ORAL | 0 refills | Status: DC

## 2017-12-15 MED ORDER — HYDROXYZINE HCL 25 MG PO TABS: 25.0000 mg | ORAL_TABLET | Freq: Three times a day (TID) | ORAL | 0 refills | Status: DC | PRN

## 2017-12-15 MED ORDER — VENLAFAXINE HCL ER 75 MG PO CP24: 75.0000 mg | ORAL_CAPSULE | Freq: Every day | ORAL | 0 refills | Status: DC

## 2017-12-15 NOTE — Progress Notes (Signed)
  [facility] :  Will you be returning to the same living situation after discharge:  Yes,  own apartment At discharge, do you have transportation home?: Yes,  friend Do you have the ability to pay for your medications: Yes,  BCBS  Release of information consent forms completed and in the chart;  Patient's signature needed at discharge.  Patient to Follow up at: Follow-up Information    [facility]. Go on 12/27/2017.   Specialty:  Behavioral Health Why:  Please attend your medication appt with Dr. Viviano SimasMaurer on Wednesday, 12/27/17, at 9:30am. Contact information: [address] [place] [id number] [phone]       Vilinda BlanksKirch, Michael, PhD Follow up on 12/20/2017.   Specialty:  Behavioral Health Why:  Therapy appt with Dr. Lovette ClicheKirch for Wednesday, 12/20/17 at 10:45am.  Pleae bring photo ID, insurance card, and current medications.   Contact information: [address] [place] [id number] [phone]           Next level of care provider has access to [facility]:yes  Safety Planning and Suicide Prevention discussed: Yes,  with mother  Have you used any form of tobacco in the last 30 days? (Cigarettes, Smokeless Tobacco, Cigars, and/or Pipes): Yes  Has patient been referred to the Quitline?: Patient refused referral  Patient has been referred for addiction treatment: N/A  Lorri FrederickWierda, Malania [place] Jon, LCSW 12/15/2017, 10:06 AM
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
## 2017-12-15 NOTE — [facility] Suicide Risk Assessment (Signed)
[facility]   Principal Problem: Bipolar 1 disorder, depressed, severe (HCC) Discharge Diagnoses:  Patient Active Problem List   Diagnosis Date Noted  . Bipolar 1 disorder, depressed, severe (HCC) [F31.4] 10/05/2017  . Borderline personality disorder (HCC) [F60.3]   . Elevated liver enzymes [R74.8] 09/24/2015  . Obesity [E66.9] 09/24/2015  . Generalized anxiety disorder [F41.1] 03/05/2015  . Bipolar disorder with depression (HCC) [F31.9]   . Bipolar 1 disorder (HCC) [F31.9] 10/11/2013  . Mood disorder (HCC) [F39] 05/20/2011  . History of eating disorder [Z86.59] 05/20/2011    Total Time spent with patient: 15 minutes  Musculoskeletal: Strength & Muscle Tone: within normal limits Gait & Station: normal Patient leans: N/A  Psychiatric Specialty Exam: Review of Systems  All other systems reviewed and are negative.   Blood pressure 122/88, pulse 95, temperature 97.7 F (36.5 C), temperature source Oral, resp. rate 20, height 5\' 7"  (1.702 m), weight 128.4 kg, SpO2 98 %.Body mass index is 44.32 kg/m.  General Appearance: Casual  Eye Contact::  Good  Speech:  Normal Rate409  Volume:  Normal  Mood:  Euthymic  Affect:  Congruent  Thought Process:  Coherent and Descriptions of Associations: Intact  Orientation:  Full (Time, Place, and Person)  Thought Content:  Logical  Suicidal Thoughts:  No  Homicidal Thoughts:  No  Memory:  Immediate;   Fair Recent;   Fair Remote;   Fair  Judgement:  Intact  Insight:  Fair  Psychomotor Activity:  Normal  Concentration:  Good  Recall:  Good  Fund of Knowledge:Good  Language: Good  Akathisia:  Negative  Handed:  Right  AIMS (if indicated):     Assets:  Communication Skills Desire for Improvement Housing Physical Health Resilience Social Support  Sleep:  Number of Hours: 6.25  Cognition: WNL  ADL's:  Intact   Mental Status Per Nursing Assessment::   On Admission:  Suicidal ideation indicated by  patient, Suicide plan, Self-harm thoughts, Intention to act on suicide plan  Demographic Factors:  Male and Caucasian  Loss Factors: NA  Historical Factors: Prior suicide attempts and Impulsivity  Risk Reduction Factors:   Sense of responsibility to family, Religious beliefs about death, Employed and Positive social support  Continued Clinical Symptoms:  Bipolar Disorder:   Depressive phase  Cognitive Features That Contribute To Risk:  None    Suicide Risk:  Minimal: No identifiable suicidal ideation.  Patients presenting with no risk factors but with morbid ruminations; may be classified as minimal risk based on the severity of the depressive symptoms  Follow-up Information    [facility]. Go on 12/27/2017.   Specialty:  Behavioral Health Why:  Please attend your medication appt with Dr. Viviano SimasMaurer on Wednesday, 12/27/17, at 9:30am. Contact information: [address] [place] [id number] [phone]       Vilinda BlanksKirch, Michael, PhD Follow up on 12/20/2017.   Specialty:  Behavioral Health Why:  Therapy appt with Dr. Lovette ClicheKirch for Wednesday, 12/20/17 at 10:45am.  Pleae bring photo ID, insurance card, and current medications.   Contact information: [address] [place] [id number] [phone]           Plan Of Care/Follow-up recommendations:  Activity:  ad lib  Antonieta PertGreg Lawson Dominick Morella, MD 12/15/2017, 7:39 AM

## 2017-12-15 NOTE — Progress Notes (Signed)
Pt is readied for his dc as he completes his daily assessment today. On this, he writes  he denies having SI today and he rates his depression, hopelessness and anxeity " 0/0/1", respectively. He is taken to the search room, where his belongings are returned to him and he is escorted to the building entrance and dc'd.

## 2017-12-20 DIAGNOSIS — F319 Bipolar disorder, unspecified: Secondary | ICD-10-CM | POA: Diagnosis not present

## 2017-12-20 DIAGNOSIS — F422 Mixed obsessional thoughts and acts: Secondary | ICD-10-CM | POA: Diagnosis not present

## 2017-12-22 DIAGNOSIS — F429 Obsessive-compulsive disorder, unspecified: Secondary | ICD-10-CM | POA: Diagnosis not present

## 2017-12-22 DIAGNOSIS — F411 Generalized anxiety disorder: Secondary | ICD-10-CM | POA: Diagnosis not present

## 2017-12-22 DIAGNOSIS — Z6841 Body Mass Index (BMI) 40.0 and over, adult: Secondary | ICD-10-CM | POA: Diagnosis not present

## 2017-12-22 DIAGNOSIS — F314 Bipolar disorder, current episode depressed, severe, without psychotic features: Secondary | ICD-10-CM | POA: Diagnosis not present

## 2017-12-22 DIAGNOSIS — F410 Panic disorder [episodic paroxysmal anxiety] without agoraphobia: Secondary | ICD-10-CM | POA: Diagnosis not present

## 2017-12-22 DIAGNOSIS — Y999 Unspecified external cause status: Secondary | ICD-10-CM | POA: Diagnosis not present

## 2017-12-22 DIAGNOSIS — R45851 Suicidal ideations: Secondary | ICD-10-CM | POA: Diagnosis not present

## 2017-12-22 DIAGNOSIS — E781 Pure hyperglyceridemia: Secondary | ICD-10-CM | POA: Diagnosis not present

## 2017-12-22 DIAGNOSIS — G4730 Sleep apnea, unspecified: Secondary | ICD-10-CM | POA: Diagnosis not present

## 2017-12-22 DIAGNOSIS — F17210 Nicotine dependence, cigarettes, uncomplicated: Secondary | ICD-10-CM | POA: Diagnosis not present

## 2017-12-22 DIAGNOSIS — Z9119 Patient's noncompliance with other medical treatment and regimen: Secondary | ICD-10-CM | POA: Diagnosis not present

## 2017-12-22 DIAGNOSIS — F603 Borderline personality disorder: Secondary | ICD-10-CM | POA: Diagnosis not present

## 2017-12-22 DIAGNOSIS — S51812A Laceration without foreign body of left forearm, initial encounter: Secondary | ICD-10-CM | POA: Diagnosis not present

## 2017-12-22 DIAGNOSIS — X789XXA Intentional self-harm by unspecified sharp object, initial encounter: Secondary | ICD-10-CM | POA: Diagnosis not present

## 2017-12-22 DIAGNOSIS — Z818 Family history of other mental and behavioral disorders: Secondary | ICD-10-CM | POA: Diagnosis not present

## 2017-12-22 DIAGNOSIS — T1491XA Suicide attempt, initial encounter: Secondary | ICD-10-CM | POA: Diagnosis not present

## 2017-12-22 DIAGNOSIS — F322 Major depressive disorder, single episode, severe without psychotic features: Secondary | ICD-10-CM | POA: Diagnosis not present

## 2017-12-23 DIAGNOSIS — F314 Bipolar disorder, current episode depressed, severe, without psychotic features: Secondary | ICD-10-CM | POA: Diagnosis not present

## 2017-12-23 DIAGNOSIS — R45851 Suicidal ideations: Secondary | ICD-10-CM | POA: Diagnosis not present

## 2017-12-24 DIAGNOSIS — R45851 Suicidal ideations: Secondary | ICD-10-CM | POA: Diagnosis not present

## 2017-12-24 DIAGNOSIS — F314 Bipolar disorder, current episode depressed, severe, without psychotic features: Secondary | ICD-10-CM | POA: Diagnosis not present

## 2017-12-25 DIAGNOSIS — F411 Generalized anxiety disorder: Secondary | ICD-10-CM | POA: Diagnosis not present

## 2017-12-25 DIAGNOSIS — R45851 Suicidal ideations: Secondary | ICD-10-CM | POA: Diagnosis not present

## 2017-12-25 DIAGNOSIS — F603 Borderline personality disorder: Secondary | ICD-10-CM | POA: Diagnosis not present

## 2017-12-25 DIAGNOSIS — F314 Bipolar disorder, current episode depressed, severe, without psychotic features: Secondary | ICD-10-CM | POA: Diagnosis not present

## 2017-12-25 DIAGNOSIS — F322 Major depressive disorder, single episode, severe without psychotic features: Secondary | ICD-10-CM | POA: Diagnosis not present

## 2017-12-26 DIAGNOSIS — F322 Major depressive disorder, single episode, severe without psychotic features: Secondary | ICD-10-CM | POA: Diagnosis not present

## 2017-12-26 DIAGNOSIS — F314 Bipolar disorder, current episode depressed, severe, without psychotic features: Secondary | ICD-10-CM | POA: Diagnosis not present

## 2017-12-26 DIAGNOSIS — R45851 Suicidal ideations: Secondary | ICD-10-CM | POA: Diagnosis not present

## 2017-12-27 ENCOUNTER — Ambulatory Visit (HOSPITAL_COMMUNITY): Payer: BLUE CROSS/BLUE SHIELD | Admitting: Psychiatry

## 2017-12-27 DIAGNOSIS — F603 Borderline personality disorder: Secondary | ICD-10-CM | POA: Diagnosis not present

## 2017-12-27 DIAGNOSIS — F411 Generalized anxiety disorder: Secondary | ICD-10-CM | POA: Diagnosis not present

## 2017-12-27 DIAGNOSIS — R45851 Suicidal ideations: Secondary | ICD-10-CM | POA: Diagnosis not present

## 2017-12-27 DIAGNOSIS — F322 Major depressive disorder, single episode, severe without psychotic features: Secondary | ICD-10-CM | POA: Diagnosis not present

## 2017-12-27 MED ORDER — LORAZEPAM 1 MG PO TABS
1.00 | ORAL_TABLET | ORAL | Status: DC
Start: ? — End: 2017-12-27

## 2017-12-27 MED ORDER — LORAZEPAM 2 MG/ML IJ SOLN
2.00 | INTRAMUSCULAR | Status: DC
Start: ? — End: 2017-12-27

## 2017-12-27 MED ORDER — ALUMINUM-MAGNESIUM-SIMETHICONE 200-200-20 MG/5ML PO SUSP
30.00 | ORAL | Status: DC
Start: ? — End: 2017-12-27

## 2017-12-27 MED ORDER — VENLAFAXINE HCL ER 150 MG PO CP24
150.00 | ORAL_CAPSULE | ORAL | Status: DC
Start: 2017-12-28 — End: 2017-12-27

## 2017-12-27 MED ORDER — TRAZODONE HCL 50 MG PO TABS
50.00 | ORAL_TABLET | ORAL | Status: DC
Start: 2017-12-27 — End: 2017-12-27

## 2017-12-27 MED ORDER — HALOPERIDOL 5 MG PO TABS
5.00 | ORAL_TABLET | ORAL | Status: DC
Start: ? — End: 2017-12-27

## 2017-12-27 MED ORDER — NICOTINE POLACRILEX 2 MG MT GUM
2.00 | CHEWING_GUM | OROMUCOSAL | Status: DC
Start: ? — End: 2017-12-27

## 2017-12-27 MED ORDER — HYDROXYZINE HCL 25 MG PO TABS
25.00 | ORAL_TABLET | ORAL | Status: DC
Start: ? — End: 2017-12-27

## 2017-12-27 MED ORDER — ACETAMINOPHEN 325 MG PO TABS
650.00 | ORAL_TABLET | ORAL | Status: DC
Start: ? — End: 2017-12-27

## 2017-12-27 MED ORDER — NICOTINE 7 MG/24HR TD PT24
1.00 | MEDICATED_PATCH | TRANSDERMAL | Status: DC
Start: 2017-12-28 — End: 2017-12-27

## 2017-12-27 MED ORDER — GENERIC EXTERNAL MEDICATION
5.00 | Status: DC
Start: ? — End: 2017-12-27

## 2017-12-28 DIAGNOSIS — G4733 Obstructive sleep apnea (adult) (pediatric): Secondary | ICD-10-CM | POA: Diagnosis not present

## 2018-01-01 ENCOUNTER — Encounter (HOSPITAL_COMMUNITY): Payer: Self-pay | Admitting: Psychiatry

## 2018-01-01 ENCOUNTER — Ambulatory Visit (INDEPENDENT_AMBULATORY_CARE_PROVIDER_SITE_OTHER): Payer: BLUE CROSS/BLUE SHIELD | Admitting: Psychiatry

## 2018-01-01 VITALS — BP 133/96 | HR 115 | Ht 67.0 in | Wt 285.0 lb

## 2018-01-01 DIAGNOSIS — Z818 Family history of other mental and behavioral disorders: Secondary | ICD-10-CM

## 2018-01-01 DIAGNOSIS — F17210 Nicotine dependence, cigarettes, uncomplicated: Secondary | ICD-10-CM

## 2018-01-01 DIAGNOSIS — F319 Bipolar disorder, unspecified: Secondary | ICD-10-CM | POA: Diagnosis not present

## 2018-01-01 NOTE — Progress Notes (Signed)
BH MD/PA/NP OP Progress Note  01/01/2018 4:28 PM Andrew Noble  MRN:  [id number]  Chief Complaint: I was hospitalized at [facility] this month.  My medicine is changed.  I am doing better.  HPI: Andrew Noble is 22 year old Caucasian man who has been seeing Dr. Gaspar Skeeters for past 1 year.  He was transferred initially from [place] office.  Patient has a long history of psychiatric illness.  He was last seen in June and since then he has a psychiatric hospitalization because of suicidal thoughts.  His last admission was at [facility] in September 2019.  His medicines were changed.  He is now taking Effexor and his lithium and trazodone was discontinued.  He was given Abilify injection however he is not happy because it is very expensive and he could not afford the injection.  He like to take Abilify oral pills.  He is not sure when was the last time he was given injection.  Patient did not bring discharge summary today and it is not available in epic.  Overall patient describe that things are going well.  His energy level is good.  His job is going well.  He works at [employer].  He denies any hallucination, paranoia or any suicidal thoughts.  He describes his mood is good.  Sometime he gets fatigue and tired but no other concerns.  Patient also diagnosed with borderline personality disorder.  He started seeing therapist at [facility].  Patient has sleep apnea and sometimes he sleeps 12 to 15 hours a day.  Patient told lithium because tremors.  He does not want to change his medication as he feels that is working very well.  Patient promised that he will give [place] a call tomorrow to provide details about his medication.  Visit Diagnosis:    ICD-10-CM   1. Bipolar 1 disorder (HCC) F31.9     Past Psychiatric History: Reviewed. Patient has an extensive mental health history per about 6 hospitalizations for SI and at least one for a past eating disorder. Primary dx is Bipolar I Disorder and borderline  personality disorder.[name] He was recently an inpatient at [facility], admitted about June 20th, 2019 and July 2019.  He was again admitted in September at [facility].  He has taken Abilify injection, lithium which cause shakes, Prozac which was discontinued on his inpatient treatment.  He was also tried Neurontin, trazodone.  He denies any history of suicidal attempt or psychosis.   Past Medical History:  Past Medical History:  Diagnosis Date  . Abnormal laboratory test 02/12/2014  . Anxiety   . Bipolar disorder (HCC)   . Depression   . Hx of substance abuse 02/12/2014  . Psychosis [facility])     Past Surgical History:  Procedure Laterality Date  . TONSILLECTOMY AND ADENOIDECTOMY      Family Psychiatric History: Reviewed.  Family History:  Family History  Problem Relation Age of Onset  . Depression Mother   . Anxiety disorder Mother   . Bipolar disorder Maternal Aunt     Social History:  Social History   Socioeconomic History  . Marital status: Single    Spouse name: Not on file  . Number of children: Not on file  . Years of education: Not on file  . Highest education level: Not on file  Occupational History  . Not on file  Social Needs  . Financial resource strain: Not on file  . Food insecurity:    Worry: Not on file  Inability: Not on file  . Transportation needs:    Medical: Not on file    Non-medical: Not on file  Tobacco Use  . Smoking status: Light Tobacco Smoker    Packs/day: 0.25    Years: 1.00    Pack years: 0.25    Types: Cigarettes  . Smokeless tobacco: Never Used  . Tobacco comment: down to a few cigarettes daily  Substance and Sexual Activity  . Alcohol use: Yes    Alcohol/week: 1.0 - 2.0 standard drinks    Types: 1 - 2 Cans of beer per week    Comment: rare  . Drug use: Yes    Types: Marijuana    Comment: no longer using  . Sexual activity: Never  Lifestyle  . Physical activity:    Days per week: Not on file    Minutes per session:  Not on file  . Stress: Not on file  Relationships  . Social connections:    Talks on phone: Not on file    Gets together: Not on file    Attends religious service: Not on file    Active member of club or organization: Not on file    Attends meetings of clubs or organizations: Not on file    Relationship status: Not on file  Other Topics Concern  . Not on file  Social History Narrative  . Not on file    Allergies: No Known Allergies  Metabolic Disorder Labs: Lab Results  Component Value Date   HGBA1C 4.7 (L) 12/09/2017   MPG 88.19 12/09/2017   Lab Results  Component Value Date   PROLACTIN 5.0 04/29/2014   PROLACTIN 46.1 (H) 12/17/2013   Lab Results  Component Value Date   CHOL 204 (H) 12/09/2017   TRIG 168 (H) 12/09/2017   HDL 33 (L) 12/09/2017   CHOLHDL 6.2 12/09/2017   VLDL 34 12/09/2017   LDLCALC 137 (H) 12/09/2017   Lab Results  Component Value Date   TSH 2.304 10/06/2017    Therapeutic Level Labs: Lab Results  Component Value Date   LITHIUM 0.2 (L) 10/24/2017   LITHIUM 0.18 (L) 10/06/2017   Lab Results  Component Value Date   VALPROATE 84.5 10/31/2014   VALPROATE 83.4 04/29/2014   No components found for:  CBMZ  Current Medications: Current Outpatient Medications  Medication Sig Dispense Refill  . nicotine (NICODERM CQ - DOSED IN MG/24 HR) 7 mg/24hr patch Place onto the skin.    . ARIPiprazole (ABILIFY) 10 MG tablet Take by mouth.    . hydrOXYzine (ATARAX/VISTARIL) 25 MG tablet Take 1 tablet (25 mg total) by mouth 3 (three) times daily as needed for anxiety. 30 tablet 0  . pantoprazole (PROTONIX) 20 MG tablet Take 1 tablet (20 mg total) by mouth daily. 15 tablet 0  . traZODone (DESYREL) 50 MG tablet Take 1 tablet (50 mg total) by mouth at bedtime as needed for sleep. 30 tablet 0  . venlafaxine XR (EFFEXOR-XR) 75 MG 24 hr capsule Take 1 capsule (75 mg total) by mouth daily with breakfast. 30 capsule 0   No current facility-administered medications  for this visit.      Musculoskeletal: Strength & Muscle Tone: within normal limits Gait & Station: normal Patient leans: N/A  Psychiatric Specialty Exam: ROS  Blood pressure (!) 133/96, pulse (!) 115, height 5\' 7"  (1.702 m), weight 285 lb (129.3 kg).Body mass index is 44.64 kg/m.  General Appearance: Casual  Eye Contact:  Fair  Speech:  Clear and Coherent  Volume:  Normal  Mood:  Dysphoric  Affect:  Congruent  Thought Process:  Goal Directed  Orientation:  Full (Time, Place, and Person)  Thought Content: Logical   Suicidal Thoughts:  No  Homicidal Thoughts:  No  Memory:  Immediate;   Fair  Judgement:  Fair  Insight:  Fair  Psychomotor Activity:  Decreased  Concentration:  Concentration: Fair and Attention Span: Fair  Recall:  Good  Fund of Knowledge: Good  Language: Good  Akathisia:  No  Handed:  Right  AIMS (if indicated): not done  Assets:  Communication Skills Desire for Improvement Housing Resilience  ADL's:  Intact  Cognition: WNL  Sleep:  Fair   Screenings: AIMS     Ad[facility] (Discharged) [facility] Admission (Discharged) [facility]  AIMS Total Score  0  0    AUDIT     Admission (Discharged) from [facility] Admission (Discharged) from [facility]  Alcohol Use Disorder Identification Test Final Score (AUDIT)  7  0    GAD-7     Office Visit from 09/22/2016 in [facility] AT [place]  Total GAD-7 Score  21    PHQ2-9     Office Visit from 09/22/2016 in [facility] AT [place] Office Visit from 04/21/2016 in BE[facility]ENTER AT [place]  PHQ-2 Total Score  5  5  PHQ-9 Total Score  23  21       Assessment and Plan: Bipolar disorder type I, borderline  personality disorder.  I review blood work results and recent discharge summary from behavioral health center.  I do not have discharge summary and current medication from [facility] where he was recently admitted.  Patient promised that he will bring the discharge summary and current medication.  He was given Abilify injection but he does not know when.  I encouraged to continue Abilify injection since he has a history of noncompliance with medication.  At this time patient feels current medicine is working.  Continue Effexor at present dose.  Continue therapy at [facility].  Recommended to call us back if is any question or any concern.  Follow-up in 4 weeks.  Time spent 25 minutes.  More than 50% of the time spent in psychoeducation, counseling, coronation of care and reviewing his chart.   Cleotis NipperSyed T Athea Haley, MD 01/01/2018, 4:28 PM

## 2018-01-22 ENCOUNTER — Ambulatory Visit (INDEPENDENT_AMBULATORY_CARE_PROVIDER_SITE_OTHER): Payer: BLUE CROSS/BLUE SHIELD | Admitting: Psychiatry

## 2018-01-22 VITALS — BP 97/86 | HR 115 | Ht 67.0 in | Wt 280.0 lb

## 2018-01-22 DIAGNOSIS — F411 Generalized anxiety disorder: Secondary | ICD-10-CM | POA: Diagnosis not present

## 2018-01-22 DIAGNOSIS — F319 Bipolar disorder, unspecified: Secondary | ICD-10-CM | POA: Diagnosis not present

## 2018-01-22 DIAGNOSIS — F603 Borderline personality disorder: Secondary | ICD-10-CM | POA: Diagnosis not present

## 2018-01-22 MED ORDER — ARIPIPRAZOLE ER 400 MG IM SRER
400.0000 mg | Freq: Once | INTRAMUSCULAR | Status: AC
  Administered 2018-01-23: 400 mg via INTRAMUSCULAR

## 2018-01-22 MED ORDER — VENLAFAXINE HCL ER 150 MG PO CP24: 150.0000 mg | ORAL_CAPSULE | Freq: Every day | ORAL | 0 refills | Status: DC

## 2018-01-22 MED ORDER — HYDROXYZINE HCL 25 MG PO TABS: 25.0000 mg | ORAL_TABLET | Freq: Every day | ORAL | 0 refills | Status: DC | PRN

## 2018-01-22 NOTE — Progress Notes (Signed)
BH MD/PA/NP OP Progress Note  01/22/2018 3:37 PM Andrew Noble  MRN:  [id number]  Chief Complaint: I am doing better.  I brought medication bottles with me.  HPI: Andrew Noble came for his appointment.  On his last visit he did not remember the medication he was taking.  Today he brought the bottles with him.  He is taking Effexor XR and 50 mg daily, Vistaril 25 mg daily and he was given Abilify 400 mg intramuscular when he was admitted at [name].  He was prescribed Abilify 10 mg but he has not taken it because he prefer injection.  However he cannot afford co-pay for his injection.  He is wondering if we can provide samples.  Overall he likes his mood.  He is working and enjoying his job.  He is not irritable, angry, or having any mood swing or paranoia.  He is seeing therapist Dr. Darnelle Noble [name] outpatient on a regular basis.  Patient denies any hallucination, suicidal thoughts or homicidal thought.  His energy level is good.  Patient diagnosed with borderline personality disorder.  Visit Diagnosis:    ICD-10-CM   1. Bipolar 1 disorder (HCC) F31.9 ARIPiprazole ER (ABILIFY MAINTENA) injection 400 mg  2. GAD (generalized anxiety disorder) F41.1 venlafaxine XR (EFFEXOR-XR) 150 MG 24 hr capsule    hydrOXYzine (ATARAX/VISTARIL) 25 MG tablet    DISCONTINUED: hydrOXYzine (ATARAX/VISTARIL) 25 MG tablet    Past Psychiatric History: Reviewed Patient has an extensive mental health history per about 6 hospitalizations for SI and at least one for a past eating disorder. Primary dx is Bipolar I Disorder and borderline personality disorder.[name] He was recently an inpatient at [name], admitted about June 20th, 2019 and July 2019.  He was again admitted in September at [name].  He has taken Abilify injection, lithium which cause shakes, Prozac which was discontinued on his inpatient treatment.  He was also tried Neurontin, Inderal, trazodone.  He denies any history of suicidal attempt or  psychosis.   Past Medical History:  Past Medical History:  Diagnosis Date  . Abnormal laboratory test 02/12/2014  . Anxiety   . Bipolar disorder (HCC)   . Depression   . Hx of substance abuse 02/12/2014  . Psychosis [facility])     Past Surgical History:  Procedure Laterality Date  . TONSILLECTOMY AND ADENOIDECTOMY      Family Psychiatric History: Reviewed  Family History:  Family History  Problem Relation Age of Onset  . Depression Mother   . Anxiety disorder Mother   . Bipolar disorder Maternal Aunt     Social History:  Social History   Socioeconomic History  . Marital status: Single    Spouse name: Not on file  . Number of children: Not on file  . Years of education: Not on file  . Highest education level: Not on file  Occupational History  . Not on file  Social Needs  . Financial resource strain: Not on file  . Food insecurity:    Worry: Not on file    Inability: Not on file  . Transportation needs:    Medical: Not on file    Non-medical: Not on file  Tobacco Use  . Smoking status: Light Tobacco Smoker    Packs/day: 0.25    Years: 1.00    Pack years: 0.25    Types: Cigarettes  . Smokeless tobacco: Never Used  . Tobacco comment: down to a few cigarettes daily  Substance and Sexual Activity  . Alcohol  use: Yes    Alcohol/week: 1.0 - 2.0 standard drinks    Types: 1 - 2 Cans of beer per week    Comment: rare  . Drug use: Yes    Types: Marijuana    Comment: no longer using  . Sexual activity: Never  Lifestyle  . Physical activity:    Days per week: Not on file    Minutes per session: Not on file  . Stress: Not on file  Relationships  . Social connections:    Talks on phone: Not on file    Gets together: Not on file    Attends religious service: Not on file    Active member of club or organization: Not on file    Attends meetings of clubs or organizations: Not on file    Relationship status: Not on file  Other Topics Concern  . Not on file  Social  History Narrative  . Not on file    Allergies: No Known Allergies  Metabolic Disorder Labs: Lab Results  Component Value Date   HGBA1C 4.7 (L) 12/09/2017   MPG 88.19 12/09/2017   Lab Results  Component Value Date   PROLACTIN 5.0 04/29/2014   PROLACTIN 46.1 (H) 12/17/2013   Lab Results  Component Value Date   CHOL 204 (H) 12/09/2017   TRIG 168 (H) 12/09/2017   HDL 33 (L) 12/09/2017   CHOLHDL 6.2 12/09/2017   VLDL 34 12/09/2017   LDLCALC 137 (H) 12/09/2017   Lab Results  Component Value Date   TSH 2.304 10/06/2017    Therapeutic Level Labs: Lab Results  Component Value Date   LITHIUM 0.2 (L) 10/24/2017   LITHIUM 0.18 (L) 10/06/2017   Lab Results  Component Value Date   VALPROATE 84.5 10/31/2014   VALPROATE 83.4 04/29/2014   No components found for:  CBMZ  Current Medications: Current Outpatient Medications  Medication Sig Dispense Refill  . ARIPiprazole (ABILIFY) 10 MG tablet Take by mouth.    . hydrOXYzine (ATARAX/VISTARIL) 25 MG tablet Take 1 tablet (25 mg total) by mouth 3 (three) times daily as needed for anxiety. 30 tablet 0  . pantoprazole (PROTONIX) 20 MG tablet Take 1 tablet (20 mg total) by mouth daily. 15 tablet 0  . venlafaxine XR (EFFEXOR-XR) 75 MG 24 hr capsule Take 1 capsule (75 mg total) by mouth daily with breakfast. 30 capsule 0   No current facility-administered medications for this visit.      Musculoskeletal: Strength & Muscle Tone: within normal limits Gait & Station: normal Patient leans: N/A  Psychiatric Specialty Exam: ROS  Blood pressure 97/86, pulse (!) 115, height 5\' 7"  (1.702 m), weight 280 lb (127 kg).There is no height or weight on file to calculate BMI.  General Appearance: Casual  Eye Contact:  Fair  Speech:  Clear and Coherent  Volume:  Normal  Mood:  Anxious  Affect:  Appropriate  Thought Process:  Goal Directed  Orientation:  Full (Time, Place, and Person)  Thought Content: Logical   Suicidal Thoughts:  No   Homicidal Thoughts:  No  Memory:  Immediate;   Good Recent;   Good Remote;   Good  Judgement:  Good  Insight:  Good  Psychomotor Activity:  Normal  Concentration:  Concentration: Fair and Attention Span: Fair  Recall:  Good  Fund of Knowledge: Good  Language: Good  Akathisia:  No  Handed:  Right  AIMS (if indicated): not done  Assets:  Communication Skills Desire for Improvement Housing Resilience Talents/Skills  ADL's:  Intact  Cognition: WNL  Sleep:  Fair   Screenings: AIMS     Admission (Discharged) from [facility] Admission (Discharged) from [facility]  AIMS Total Score  0  0    AUDIT     Admission (Discharged) from [facility] 12[facility] Admission (Discharged) from 10[facility]  Alcohol Use Disorder Identification Test Final Score (AUDIT)  7  0    GAD-7     Office Visit from 09/22/2016 in [facility] AT [place]  Total GAD-7 Score  21    PHQ2-9     Office Visit from 09/22/2016 in [facility] AT [place] Office Visit from 04/21/2016 in [facility] AT [place]  PHQ-2 Total Score  5  5  PHQ-9 Total Score  23  21       Assessment and Plan: Bipolar disorder type I.  Borderline personality disorder.  Anxiety disorder NOS.  I had a long discussion with the patient about risk of noncompliance with the medication.  Patient like to get Abilify injection every 4 weeks.  He promised to come tomorrow for Abilify injection.  I will continue his Effexor and Vistaril.  I also encouraged to see his therapist on a regular basis.  Recommended to call us back if he has any question or any concern.  He has no tremors shakes or any EPS.  Follow-up in 3 months.  However he will get monthly injection of  Abilify at our office.   Cleotis Nipper, MD 01/22/2018, 3:37 PM

## 2018-01-23 ENCOUNTER — Ambulatory Visit (INDEPENDENT_AMBULATORY_CARE_PROVIDER_SITE_OTHER): Payer: BLUE CROSS/BLUE SHIELD

## 2018-01-23 DIAGNOSIS — F319 Bipolar disorder, unspecified: Secondary | ICD-10-CM | POA: Diagnosis not present

## 2018-01-23 NOTE — Progress Notes (Signed)
Patient presented with flat affect, level mood and denied any suicidal or homicidal ideations, no auditory or visual hallucinations and no other complaints.Patient denied any other concerns or symptoms today and reported doing okay with Abilify Maintena injections every 4 weeks. Patient's due Abilify Maintena 400 mg IM injection prepared as ordered and given to patient in hisrightdeltoid area. Patient tolerated due injection without complaint of pain or discomfort and agreed to return in 4 weeks for next due injection. Patient to call if any problems or change in symptoms prior to next due injection. 

## 2018-01-25 ENCOUNTER — Ambulatory Visit: Payer: Self-pay | Admitting: Pulmonary Disease

## 2018-01-29 ENCOUNTER — Other Ambulatory Visit (HOSPITAL_COMMUNITY): Payer: Self-pay

## 2018-01-29 DIAGNOSIS — F411 Generalized anxiety disorder: Secondary | ICD-10-CM

## 2018-01-29 MED ORDER — HYDROXYZINE HCL 25 MG PO TABS: 25.0000 mg | ORAL_TABLET | Freq: Every day | ORAL | 0 refills | Status: DC | PRN

## 2018-01-29 MED ORDER — VENLAFAXINE HCL ER 150 MG PO CP24: 150.0000 mg | ORAL_CAPSULE | Freq: Every day | ORAL | 0 refills | Status: DC

## 2018-01-31 ENCOUNTER — Telehealth: Payer: Self-pay | Admitting: Pulmonary Disease

## 2018-01-31 DIAGNOSIS — G4733 Obstructive sleep apnea (adult) (pediatric): Secondary | ICD-10-CM

## 2018-01-31 NOTE — Telephone Encounter (Signed)
Order has been placed and faxed to [email or web address] Pt has been made aware. Nothing further was needed at this time.

## 2018-02-06 ENCOUNTER — Ambulatory Visit (HOSPITAL_COMMUNITY): Payer: BLUE CROSS/BLUE SHIELD | Admitting: Psychiatry

## 2018-02-23 ENCOUNTER — Ambulatory Visit (INDEPENDENT_AMBULATORY_CARE_PROVIDER_SITE_OTHER): Payer: BLUE CROSS/BLUE SHIELD

## 2018-02-23 DIAGNOSIS — F314 Bipolar disorder, current episode depressed, severe, without psychotic features: Secondary | ICD-10-CM | POA: Diagnosis not present

## 2018-02-23 DIAGNOSIS — F319 Bipolar disorder, unspecified: Secondary | ICD-10-CM

## 2018-02-23 MED ORDER — ARIPIPRAZOLE ER 400 MG IM PRSY
400.0000 mg | PREFILLED_SYRINGE | INTRAMUSCULAR | Status: DC
  Administered 2018-02-23 – 2018-03-23 (×2): 400 mg via INTRAMUSCULAR

## 2018-02-23 NOTE — Progress Notes (Signed)
Patient presented with flat affect, level mood and denied any suicidal or homicidal ideations, no auditory or visual hallucinations and no other complaints.Patient denied any other concerns or symptoms today and reported doing okay with Abilify Maintena injections every 4 weeks. Patient's due Abilify Maintena 400 mg IM injection prepared as ordered and given to patient in hisleftdeltoid area. Patient tolerated due injection without complaint of pain or discomfort and agreed to return in 4 weeks for next due injection. Patient to call if any problems or change in symptoms prior to next due injection.

## 2018-03-23 ENCOUNTER — Ambulatory Visit (INDEPENDENT_AMBULATORY_CARE_PROVIDER_SITE_OTHER): Payer: BLUE CROSS/BLUE SHIELD

## 2018-03-23 DIAGNOSIS — F319 Bipolar disorder, unspecified: Secondary | ICD-10-CM

## 2018-03-23 NOTE — Progress Notes (Signed)
Patient presented with flat affect, level mood and denied any suicidal or homicidal ideations, no auditory or visual hallucinations and no other complaints.Patient denied any other concerns or symptoms today and reported doing okay with Abilify Maintena injections every 4 weeks. Patient's due Abilify Maintena 400 mg IM injection prepared as ordered and given to patient in hisrightdeltoid area. Patient tolerated due injection without complaint of pain or discomfort and agreed to return in 4 weeks for next due injection. Patient to call if any problems or change in symptoms prior to next due injection.

## 2018-03-29 DIAGNOSIS — R42 Dizziness and giddiness: Secondary | ICD-10-CM | POA: Diagnosis not present

## 2018-03-29 DIAGNOSIS — H6123 Impacted cerumen, bilateral: Secondary | ICD-10-CM | POA: Diagnosis not present

## 2018-03-30 DIAGNOSIS — H6123 Impacted cerumen, bilateral: Secondary | ICD-10-CM | POA: Diagnosis not present

## 2018-03-30 DIAGNOSIS — R42 Dizziness and giddiness: Secondary | ICD-10-CM | POA: Diagnosis not present

## 2018-03-31 ENCOUNTER — Inpatient Hospital Stay (HOSPITAL_COMMUNITY)
Admission: RE | Admit: 2018-03-31 | Discharge: 2018-04-04 | DRG: 885 | Disposition: A | Discharge status: Home / Self Care | Payer: BLUE CROSS/BLUE SHIELD | Attending: Psychiatry | Admitting: Psychiatry

## 2018-03-31 DIAGNOSIS — F419 Anxiety disorder, unspecified: Secondary | ICD-10-CM | POA: Diagnosis present

## 2018-03-31 DIAGNOSIS — R45851 Suicidal ideations: Secondary | ICD-10-CM | POA: Diagnosis not present

## 2018-03-31 DIAGNOSIS — G4700 Insomnia, unspecified: Secondary | ICD-10-CM | POA: Diagnosis not present

## 2018-03-31 DIAGNOSIS — F332 Major depressive disorder, recurrent severe without psychotic features: Secondary | ICD-10-CM | POA: Diagnosis not present

## 2018-03-31 DIAGNOSIS — Z79899 Other long term (current) drug therapy: Secondary | ICD-10-CM | POA: Diagnosis not present

## 2018-03-31 DIAGNOSIS — Z818 Family history of other mental and behavioral disorders: Secondary | ICD-10-CM

## 2018-03-31 MED ORDER — ACETAMINOPHEN 325 MG PO TABS
650.0000 mg | ORAL_TABLET | Freq: Four times a day (QID) | ORAL | Status: DC | PRN
  Administered 2018-04-01 – 2018-04-02 (×3): 650 mg via ORAL
  Filled 2018-03-31 (×3): qty 2

## 2018-03-31 MED ORDER — HYDROXYZINE HCL 25 MG PO TABS
25.0000 mg | ORAL_TABLET | Freq: Three times a day (TID) | ORAL | Status: DC | PRN
  Administered 2018-04-01 – 2018-04-03 (×6): 25 mg via ORAL
  Filled 2018-03-31 (×6): qty 1

## 2018-03-31 MED ORDER — MAGNESIUM HYDROXIDE 400 MG/5ML PO SUSP: 30.0000 mL | Freq: Every day | ORAL | Status: DC | PRN

## 2018-03-31 MED ORDER — TRAZODONE HCL 50 MG PO TABS
50.0000 mg | ORAL_TABLET | Freq: Every evening | ORAL | Status: DC | PRN
  Administered 2018-04-01: 50 mg via ORAL
  Filled 2018-03-31: qty 1

## 2018-03-31 MED ORDER — ALUM & MAG HYDROXIDE-SIMETH 200-200-20 MG/5ML PO SUSP: 30.0000 mL | ORAL | Status: DC | PRN

## 2018-04-01 ENCOUNTER — Encounter (HOSPITAL_COMMUNITY): Payer: Self-pay | Admitting: *Deleted

## 2018-04-01 ENCOUNTER — Other Ambulatory Visit: Payer: Self-pay

## 2018-04-01 DIAGNOSIS — F332 Major depressive disorder, recurrent severe without psychotic features: Principal | ICD-10-CM

## 2018-04-01 DIAGNOSIS — R45851 Suicidal ideations: Secondary | ICD-10-CM

## 2018-04-01 LAB — LIPID PANEL
Cholesterol: 172 mg/dL (ref 0–200)
HDL: 33 mg/dL — ABNORMAL LOW (ref >40)
LDL Cholesterol: 112 mg/dL — ABNORMAL HIGH (ref 0–99)
Total CHOL/HDL Ratio: 5.2 RATIO
Triglycerides: 137 mg/dL (ref <150)
VLDL: 27 mg/dL (ref 0–40)

## 2018-04-01 LAB — COMPREHENSIVE METABOLIC PANEL
ALT: 90 U/L — ABNORMAL HIGH (ref 0–44)
AST: 51 U/L — ABNORMAL HIGH (ref 15–41)
Albumin: 4.1 g/dL (ref 3.5–5.0)
Alkaline Phosphatase: 60 U/L (ref 38–126)
Anion gap: 14 (ref 5–15)
BILIRUBIN TOTAL: 0.8 mg/dL (ref 0.3–1.2)
BUN: 15 mg/dL (ref 6–20)
CO2: 22 mmol/L (ref 22–32)
Calcium: 9.3 mg/dL (ref 8.9–10.3)
Chloride: 104 mmol/L (ref 98–111)
Creatinine, Ser: 1.14 mg/dL (ref 0.61–1.24)
GFR calc Af Amer: >60 mL/min (ref >60)
GFR calc non Af Amer: >60 mL/min (ref >60)
Glucose, Bld: 178 mg/dL — ABNORMAL HIGH (ref 70–99)
Potassium: 4 mmol/L (ref 3.5–5.1)
Sodium: 140 mmol/L (ref 135–145)
TOTAL PROTEIN: 7 g/dL (ref 6.5–8.1)

## 2018-04-01 LAB — CBC
HCT: 49.9 % (ref 39.0–52.0)
Hemoglobin: 16.5 g/dL (ref 13.0–17.0)
MCH: 31 pg (ref 26.0–34.0)
MCHC: 33.1 g/dL (ref 30.0–36.0)
MCV: 93.6 fL (ref 80.0–100.0)
NRBC: 0 % (ref 0.0–0.2)
Platelets: 259 10*3/uL (ref 150–400)
RBC: 5.33 MIL/uL (ref 4.22–5.81)
RDW: 13.2 % (ref 11.5–15.5)
WBC: 8.4 10*3/uL (ref 4.0–10.5)

## 2018-04-01 LAB — HEMOGLOBIN A1C
Hgb A1c MFr Bld: 4.9 % (ref 4.8–5.6)
Mean Plasma Glucose: 93.93 mg/dL

## 2018-04-01 LAB — TSH: TSH: 2.113 u[IU]/mL (ref 0.350–4.500)

## 2018-04-01 MED ORDER — NICOTINE 21 MG/24HR TD PT24
21.0000 mg | MEDICATED_PATCH | Freq: Every day | TRANSDERMAL | Status: DC
  Administered 2018-04-01 – 2018-04-02 (×2): 21 mg via TRANSDERMAL
  Filled 2018-04-01 (×6): qty 1

## 2018-04-01 MED ORDER — ZIPRASIDONE MESYLATE 20 MG IM SOLR
INTRAMUSCULAR | Status: AC
  Filled 2018-04-01: qty 20

## 2018-04-01 MED ORDER — ZIPRASIDONE MESYLATE 20 MG IM SOLR: 20.0000 mg | Freq: Four times a day (QID) | INTRAMUSCULAR | Status: DC | PRN

## 2018-04-01 MED ORDER — NICOTINE 21 MG/24HR TD PT24
MEDICATED_PATCH | TRANSDERMAL | Status: AC
  Filled 2018-04-01: qty 1

## 2018-04-01 MED ORDER — ZIPRASIDONE MESYLATE 20 MG IM SOLR
20.0000 mg | Freq: Once | INTRAMUSCULAR | Status: AC
  Administered 2018-04-01: 20 mg via INTRAMUSCULAR
  Filled 2018-04-01: qty 20

## 2018-04-01 MED ORDER — BACITRACIN-NEOMYCIN-POLYMYXIN OINTMENT TUBE
TOPICAL_OINTMENT | Freq: Three times a day (TID) | CUTANEOUS | Status: DC
  Administered 2018-04-01 – 2018-04-02 (×2): via TOPICAL
  Filled 2018-04-01 (×2): qty 14.17

## 2018-04-01 MED ORDER — VENLAFAXINE HCL ER 75 MG PO CP24
75.0000 mg | ORAL_CAPSULE | Freq: Every day | ORAL | Status: DC
  Administered 2018-04-02 – 2018-04-04 (×3): 75 mg via ORAL
  Filled 2018-04-01 (×6): qty 1

## 2018-04-01 NOTE — Progress Notes (Addendum)
Patient has slept since shortly after admission however patient snores loudly, even with CPAP use. Patient has kept roommate awake much of the night. NP notified and order received for private room.

## 2018-04-01 NOTE — Progress Notes (Signed)
1:1 Note  Patient observed talking on the phone. He nodded and waived when approached. His respirations are even and unlabored. Patient in NAD. Patient smiling. 1:1 maintained for patient safety. Will monitor closely.
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
## 2018-04-01 NOTE — Plan of Care (Signed)
D: Patient is less agitated since receiving the geodon. He is now complaining of lightheadedness, but vitals are WNL (see flowsheets). He states this has been going on for sometime prior to receiving injection. He is now disclosing that he injured his left foot because he kicked the door to his room earlier. He had not previously disclosed that. He complains of mild pain, but declines any intervention. Patient denies SI/HI/AVH.  A: Patient checked q15 min, and checks reviewed. Reviewed medication changes with patient and educated on side effects. Educated patient on importance of attending group therapy sessions and educated on several coping skills. Encouarged participation in milieu through recreation therapy and attending meals with peers. Support and encouragement provided. Fluids offered. R: Patient receptive to education on medications, and is medication compliant. Patient contracts for safety on the unit.

## 2018-04-01 NOTE — Progress Notes (Signed)
1:1 Note  Patient observed resting in bed in side lying position. His respirations are even and unlabored and he is in NAD. 1:1 safety attendant maintained and in room with patient. Patient is safe.

## 2018-04-01 NOTE — Tx Team (Signed)
Initial Treatment Plan 04/01/2018 0100 Andrew Noble [id number]    PATIENT STRESSORS: Financial difficulties Marital or family conflict Medication change or noncompliance   PATIENT STRENGTHS: Average or above average intelligence Capable of independent living Communication skills General fund of knowledge Motivation for treatment/growth Physical Health Supportive family/friends Work skills   PATIENT IDENTIFIED PROBLEMS:   "I really don't know. I guess the meds would work if I'd take them."    "I need more ideas on coping skills."               DISCHARGE CRITERIA:  Improved stabilization in mood, thinking, and/or behavior Need for constant or close observation no longer present Reduction of life-threatening or endangering symptoms to within safe limits Verbal commitment to aftercare and medication compliance  PRELIMINARY DISCHARGE PLAN: Outpatient therapy Return to previous living arrangement Return to previous work or school arrangements  PATIENT/FAMILY INVOLVEMENT: This treatment plan has been presented to and reviewed with the patient, Andrew Noble, and/or family member.  The patient and family have been given the opportunity to ask questions and make suggestions.  Lawrence MarseillesFriedman, Mustafa Potts Eakes, RN 04/01/2018, 1:39 AM
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
## 2018-04-01 NOTE — H&P (Signed)
Behavioral Health Medical Screening Exam  Andrew Noble is an 22 y.o. male.  Total Time spent with patient: 15 minutes  Psychiatric Specialty Exam: Physical Exam  Constitutional: He is oriented to person, place, and time. He appears well-developed and well-nourished. No distress.  HENT:  Head: Normocephalic and atraumatic.  Right Ear: External ear normal.  Left Ear: External ear normal.  Eyes: Pupils are equal, round, and reactive to light. Conjunctivae are normal. Right eye exhibits no discharge. Left eye exhibits no discharge.  Respiratory: Effort normal. No respiratory distress.  Musculoskeletal: Normal range of motion.  Neurological: He is alert and oriented to person, place, and time.  Skin: He is not diaphoretic.  Psychiatric: His speech is normal. His mood appears anxious. His affect is blunt. He is not withdrawn and not actively hallucinating. Thought content is not paranoid and not delusional. Cognition and memory are normal. He exhibits a depressed mood. He expresses suicidal ideation. He expresses no homicidal ideation. He expresses suicidal plans.    Review of Systems  Constitutional: Negative for chills, fever and weight loss.  Psychiatric/Behavioral: Positive for depression and suicidal ideas. Negative for hallucinations, memory loss and substance abuse. The patient is nervous/anxious and has insomnia.   All other systems reviewed and are negative.   There were no vitals taken for this visit.There is no height or weight on file to calculate BMI.  General Appearance: Casual and Well Groomed  Eye Contact:  Fair  Speech:  Clear and Coherent and Normal Rate  Volume:  Normal  Mood:  Anxious, Depressed, Dysphoric, Hopeless and Worthless  Affect:  Blunt and Congruent  Thought Process:  Coherent, Goal Directed and Descriptions of Associations: Intact  Orientation:  Full (Time, Place, and Person)  Thought Content:  Logical, Hallucinations: None and Rumination  Suicidal  Thoughts:  Yes.  with intent/plan  Homicidal Thoughts:  No  Memory:  Immediate;   Good Recent;   Fair  Judgement:  Impaired  Insight:  Fair  Psychomotor Activity:  Normal  Concentration: Concentration: Fair and Attention Span: Fair  Recall:  Good  Fund of Knowledge:Good  Language: Good  Akathisia:  No  Handed:  Right  AIMS (if indicated):     Assets:  Communication Skills Desire for Improvement Financial Resources/Insurance Housing Intimacy Leisure Time Physical Health  Sleep:       Musculoskeletal: Strength & Muscle Tone: within normal limits Gait & Station: normal    Recommendations:  Based on my evaluation the patient does not appear to have an emergency medical condition.  Jackelyn PolingJason A Draven Natter, NP 04/01/2018, 12:01 AM

## 2018-04-01 NOTE — Progress Notes (Signed)
1:1 note  Patient is sitting in dayroom talking with his safety attendant. He notified me of left foot pain due to kicking door earlier today. He says he felt like the Geodon was a "horse tranquilizer," but denies other side effects besides sedation. He says he does not feel calm and relaxed at this time. I offered medication, but he declined. He presents calm and has been seen laughing and talking calmly in the dayroom from time to time. 1:1 continued for patient safety.

## 2018-04-01 NOTE — Progress Notes (Signed)
D:  Pt is pleasant and cooperative. Pt stated he did not think the Abilify shot was working for him. Pt was encouraged to focus on what he needs to do about a situation. Pt was informed that when he gets upset that he needed to take a step back and look at the situation from all sides and think of possible outcomes before he makes a decision.  A: Pt was offered support and encouragement. Pt was given sleep medications. Pt was encourage to attend groups. Q 15 minute checks were done for safety.  R: safety maintained on unit.  Problem: Education: Goal: Verbalization of understanding the information provided will improve Outcome: Progressing   Problem: Activity: Goal: Interest or engagement in activities will improve 04/01/2018 2156 by Delos HaringPhillips, Kyel Purk A, RN Outcome: Progressing 04/01/2018 2154 by Delos HaringPhillips, Alba Perillo A, RN Outcome: Progressing
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
## 2018-04-01 NOTE — Progress Notes (Signed)
Nursing 1:1 note D:Pt observed sleeping in bed with eyes closed. RR even and unlabored. No distress noted. A: 1:1 observation continues for safety  R: pt remains safe  
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
## 2018-04-01 NOTE — H&P (Signed)
Psychiatric Admission Assessment Adult  Patient Identification: Andrew Noble MRN:  [id number] Date of Evaluation:  04/01/2018 Chief Complaint:  Bipolar 1 disorder  Mre depressed severe Principal Diagnosis: <principal problem not specified> Diagnosis:  Active Problems:   Severe recurrent major depression without psychotic features [facility]) Patient is seen and examined.  Patient is a 22 year old male with a past psychiatric history significant for borderline personality disorder, bipolar disorder either type I or type II, and possible posttraumatic stress disorder who presented to the behavioral health hospital as a direct admission.  He stated that he was having suicidal ideation, and "if I had a gun I would shoot myself, but I do not have one.  The patient's last admission to a psychiatric facility was on 12/22/2017 at [facility].  He was discharged at that time on Abilify 10 mg p.o. daily, Effexor long-acting 150 mg p.o. daily, trazodone 50 mg p.o. nightly.  The patient stated that he had stopped taking his medicines approximately a month ago.  He did receive his long-acting Abilify injection on 12/6.  He stated that he is sick and tired of being admitted to the hospital.  He sees all of this is being futile.  He stated he does not want to take medication, and all he wants to do is ended all.  He stated he has been told by one doctor that he should have "transcranial magnetic stimulation", but otherwise he sees no into it except his death.  His last psychiatric hospitalization at our facility was on 12/07/2017.  He was discharged on hydroxyzine, pantoprazole, trazodone, venlafaxine extended release.  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  He informed who he thought was appropriate staff, but was then punished for "spreading gossip".  He stated he is tired of just going to work, coming home, going to bed.   He stated he is tired of just barely making ends meet.  Shortly after the interview he went to his room, and became significantly agitated.  He kicked an outside door briskly on several occasions, and then went into his room and slammed his elbow into a window which was then fractured.  He was transferred to the 500 hall, and given Geodon.  After he calmed down he was reassessed, and was remorseful about his behavior.  He was admitted to the hospital for evaluation and stabilization.   History of Present Illness:  Associated Signs/Symptoms: Depression Symptoms:  depressed mood, anhedonia, insomnia, psychomotor agitation, fatigue, feelings of worthlessness/guilt, difficulty concentrating, hopelessness, suicidal thoughts without plan, anxiety, loss of energy/fatigue, disturbed sleep, weight gain, (Hypo) Manic Symptoms:  Impulsivity, Irritable Mood, Labiality of Mood, Anxiety Symptoms:  Excessive Worry, Psychotic Symptoms:  Denied PTSD Symptoms: Negative Total Time spent with patient: 1 hour  Past Psychiatric History: Patient has multiple psychiatric hospitalizations over the last year.  He has been diagnosed with bipolar disorder, depression, anxiety disorder and borderline personality disorder.  His last psychiatric hospitalization at our facility was in 12/07/2017.  In between that hospitalization and his current hospitalization he was also hospitalized at [facility].  He is followed by Dr. Florentina Jenny at our facility.  Is the patient at risk to self? Yes.    Has the patient been a risk to self in the past 6 months? Yes.    Has the patient been a risk to self within the distant past? Yes.    Is  the patient a risk to others? No.  Has the patient been a risk to others in the past 6 months? No.  Has the patient been a risk to others within the distant past? No.   Prior Inpatient Therapy: Prior Inpatient Therapy: Yes Prior Therapy Dates: (6/19, 8/19, 12/22/17) Prior Therapy  Facilty/Provider(s): [facility] Reason for Treatment: (SI) Prior Outpatient Therapy: Prior Outpatient Therapy: Yes Prior Therapy Dates: (current) Prior Therapy Facilty/Provider(s): ([facility]) Reason for Treatment: (bipolar and depression) Does patient have an ACCT team?: No Does patient have Intensive In-House Services?  : No Does patient have [facility] services? : No Does patient have P4CC services?: No  Alcohol Screening: 1. How often do you have a drink containing alcohol?: Monthly or less 2. How many drinks containing alcohol do you have on a typical day when you are drinking?: 1 or 2 3. How often do you have six or more drinks on one occasion?: Never AUDIT-C Score: 1 4. How often during the last year have you found that you were not able to stop drinking once you had started?: Never 5. How often during the last year have you failed to do what was normally expected from you becasue of drinking?: Never 6. How often during the last year have you needed a first drink in the morning to get yourself going after a heavy drinking session?: Never 7. How often during the last year have you had a feeling of guilt of remorse after drinking?: Never 8. How often during the last year have you been unable to remember what happened the night before because you had been drinking?: Never 9. Have you or someone else been injured as a result of your drinking?: No 10. Has a relative or friend or a doctor or another health worker been concerned about your drinking or suggested you cut down?: No Alcohol Use Disorder Identification Test Final Score (AUDIT): 1 Intervention/Follow-up: AUDIT Score <7 follow-up not indicated Substance Abuse History in the last 12 months:  No. Consequences of Substance Abuse: Negative Previous Psychotropic Medications: Yes  Psychological Evaluations: Yes  Past Medical History:  Past Medical History:  Diagnosis Date  . Abnormal laboratory test 02/12/2014  . Anxiety    . Bipolar disorder (HCC)   . Depression   . Hx of substance abuse (HCC) 02/12/2014  . Psychosis [facility](HCC)     Past Surgical History:  Procedure Laterality Date  . TONSILLECTOMY AND ADENOIDECTOMY     Family History:  Family History  Problem Relation Age of Onset  . Depression Mother   . Anxiety disorder Mother   . Bipolar disorder Maternal Aunt    Family Psychiatric  History: Mother has depression and anxiety, maternal aunt has bipolar disorder. Tobacco Screening: Have you used any form of tobacco in the last 30 days? (Cigarettes, Smokeless Tobacco, Cigars, and/or Pipes): Yes Tobacco use, Select all that apply: 5 or more cigarettes per day, smokeless tobacco use, not daily(pt quit 03/28/18) Are you interested in Tobacco Cessation Medications?: Yes, will notify MD for an order Counseled patient on smoking cessation including recognizing danger situations, developing coping skills and basic information about quitting provided: Yes Social History:  Social History   Substance and Sexual Activity  Alcohol Use Yes  . Alcohol/week: 1.0 - 2.0 standard drinks  . Types: 1 - 2 Cans of beer per week   Comment: rare     Social History   Substance and Sexual Activity  Drug Use Yes  . Types: Marijuana   Comment:  no longer using    Additional Social History: Marital status: Single    Pain Medications: see MAR Prescriptions: see MAR Over the Counter: see MAR                    Allergies:  No Known Allergies Lab Results:  Results for orders placed or performed during the hospital encounter of 03/31/18 (from the past 48 hour(s))  Comprehensive metabolic panel     Status: Abnormal   Collection Time: 04/01/18  7:18 AM  Result Value Ref Range   Sodium 140 135 - 145 mmol/L   Potassium 4.0 3.5 - 5.1 mmol/L   Chloride 104 98 - 111 mmol/L   CO2 22 22 - 32 mmol/L   Glucose, Bld 178 (H) 70 - 99 mg/dL   BUN 15 6 - 20 mg/dL   Creatinine, Ser 1.61 0.61 - 1.24 mg/dL   Calcium 9.3 8.9  - 09.6 mg/dL   Total Protein 7.0 6.5 - 8.1 g/dL   Albumin 4.1 3.5 - 5.0 g/dL   AST 51 (H) 15 - 41 U/L   ALT 90 (H) 0 - 44 U/L   Alkaline Phosphatase 60 38 - 126 U/L   Total Bilirubin 0.8 0.3 - 1.2 mg/dL   GFR calc non Af Amer >60 >60 mL/min   GFR calc Af Amer >60 >60 mL/min   Anion gap 14 5 - 15    Comment: Performed at [id number], [address] [place], [place] [address]  Lipid panel     Status: Abnormal   Collection Time: 04/01/18  7:18 AM  Result Value Ref Range   Cholesterol 172 0 - 200 mg/dL   Triglycerides 981 <191 mg/dL   HDL 33 (L) >47 mg/dL   Total CHOL/HDL Ratio 5.2 RATIO   VLDL 27 0 - 40 mg/dL   LDL Cholesterol 829 (H) 0 - 99 mg/dL    Comment:        Total Cholesterol/HDL:CHD Risk Coronary Heart Disease Risk Table                     Men   Women  1/2 Average Risk   3.4   3.3  Average Risk       5.0   4.4  2 X Average Risk   9.6   7.1  3 X Average Risk  23.4   11.0        Use the calculated Patient Ratio above and the CHD Risk Table to determine the patient's CHD Risk.        ATP III CLASSIFICATION (LDL):  <100     mg/dL   Optimal  562-130  mg/dL   Near or Above                    Optimal  130-159  mg/dL   Borderline  865-784  mg/dL   High  >696     mg/dL   Very High Performed at [facility], [address] [place], [place] [address]   TSH     Status: None   Collection Time: 04/01/18  7:18 AM  Result Value Ref Range   TSH 2.113 0.350 - 4.500 uIU/mL    Comment: Performed by a 3rd Generation assay with a functional sensitivity of <=0.01 uIU/mL. Performed at [facility], [address], [place], [place] [address]   CBC     Status: None   Collection Time: 04/01/18  7:18 AM  Result Value Ref Range   WBC 8.4 4.0 - 10.5 K/uL   RBC 5.33 4.22 - 5.81 MIL/uL   Hemoglobin 16.5 13.0 - 17.0 g/dL   HCT 16.1 09.6 - 04.5 %   MCV 93.6 80.0 - 100.0 fL   MCH 31.0 26.0 - 34.0 pg   MCHC 33.1 30.0 - 36.0 g/dL    RDW 40.9 81.1 - 91.4 %   Platelets 259 150 - 400 K/uL   nRBC 0.0 0.0 - 0.2 %    Comment: Performed at [facility], [address], [place], [place] [address]    Blood Alcohol level:  Lab Results  Component Value Date   ETH <10 10/04/2017    Metabolic Disorder Labs:  Lab Results  Component Value Date   HGBA1C 4.7 (L) 12/09/2017   MPG 88.19 12/09/2017   Lab Results  Component Value Date   PROLACTIN 5.0 04/29/2014   PROLACTIN 46.1 (H) 12/17/2013   Lab Results  Component Value Date   CHOL 172 04/01/2018   TRIG 137 04/01/2018   HDL 33 (L) 04/01/2018   CHOLHDL 5.2 04/01/2018   VLDL 27 04/01/2018   LDLCALC 112 (H) 04/01/2018   LDLCALC 137 (H) 12/09/2017    Current Medications: Current Facility-Administered Medications  Medication Dose Route Frequency Provider Last Rate Last Dose  . acetaminophen (TYLENOL) tablet 650 mg  650 mg Oral Q6H PRN Nira Conn A, NP   650 mg at 04/01/18 0101  . alum & mag hydroxide-simeth (MAALOX/MYLANTA) 200-200-20 MG/5ML suspension 30 mL  30 mL Oral Q4H PRN Nira Conn A, NP      . hydrOXYzine (ATARAX/VISTARIL) tablet 25 mg  25 mg Oral TID PRN Nira Conn A, NP   25 mg at 04/01/18 0101  . magnesium hydroxide (MILK OF MAGNESIA) suspension 30 mL  30 mL Oral Daily PRN Nira Conn A, NP      . neomycin-bacitracin-polymyxin (NEOSPORIN) ointment packet   Topical TID Antonieta Pert, MD      . nicotine (NICODERM CQ - dosed in mg/24 hours) patch 21 mg  21 mg Transdermal Daily Cobos, Rockey Situ, MD   21 mg at 04/01/18 0102  . traZODone (DESYREL) tablet 50 mg  50 mg Oral QHS PRN Nira Conn A, NP      . venlafaxine XR (EFFEXOR-XR) 24 hr capsule 75 mg  75 mg Oral Q breakfast Antonieta Pert, MD      . ziprasidone (GEODON) injection 20 mg  20 mg Intramuscular Q6H PRN Antonieta Pert, MD       PTA Medications: Facility-Administered Medications Prior to Admission  Medication Dose Route Frequency Provider Last Rate Last Dose   . ARIPiprazole ER (ABILIFY MAINTENA) 400 MG prefilled syringe 400 mg  400 mg Intramuscular Q28 days Arfeen, Phillips Grout, MD   400 mg at 03/23/18 1014   Medications Prior to Admission  Medication Sig Dispense Refill Last Dose  . ARIPiprazole ER (ABILIFY MAINTENA) 400 MG PRSY prefilled syringe Inject into the muscle.     . hydrOXYzine (ATARAX/VISTARIL) 25 MG tablet Take 1 tablet (25 mg total) by mouth daily as needed for anxiety. (Patient not taking: Reported on 04/01/2018) 90 tablet 0 Not Taking at Unknown time  . pantoprazole (PROTONIX) 20 MG tablet Take 1 tablet (20 mg total) by mouth daily. (Patient not taking: Reported on 04/01/2018) 15 tablet 0 Not Taking at Unknown time  . venlafaxine XR (EFFEXOR-XR) 150 MG 24 hr capsule Take 1 capsule (150 mg  total) by mouth daily. (Patient not taking: Reported on 04/01/2018) 90 capsule 0 Not Taking at Unknown time    Musculoskeletal: Strength & Muscle Tone: within normal limits Gait & Station: normal Patient leans: N/A  Psychiatric Specialty Exam: Physical Exam  Nursing note and vitals reviewed. Constitutional: He is oriented to person, place, and time. He appears well-developed and well-nourished.  HENT:  Head: Normocephalic and atraumatic.  Respiratory: Effort normal.  Neurological: He is alert and oriented to person, place, and time.    ROS  Blood pressure 108/67, pulse (!) 113, temperature 99.3 F (37.4 C), temperature source Oral, resp. rate 18, height 5\' 7"  (1.702 m), weight 129.3 kg.Body mass index is 44.64 kg/m.  General Appearance: Disheveled  Eye Contact:  Minimal  Speech:  Normal Rate  Volume:  Decreased  Mood:  Angry, Anxious, Depressed and Irritable  Affect:  Congruent  Thought Process:  Coherent and Descriptions of Associations: Circumstantial  Orientation:  Full (Time, Place, and Person)  Thought Content:  Logical  Suicidal Thoughts:  Yes.  without intent/plan  Homicidal Thoughts:  No  Memory:  Immediate;   Fair Recent;    Fair Remote;   Fair  Judgement:  Impaired  Insight:  Lacking  Psychomotor Activity:  Increased  Concentration:  Concentration: Fair and Attention Span: Fair  Recall:  [employer] of Knowledge:  Fair  Language:  Good  Akathisia:  Negative  Handed:  Right  AIMS (if indicated):     Assets:  Desire for Improvement Physical Health Resilience  ADL's:  Intact  Cognition:  WNL  Sleep:  Number of Hours: 3.75    Treatment Plan Summary: Daily contact with patient to assess and evaluate symptoms and progress in treatment, Medication management and Plan Patient is seen and examined.  Patient is a 22 year old male with the above-stated past psychiatric history who was admitted for suicidal ideation.  He had an episode of extreme agitation, and fractured a window with his elbow.  He was given Geodon 20 mg IM which was effective in calming him.  He did receive the Abilify injection earlier this month.  He will be restarted on his Effexor extended release.  We will also continue the ziprasidone PRN for agitation.should this occur.  I will also write for Ativan 1 mg p.o. every 6 hours as needed agitation and anxiety as well.  We may have to send him to the emergency department in case there are any glass fragments in his elbow.  We will wait until he is calm enough for this.  His laboratories do show elevation of liver function enzymes, but unfortunately they did not do a blood alcohol.  His blood sugar is elevated at 178.  His creatinine is mildly elevated at 1.14.  He does have a history of sleep apnea, but apparently no diagnosis of diabetes at least at this point.  That will have to be evaluated.  Observation Level/Precautions:  Continuous Observation 15 minute checks  Laboratory:  Chemistry Profile HbAIC  Psychotherapy:    Medications:    Consultations:    Discharge Concerns:    Estimated LOS:  Other:     Physician Treatment Plan for Primary Diagnosis: <principal problem not specified> Long Term  Goal(s): Improvement in symptoms so as ready for discharge  Short Term Goals: Ability to identify changes in lifestyle to reduce recurrence of condition will improve, Ability to verbalize feelings will improve, Ability to disclose and discuss suicidal ideas, Ability to demonstrate self-control will improve, Ability to identify  and develop effective coping behaviors will improve and Ability to maintain clinical measurements within normal limits will improve  Physician Treatment Plan for Secondary Diagnosis: Active Problems:   Severe recurrent major depression without psychotic features (HCC)  Long Term Goal(s): Improvement in symptoms so as ready for discharge  Short Term Goals: Ability to identify changes in lifestyle to reduce recurrence of condition will improve, Ability to verbalize feelings will improve, Ability to disclose and discuss suicidal ideas, Ability to demonstrate self-control will improve, Ability to identify and develop effective coping behaviors will improve and Ability to maintain clinical measurements within normal limits will improve  I certify that inpatient services furnished can reasonably be expected to improve the patient's condition.    Antonieta Pert, MD    Addendum  Patient stated that he was concerned there might be glass fragments in his elbow.  There is mild swelling there from when he made contact with the window.  We discussed the possibility of going to the emergency room and having them explore the wound to make sure there were no glass fragments present.  After discussion with the patient he felt like he was too unstable today to go to the emergency room.  We discussed that and perhaps after he gets a days worth of medication and him, he is little bit calmer we can get him there.  In the meantime I will have the nursing staff wash it thoroughly, and we will place Neosporin ointment on it to make sure about infection.  12/15/20191:44 PM

## 2018-04-02 MED ORDER — TRAZODONE HCL 150 MG PO TABS
150.0000 mg | ORAL_TABLET | Freq: Every evening | ORAL | Status: DC | PRN
  Administered 2018-04-02 – 2018-04-03 (×2): 150 mg via ORAL
  Filled 2018-04-02: qty 1

## 2018-04-02 MED ORDER — TRAZODONE HCL 100 MG PO TABS
100.0000 mg | ORAL_TABLET | Freq: Once | ORAL | Status: AC
  Administered 2018-04-02: 100 mg via ORAL

## 2018-04-02 NOTE — Progress Notes (Signed)
Nursing 1:1 note D:Pt observed sleeping in bed with eyes closed , but was awake. RR even and unlabored. No distress noted. A: 1:1 observation continues for safety  R: pt remains safe

## 2018-04-02 NOTE — Progress Notes (Signed)
Nursing 1:1 note D:Pt observed sleeping in bed with eyes closed. RR even and unlabored. No distress noted. A: 1:1 observation continues for safety  R: pt remains safe  

## 2018-04-02 NOTE — Progress Notes (Signed)
[facility] MD Progress Note  04/02/2018 12:03 PM Andrew Noble  MRN:  [id number] Subjective:    Andrew Noble is known to the prior care he has chronic depressive symptoms some personality disorder is likely further he has been at previous facilities where I am seeing him. States he still has suicidal thinking and does not want to be unsupervised but there may be issues of secondary gain here At any rate will take him at his were to continue current precautions.  Meds are discussed no EPS or TD of course but does not desire changes at this point in time continue cognitive based therapy  Principal Problem: <principal problem not specified> Diagnosis: Active Problems:   Severe recurrent major depression without psychotic features (HCC)  Total Time spent with patient: 20 minutes   Past Medical History:  Past Medical History:  Diagnosis Date  . Abnormal laboratory test 02/12/2014  . Anxiety   . Bipolar disorder (HCC)   . Depression   . Hx of substance abuse (HCC) 02/12/2014  . Psychosis [facility])     Past Surgical History:  Procedure Laterality Date  . TONSILLECTOMY AND ADENOIDECTOMY     Family History:  Family History  Problem Relation Age of Onset  . Depression Mother   . Anxiety disorder Mother   . Bipolar disorder Maternal Aunt     Social History:  Social History   Substance and Sexual Activity  Alcohol Use Yes  . Alcohol/week: 1.0 - 2.0 standard drinks  . Types: 1 - 2 Cans of beer per week   Comment: rare     Social History   Substance and Sexual Activity  Drug Use Yes  . Types: Marijuana   Comment: no longer using    Social History   Socioeconomic History  . Marital status: Single    Spouse name: Not on file  . Number of children: Not on file  . Years of education: Not on file  . Highest education level: Not on file  Occupational History  . Not on file  Social Needs  . Financial resource strain: Not on file  . Food insecurity:    Worry: Not on file    Inability:  Not on file  . Transportation needs:    Medical: Not on file    Non-medical: Not on file  Tobacco Use  . Smoking status: Former Smoker    Packs/day: 0.25    Years: 1.00    Pack years: 0.25    Types: Cigarettes    Last attempt to quit: 03/28/2018    Years since quitting: 0.0  . Smokeless tobacco: Former Neurosurgeon    Types: Chew    Quit date: 03/28/2018  Substance and Sexual Activity  . Alcohol use: Yes    Alcohol/week: 1.0 - 2.0 standard drinks    Types: 1 - 2 Cans of beer per week    Comment: rare  . Drug use: Yes    Types: Marijuana    Comment: no longer using  . Sexual activity: Never  Lifestyle  . Physical activity:    Days per week: Not on file    Minutes per session: Not on file  . Stress: Not on file  Relationships  . Social connections:    Talks on phone: Not on file    Gets together: Not on file    Attends religious service: Not on file    Active member of club or organization: Not on file    Attends meetings of clubs or organizations: Not  on file    Relationship status: Not on file  Other Topics Concern  . Not on file  Social History Narrative  . Not on file   Additional Social History:    Pain Medications: see MAR Prescriptions: see MAR Over the Counter: see MAR                    Sleep: Fair  Appetite:  Fair  Current Medications: Current Facility-Administered Medications  Medication Dose Route Frequency Provider Last Rate Last Dose  . acetaminophen (TYLENOL) tablet 650 mg  650 mg Oral Q6H PRN Nira Conn A, NP   650 mg at 04/02/18 1042  . alum & mag hydroxide-simeth (MAALOX/MYLANTA) 200-200-20 MG/5ML suspension 30 mL  30 mL Oral Q4H PRN Nira Conn A, NP      . hydrOXYzine (ATARAX/VISTARIL) tablet 25 mg  25 mg Oral TID PRN Jackelyn Poling, NP   25 mg at 04/02/18 1610  . magnesium hydroxide (MILK OF MAGNESIA) suspension 30 mL  30 mL Oral Daily PRN Nira Conn A, NP      . neomycin-bacitracin-polymyxin (NEOSPORIN) ointment   Topical TID Antonieta Pert, MD      . nicotine (NICODERM CQ - dosed in mg/24 hours) patch 21 mg  21 mg Transdermal Daily Cobos, Rockey Situ, MD   21 mg at 04/02/18 9604  . traZODone (DESYREL) tablet 150 mg  150 mg Oral QHS PRN Malvin Johns, MD      . venlafaxine XR (EFFEXOR-XR) 24 hr capsule 75 mg  75 mg Oral Q breakfast Antonieta Pert, MD   75 mg at 04/02/18 0818  . ziprasidone (GEODON) injection 20 mg  20 mg Intramuscular Q6H PRN Antonieta Pert, MD        Lab Results:  Results for orders placed or performed during the hospital encounter of 03/31/18 (from the past 48 hour(s))  Comprehensive metabolic panel     Status: Abnormal   Collection Time: 04/01/18  7:18 AM  Result Value Ref Range   Sodium 140 135 - 145 mmol/L   Potassium 4.0 3.5 - 5.1 mmol/L   Chloride 104 98 - 111 mmol/L   CO2 22 22 - 32 mmol/L   Glucose, Bld 178 (H) 70 - 99 mg/dL   BUN 15 6 - 20 mg/dL   Creatinine, Ser 5.40 0.61 - 1.24 mg/dL   Calcium 9.3 8.9 - 98.1 mg/dL   Total Protein 7.0 6.5 - 8.1 g/dL   Albumin 4.1 3.5 - 5.0 g/dL   AST 51 (H) 15 - 41 U/L   ALT 90 (H) 0 - 44 U/L   Alkaline Phosphatase 60 38 - 126 U/L   Total Bilirubin 0.8 0.3 - 1.2 mg/dL   GFR calc non Af Amer >60 >60 mL/min   GFR calc Af Amer >60 >60 mL/min   Anion gap 14 5 - 15    Comment: Performed at [facility], [address], [place], [place] [address]  Hemoglobin A1c     Status: None   Collection Time: 04/01/18  7:18 AM  Result Value Ref Range   Hgb A1c MFr Bld 4.9 4.8 - 5.6 %    Comment: (NOTE) Pre diabetes:          5.7%-6.4% Diabetes:              >6.4% Glycemic control for   <7.0% adults with diabetes    Mean Plasma Glucose 93.93 mg/dL    Comment: Performed at  [facility], [address] [place], [place] [address]  Lipid panel     Status: Abnormal   Collection Time: 04/01/18  7:18 AM  Result Value Ref Range   Cholesterol 172 0 - 200 mg/dL   Triglycerides 191 <478 mg/dL   HDL 33 (L) >29 mg/dL   Total  CHOL/HDL Ratio 5.2 RATIO   VLDL 27 0 - 40 mg/dL   LDL Cholesterol 562 (H) 0 - 99 mg/dL    Comment:        Total Cholesterol/HDL:CHD Risk Coronary Heart Disease Risk Table                     Men   Women  1/2 Average Risk   3.4   3.3  Average Risk       5.0   4.4  2 X Average Risk   9.6   7.1  3 X Average Risk  23.4   11.0        Use the calculated Patient Ratio above and the CHD Risk Table to determine the patient's CHD Risk.        ATP III CLASSIFICATION (LDL):  <100     mg/dL   Optimal  130-865  mg/dL   Near or Above                    Optimal  130-159  mg/dL   Borderline  784-696  mg/dL   High  >295     mg/dL   Very High Performed at [facility], [address] [place], [place] [address]   TSH     Status: None   Collection Time: 04/01/18  7:18 AM  Result Value Ref Range   TSH 2.113 0.350 - 4.500 uIU/mL    Comment: Performed by a 3rd Generation assay with a functional sensitivity of <=0.01 uIU/mL. Performed at [facility], [address] [place], [place] [address]   CBC     Status: None   Collection Time: 04/01/18  7:18 AM  Result Value Ref Range   WBC 8.4 4.0 - 10.5 K/uL   RBC 5.33 4.22 - 5.81 MIL/uL   Hemoglobin 16.5 13.0 - 17.0 g/dL   HCT 02.7 25.3 - 66.4 %   MCV 93.6 80.0 - 100.0 fL   MCH 31.0 26.0 - 34.0 pg   MCHC 33.1 30.0 - 36.0 g/dL   RDW 40.3 47.4 - 25.9 %   Platelets 259 150 - 400 K/uL   nRBC 0.0 0.0 - 0.2 %    Comment: Performed at [facility], [address], [place], [place] [address]    Blood Alcohol level:  Lab Results  Component Value Date   ETH <10 10/04/2017    Metabolic Disorder Labs: Lab Results  Component Value Date   HGBA1C 4.9 04/01/2018   MPG 93.93 04/01/2018   MPG 88.19 12/09/2017   Lab Results  Component Value Date   PROLACTIN 5.0 04/29/2014   PROLACTIN 46.1 (H) 12/17/2013   Lab Results  Component Value Date   CHOL 172 04/01/2018   TRIG 137 04/01/2018   HDL  33 (L) 04/01/2018   CHOLHDL 5.2 04/01/2018   VLDL 27 04/01/2018   LDLCALC 112 (H) 04/01/2018   LDLCALC 137 (H) 12/09/2017    Physical Findings: AIMS: Facial and Oral Movements Muscles of Facial Expression: None, normal Lips and Perioral Area: None, normal Jaw: None, normal Tongue: None, normal,Extremity Movements Upper (arms,  wrists, hands, fingers): None, normal Lower (legs, knees, ankles, toes): None, normal, Trunk Movements Neck, shoulders, hips: None, normal, Overall Severity Severity of abnormal movements (highest score from questions above): None, normal Incapacitation due to abnormal movements: None, normal Patient's awareness of abnormal movements (rate only patient's report): No Awareness, Dental Status Current problems with teeth and/or dentures?: No Does patient usually wear dentures?: No  CIWA:  CIWA-Ar Total: 0 COWS:  COWS Total Score: 0  Musculoskeletal: Strength & Muscle Tone: within normal limits Gait & Station: normal Patient leans: N/A  Psychiatric Specialty Exam: Physical Exam  ROS  Blood pressure 121/72, pulse (!) 110, temperature 99.3 F (37.4 C), temperature source Oral, resp. rate 18, height 5\' 7"  (1.702 m), weight 129.3 kg.Body mass index is 44.64 kg/m.  General Appearance: Casual  Eye Contact:  Good  Speech:  Clear and Coherent  Volume:  Decreased  Mood:  Anxious and Depressed  Affect:  Appropriate  Thought Process:  Goal Directed  Orientation:  Full (Time, Place, and Person)  Thought Content:  Logical  Suicidal Thoughts:  Yes - cannot contract  Homicidal Thoughts:  No  Memory:  Immediate;   Good  Judgement:  Good  Insight:  Fair  Psychomotor Activity:  Normal  Concentration:  Concentration: Good  Recall:  Good  Fund of Knowledge:  Good  Language:  Good  Akathisia:  Negative  Handed:  Right  AIMS (if indicated):     Assets:  Communication Skills Leisure Time  ADL's:  Intact  Cognition:  WNL  Sleep:  Number of Hours: 4.5   In  summary patient continues to be somewhat attention seeking requesting to get a second interview after rounds continues to endorse suicidal thoughts without plans or intent but cannot contract fully for safety of left unsupervised, in the past we have considered him possibly factitious disorder with psychiatric symptoms given the level of pathology that is unresolved and frequently reported as well as possible personality disorder at any rate for these symptoms we will continue antidepressant therapy current precautions and reality and cognitive based therapy Treatment Plan Summary: Daily contact with patient to assess and evaluate symptoms and progress in treatment and Medication management  Malvin JohnsFARAH,Cyra Spader, MD 04/02/2018, 12:03 PM

## 2018-04-02 NOTE — Tx Team (Signed)
Interdisciplinary Treatment and Diagnostic Plan Update  04/02/2018 Time of Session: 9:00am Andrew Noble MRN: [id number]  Principal Diagnosis: <principal problem not specified>  Secondary Diagnoses: Active Problems:   Severe recurrent major depression without psychotic features (HCC)   Current Medications:  Current Facility-Administered Medications  Medication Dose Route Frequency Provider Last Rate Last Dose  . acetaminophen (TYLENOL) tablet 650 mg  650 mg Oral Q6H PRN Lindon Romp A, NP   650 mg at 04/01/18 2046  . alum & mag hydroxide-simeth (MAALOX/MYLANTA) 200-200-20 MG/5ML suspension 30 mL  30 mL Oral Q4H PRN Lindon Romp A, NP      . hydrOXYzine (ATARAX/VISTARIL) tablet 25 mg  25 mg Oral TID PRN Rozetta Nunnery, NP   25 mg at 04/02/18 3151  . magnesium hydroxide (MILK OF MAGNESIA) suspension 30 mL  30 mL Oral Daily PRN Lindon Romp A, NP      . neomycin-bacitracin-polymyxin (NEOSPORIN) ointment   Topical TID Sharma Covert, MD      . nicotine (NICODERM CQ - dosed in mg/24 hours) patch 21 mg  21 mg Transdermal Daily Cobos, Myer Peer, MD   21 mg at 04/02/18 7616  . traZODone (DESYREL) tablet 50 mg  50 mg Oral QHS PRN Lindon Romp A, NP   50 mg at 04/01/18 2047  . venlafaxine XR (EFFEXOR-XR) 24 hr capsule 75 mg  75 mg Oral Q breakfast Sharma Covert, MD   75 mg at 04/02/18 0818  . ziprasidone (GEODON) injection 20 mg  20 mg Intramuscular Q6H PRN Sharma Covert, MD       PTA Medications: Facility-Administered Medications Prior to Admission  Medication Dose Route Frequency Provider Last Rate Last Dose  . ARIPiprazole ER (ABILIFY MAINTENA) 400 MG prefilled syringe 400 mg  400 mg Intramuscular Q28 days Arfeen, Arlyce Harman, MD   400 mg at 03/23/18 1014   Medications Prior to Admission  Medication Sig Dispense Refill Last Dose  . ARIPiprazole ER (ABILIFY MAINTENA) 400 MG PRSY prefilled syringe Inject into the muscle.     . hydrOXYzine (ATARAX/VISTARIL) 25 MG tablet Take 1 tablet  (25 mg total) by mouth daily as needed for anxiety. (Patient not taking: Reported on 04/01/2018) 90 tablet 0 Not Taking at Unknown time  . pantoprazole (PROTONIX) 20 MG tablet Take 1 tablet (20 mg total) by mouth daily. (Patient not taking: Reported on 04/01/2018) 15 tablet 0 Not Taking at Unknown time  . venlafaxine XR (EFFEXOR-XR) 150 MG 24 hr capsule Take 1 capsule (150 mg total) by mouth daily. (Patient not taking: Reported on 04/01/2018) 90 capsule 0 Not Taking at Unknown time    Patient Stressors: Financial difficulties Marital or family conflict Medication change or noncompliance  Patient Strengths: Average or above average intelligence Capable of independent living Communication skills General fund of knowledge Motivation for treatment/growth Physical Health Supportive family/friends Work skills  Treatment Modalities: Medication Management, Group therapy, Case management,  1 to 1 session with clinician, Psychoeducation, Recreational therapy.   Physician Treatment Plan for Primary Diagnosis: <principal problem not specified> Long Term Goal(s): Improvement in symptoms so as ready for discharge Improvement in symptoms so as ready for discharge   Short Term Goals: Ability to identify changes in lifestyle to reduce recurrence of condition will improve Ability to verbalize feelings will improve Ability to disclose and discuss suicidal ideas Ability to demonstrate self-control will improve Ability to identify and develop effective coping behaviors will improve Ability to maintain clinical measurements within normal limits will improve Ability to  identify changes in lifestyle to reduce recurrence of condition will improve Ability to verbalize feelings will improve Ability to disclose and discuss suicidal ideas Ability to demonstrate self-control will improve Ability to identify and develop effective coping behaviors will improve Ability to maintain clinical measurements within  normal limits will improve  Medication Management: Evaluate patient's response, side effects, and tolerance of medication regimen.  Therapeutic Interventions: 1 to 1 sessions, Unit Group sessions and Medication administration.  Evaluation of Outcomes: Not Met  Physician Treatment Plan for Secondary Diagnosis: Active Problems:   Severe recurrent major depression without psychotic features ([place])  Long Term Goal(s): Improvement in symptoms so as ready for discharge Improvement in symptoms so as ready for discharge   Short Term Goals: Ability to identify changes in lifestyle to reduce recurrence of condition will improve Ability to verbalize feelings will improve Ability to disclose and discuss suicidal ideas Ability to demonstrate self-control will improve Ability to identify and develop effective coping behaviors will improve Ability to maintain clinical measurements within normal limits will improve Ability to identify changes in lifestyle to reduce recurrence of condition will improve Ability to verbalize feelings will improve Ability to disclose and discuss suicidal ideas Ability to demonstrate self-control will improve Ability to identify and develop effective coping behaviors will improve Ability to maintain clinical measurements within normal limits will improve     Medication Management: Evaluate patient's response, side effects, and tolerance of medication regimen.  Therapeutic Interventions: 1 to 1 sessions, Unit Group sessions and Medication administration.  Evaluation of Outcomes: Not Met   RN Treatment Plan for Primary Diagnosis: <principal problem not specified> Long Term Goal(s): Knowledge of disease and therapeutic regimen to maintain health will improve  Short Term Goals: Ability to remain free from injury will improve, Ability to verbalize frustration and anger appropriately will improve, Ability to demonstrate self-control, Ability to participate in decision  making will improve, Ability to verbalize feelings will improve, Ability to disclose and discuss suicidal ideas, Ability to identify and develop effective coping behaviors will improve and Compliance with prescribed medications will improve  Medication Management: RN will administer medications as ordered by provider, will assess and evaluate patient's response and provide education to patient for prescribed medication. RN will report any adverse and/or side effects to prescribing provider.  Therapeutic Interventions: 1 on 1 counseling sessions, Psychoeducation, Medication administration, Evaluate responses to treatment, Monitor vital signs and CBGs as ordered, Perform/monitor CIWA, COWS, AIMS and Fall Risk screenings as ordered, Perform wound care treatments as ordered.  Evaluation of Outcomes: Not Met   LCSW Treatment Plan for Primary Diagnosis: <principal problem not specified> Long Term Goal(s): Safe transition to appropriate next level of care at discharge, Engage patient in therapeutic group addressing interpersonal concerns.  Short Term Goals: Engage patient in aftercare planning with referrals and resources, Increase social support, Increase ability to appropriately verbalize feelings, Increase emotional regulation, Identify triggers associated with mental health/substance abuse issues and Increase skills for wellness and recovery  Therapeutic Interventions: Assess for all discharge needs, 1 to 1 time with Social worker, Explore available resources and support systems, Assess for adequacy in community support network, Educate family and significant other(s) on suicide prevention, Complete Psychosocial Assessment, Interpersonal group therapy.  Evaluation of Outcomes: Not Met   Progress in Treatment: Attending groups: No. Participating in groups: No. Taking medication as prescribed: Yes. Toleration medication: Yes. Family/Significant other contact made: No, will contact:  supports if  consent is granted Patient understands diagnosis: Yes. Discussing patient identified problems/goals with  staff: Yes. Medical problems stabilized or resolved: No. Denies suicidal/homicidal ideation: No.  New problem(s) identified: Yes, Describe:  patient on 1:1 for breaking window  New Short Term/Long Term Goal(s): medication management for mood stabilization; elimination of SI thoughts; development of comprehensive mental wellness/sobriety plan.  Patient Goals:  "Stay safe, figure out meds, figure out a plan to take my medications so I don't keep coming back here."  Discharge Plan or Barriers: CSW continuing to assess. [facility] pamphlet, Mobile Crisis information, and AA/NA information provided to patient for additional community support and resources.   Reason for Continuation of Hospitalization: Aggression Anxiety Delusions  Depression Mania Medication stabilization Suicidal ideation  Estimated Length of Stay: 3-5 days  Attendees: Patient: 04/02/2018 8:55 AM  Physician: Dr.Farah 04/02/2018 8:55 AM  Nursing:  04/02/2018 8:55 AM  RN Care Manager: 04/02/2018 8:55 AM  Social Worker: Stephanie Acre, [place] 04/02/2018 8:55 AM  Recreational Therapist:  04/02/2018 8:55 AM  Other:  04/02/2018 8:55 AM  Other:  04/02/2018 8:55 AM  Other: 04/02/2018 8:55 AM    Scribe for Treatment Team: Joellen [place], [place] 04/02/2018 8:55 AM

## 2018-04-02 NOTE — Care Plan (Signed)
Patient now states he can contract and he would like to be off one-to-one precautions alert oriented denying suicidal thoughts now and contracting fully

## 2018-04-02 NOTE — Plan of Care (Signed)
  Problem: Coping: Goal: Ability to verbalize frustrations and anger appropriately will improve Outcome: Progressing Goal: Ability to demonstrate self-control will improve Outcome: Progressing   D: Pt alert and oriented on the unit. Pt engaging with RN staff and other pts. Pt denies SI/HI, A/VH. Pt's affect was flat and mood depressed. Pt's 1:1 was discontinued, and he has been sitting in the day room watching television and talking with other pts on the unit. Pt is cooperative. A: Education, support and encouragement provided, q15 minute safety checks remain in effect. Medications administered per MD orders. R: No reactions/side effects to medicine noted. Pt denies any concerns at this time, and verbally contracts for safety. Pt ambulating on the unit with no issues. Pt remains safe on and off the unit.

## 2018-04-02 NOTE — Progress Notes (Signed)
Patient inquired about the possibility of returning to [address], states he is very anxious and "on edge" in [address] due to the acuity of other patients. Patient shared that the hit a window over the weekend, expressed some remorse, states it was in impulsive and poor decision. CSW offered support and encouragement.  Enid Cutterharlotte Domique Clapper, LCSW-A Clinical Social Worker

## 2018-04-02 NOTE — Progress Notes (Signed)
D: Pt denies SI/HI/AVH. Pt is pleasant and cooperative. Pt stated he was feeling better. Pt stated he thought about what Clinical research associatewriter and pt discussed yesterday. Pt was able to recall much of the information presented about his choices and how to make  Better choices that give him an opportunity for positive outcomes. Pt appeared to have insight into his Tx and appeared to want to learn how to make the changes for positive results.   A: Pt was offered support and encouragement. Pt was given sleep medications. Pt was encourage to attend groups. Q 15 minute checks were done for safety.   R:Pt attends groups and interacts well with peers and staff. Pt is taking medication. Pt has no complaints.Pt receptive to treatment and safety maintained on unit.   Problem: Education: Goal: Emotional status will improve Outcome: Progressing   Problem: Education: Goal: Mental status will improve Outcome: Progressing   Problem: Activity: Goal: Interest or engagement in activities will improve Outcome: Progressing   Problem: Coping: Goal: Ability to verbalize frustrations and anger appropriately will improve Outcome: Progressing   Problem: Coping: Goal: Ability to demonstrate self-control will improve Outcome: Progressing

## 2018-04-02 NOTE — Progress Notes (Signed)
Recreation Therapy Notes  Date: 12.16.19 Time: 1000 Location: [address] Dayroom  Group Topic: Anxiety  Goal Area(s) Addresses:  Patient will identify triggers for anxiety.  Patient will identify physical symptoms of anxiety. Patient will identify coping skills for anxiety.  Behavioral Response: Engaged  Intervention: Worksheet  Activity: Introduction to Anxiety.  Patients were to identify the triggers to their anxiety.  Pt were to also identify physical symptoms, thoughts and coping skills for anxiety.  Education: Anxiety, Discharge Planning  Education Outcome: Acknowledges understanding/In group clarification offered/Needs additional education.   Clinical Observations/Feedback: Pt identified his triggers as "past, present and future".  Pt stated some of his physical symptoms include racing thoughts, headaches and elevated heart rate/breathing.  Pt identified some of his thoughts as "what made me feel like this, who made me feel this way and when did I start to feel this way".  Pt expressed his coping skills were deep breaths, distract himself and redirect his thinking.     Caroll RancherMarjette Elmar [place], LRT/CTRS      Caroll RancherLindsay, Garrett Bowring A 04/02/2018 11:48 AM
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
## 2018-04-02 NOTE — Progress Notes (Signed)
1:1 RN note  D- Pt sat in the day room and watched television while interacting with staff and other pts. Pt appeared to be in no distress or discomfort.  A- 1:1 observation continues for pt's safety.  R- Pt remains safe on the unit.

## 2018-04-03 NOTE — Progress Notes (Signed)
Recreation Therapy Notes  Date: 12.17.19 Time: 1000 Location: 500 Hall Dayroom  Group Topic: Wellness  Goal Area(s) Addresses:  Patient will define components of whole wellness. Patient will verbalize benefit of whole wellness.  Behavioral Response: Engaged  Intervention: Music  Activity: Exercise.  LRT led group in a series of stretches before going into exercises.  Once the stretching was completed, each patient got the opportunity to lead the group in multiple exercises.  Group was given water breaks and told to sit if they felt dizzy at any moment.  Education:Wellness, Discharge Planning.   Education Outcome: Acknowledges education/In group clarification offered/Needs additional education.   Clinical Observations/Feedback: Pt was smiling and active during group session.  Pt lead group in leg raises and arm exercises.  Pt was engaged throughout.      Caroll RancherMarjette Shalyn Koral, LRT/CTRS     Lillia AbedLindsay, Donnielle Addison A 04/03/2018 11:26 AM

## 2018-04-03 NOTE — Plan of Care (Signed)
Progress note  D: pt found in bed; compliant with medication administration. Pt states he slept well. Pt denies any depression/hopelessness/anxiety, rating these all 0/10. Pt states he is having symptoms of agitation and irritability. Pt states he is having right heel pain that he rates at a 4/10. Pt denied medication for this. Pt states his goal for today is to figure out what my goals are when I get out of here. Pt will achieve this by writing down his feelings and thought processes. Pt denies any si/hi/ah/vh and verbally agrees to approach staff if these become apparent or before harm[facility]. A: pt provided support and encouragement. Pt given medication per protocol and standing orders. [id number] safety checks implemented and continued.  R: pt safe on the unit. Will continue to monitor.   Pt progressing in the following metrics  Problem: Education: Goal: Knowledge of [facility] Education information/materials will improve Outcome: Progressing   Problem: Health Behavior/Discharge Planning: Goal: Identification of resources available to assist in meeting health care needs will improve Outcome: Progressing   Problem: Safety: Goal: Periods of time without injury will increase Outcome: Progressing   Problem: Safety: Goal: Ability to identify and utilize support systems that promote safety will improve Outcome: Progressing

## 2018-04-03 NOTE — Progress Notes (Signed)
Adult Psychoeducational Group Note  Date:  04/03/2018 Time:  9:05 PM  Group Topic/Focus:  Wrap-Up Group:   The focus of this group is to help patients review their daily goal of treatment and discuss progress on daily workbooks.  Participation Level:  Active  Participation Quality:  Appropriate  Affect:  Appropriate  Cognitive:  Appropriate  Insight: Appropriate  Engagement in Group:  Engaged  Modes of Intervention:  Discussion  Additional Comments: The patient expressed that he rates today a 9.The patient also said that he attended group.  Octavio Mannshigpen, Quincy Prisco Lee 04/03/2018, 9:05 PM

## 2018-04-03 NOTE — Progress Notes (Signed)
[facility] MD Progress Note  04/03/2018 10:07 AM Andrew Noble  MRN:  [id number] Subjective:    Patient reports overall improvement is requesting discharge tomorrow denies wanting to harm self now can contract here and if he leaves.  History of possible factitious symptoms history of male borderline personality disorder but at any rate we think he is showing enough improvement that he can go tomorrow  Principal Problem:  suicidal on presentation Diagnosis: Active Problems:   Severe recurrent major depression without psychotic features (HCC)  Total Time spent with patient: 20 minutes  Past Medical History:  Past Medical History:  Diagnosis Date  . Abnormal laboratory test 02/12/2014  . Anxiety   . Bipolar disorder (HCC)   . Depression   . Hx of substance abuse (HCC) 02/12/2014  . Psychosis [facility])     Past Surgical History:  Procedure Laterality Date  . TONSILLECTOMY AND ADENOIDECTOMY     Family History:  Family History  Problem Relation Age of Onset  . Depression Mother   . Anxiety disorder Mother   . Bipolar disorder Maternal Aunt     Social History:  Social History   Substance and Sexual Activity  Alcohol Use Yes  . Alcohol/week: 1.0 - 2.0 standard drinks  . Types: 1 - 2 Cans of beer per week   Comment: rare     Social History   Substance and Sexual Activity  Drug Use Yes  . Types: Marijuana   Comment: no longer using    Social History   Socioeconomic History  . Marital status: Single    Spouse name: Not on file  . Number of children: Not on file  . Years of education: Not on file  . Highest education level: Not on file  Occupational History  . Not on file  Social Needs  . Financial resource strain: Not on file  . Food insecurity:    Worry: Not on file    Inability: Not on file  . Transportation needs:    Medical: Not on file    Non-medical: Not on file  Tobacco Use  . Smoking status: Former Smoker    Packs/day: 0.25    Years: 1.00    Pack years:  0.25    Types: Cigarettes    Last attempt to quit: 03/28/2018    Years since quitting: 0.0  . Smokeless tobacco: Former Neurosurgeon    Types: Chew    Quit date: 03/28/2018  Substance and Sexual Activity  . Alcohol use: Yes    Alcohol/week: 1.0 - 2.0 standard drinks    Types: 1 - 2 Cans of beer per week    Comment: rare  . Drug use: Yes    Types: Marijuana    Comment: no longer using  . Sexual activity: Never  Lifestyle  . Physical activity:    Days per week: Not on file    Minutes per session: Not on file  . Stress: Not on file  Relationships  . Social connections:    Talks on phone: Not on file    Gets together: Not on file    Attends religious service: Not on file    Active member of club or organization: Not on file    Attends meetings of clubs or organizations: Not on file    Relationship status: Not on file  Other Topics Concern  . Not on file  Social History Narrative  . Not on file   Additional Social History:    Pain Medications: see MAR  Prescriptions: see MAR Over the Counter: see MAR                    Sleep: Fair  Appetite:  Fair  Current Medications: Current Facility-Administered Medications  Medication Dose Route Frequency Provider Last Rate Last Dose  . acetaminophen (TYLENOL) tablet 650 mg  650 mg Oral Q6H PRN Berry, Jason A, NP   650 mg at 04/02/18 1042  . alum & mag hydroxide-simeth (MAALOX/MYLANTA) 200-200-Nira Conn20 MG/5ML suspension 30 mL  30 mL Oral Q4H PRN Nira ConnBerry, Jason A, NP      . hydrOXYzine (ATARAX/VISTARIL) tablet 25 mg  25 mg Oral TID PRN Nira ConnBerry, Jason A, NP   25 mg at 04/03/18 0750  . magnesium hydroxide (MILK OF MAGNESIA) suspension 30 mL  30 mL Oral Daily PRN Nira ConnBerry, Jason A, NP      . neomycin-bacitracin-polymyxin (NEOSPORIN) ointment   Topical TID Antonieta Pertlary, Greg Lawson, MD      . nicotine (NICODERM CQ - dosed in mg/24 hours) patch 21 mg  21 mg Transdermal Daily Cobos, Rockey SituFernando A, MD   Stopped at 04/03/18 0751  . traZODone (DESYREL) tablet 150  mg  150 mg Oral QHS PRN Malvin JohnsFarah, Mozes Sagar, MD   150 mg at 04/02/18 2214  . venlafaxine XR (EFFEXOR-XR) 24 hr capsule 75 mg  75 mg Oral Q breakfast Antonieta Pertlary, Greg Lawson, MD   75 mg at 04/03/18 0750  . ziprasidone (GEODON) injection 20 mg  20 mg Intramuscular Q6H PRN Antonieta Pertlary, Greg Lawson, MD        Lab Results: No results found for this or any previous visit (from the past 48 hour(s)).  Blood Alcohol level:  Lab Results  Component Value Date   ETH <10 10/04/2017    Metabolic Disorder Labs: Lab Results  Component Value Date   HGBA1C 4.9 04/01/2018   MPG 93.93 04/01/2018   MPG 88.19 12/09/2017   Lab Results  Component Value Date   PROLACTIN 5.0 04/29/2014   PROLACTIN 46.1 (H) 12/17/2013   Lab Results  Component Value Date   CHOL 172 04/01/2018   TRIG 137 04/01/2018   HDL 33 (L) 04/01/2018   CHOLHDL 5.2 04/01/2018   VLDL 27 04/01/2018   LDLCALC 112 (H) 04/01/2018   LDLCALC 137 (H) 12/09/2017    Physical Findings: AIMS: Facial and Oral Movements Muscles of Facial Expression: None, normal Lips and Perioral Area: None, normal Jaw: None, normal Tongue: None, normal,Extremity Movements Upper (arms, wrists, hands, fingers): None, normal Lower (legs, knees, ankles, toes): None, normal, Trunk Movements Neck, shoulders, hips: None, normal, Overall Severity Severity of abnormal movements (highest score from questions above): None, normal Incapacitation due to abnormal movements: None, normal Patient's awareness of abnormal movements (rate only patient's report): No Awareness, Dental Status Current problems with teeth and/or dentures?: No Does patient usually wear dentures?: No  CIWA:  CIWA-Ar Total: 0 COWS:  COWS Total Score: 0  Musculoskeletal: Strength & Muscle Tone: within normal limits Gait & Station: normal  Psychiatric Specialty Exam: Physical Exam  ROS  Blood pressure 114/63, pulse (!) 106, temperature 99.3 F (37.4 C), temperature source Oral, resp. rate 18, height 5\' 7"   (1.702 m), weight 129.3 kg.Body mass index is 44.64 kg/m.  General Appearance: Casual  Eye Contact:  Good  Speech:  Clear and Coherent  Volume:  Decreased  Mood:  Dysphoric  Affect:  Blunt  Thought Process:  Coherent  Orientation:  Full (Time, Place, and Person)  Thought Content:  Logical  Suicidal Thoughts:  No  Homicidal Thoughts:  No  Memory:  Immediate;   Fair  Judgement:  Fair  Insight:  Fair  Psychomotor Activity:  Normal  Concentration:  Concentration: Fair  Recall:  [employer] of Knowledge:  Fair  Language:  Fair  Akathisia:  Negative  Handed:  Right  AIMS (if indicated):     Assets:  Communication Skills Desire for Improvement  ADL's:  Intact  Cognition:  WNL  Sleep:  Number of Hours: 5   Continue cognitive therapy and medications for depression and suicidality continue current precautions probable discharge tomorrow  Treatment Plan Summary: Daily contact with patient to assess and evaluate symptoms and progress in treatment and Medication management  Frandy Basnett, MD 04/03/2018, 10:07 AM

## 2018-04-03 NOTE — [facility] Suicide Risk Assessment (Addendum)
[facility] INPATIENT:  Family/Significant Other Suicide Prevention Education  Suicide Prevention Education:  Contact Attempts: with mother, Aldean BakerLaura Levin [phone]  has been identified by the patient as the family member/significant other with whom the patient will be residing, and identified as the person(s) who will aid the patient in the event of a mental health crisis.  With written consent from the patient, two attempts were made to provide suicide prevention education, prior to and/or following the patient's discharge.  We were unsuccessful in providing suicide prevention education.  A suicide education pamphlet was given to the patient to share with family/significant other.  Date and time of first attempt: 04/03/18 at 3:50pm. Left voicemail.  Date and time of second attempt: 04/04/18 at 8:55AM  Vanisha Whiten S. Alan RipperHolloway, MSW, LCSW Clinical Social Worker 04/04/2018 8:57 AM    Darreld Mcleanharlotte C Hoy 04/03/2018, 3:54 PM

## 2018-04-04 MED ORDER — VENLAFAXINE HCL ER 75 MG PO CP24: 75.0000 mg | ORAL_CAPSULE | Freq: Every day | ORAL | 0 refills | Status: DC

## 2018-04-04 MED ORDER — NICOTINE 21 MG/24HR TD PT24: 21.0000 mg | MEDICATED_PATCH | Freq: Every day | TRANSDERMAL | 0 refills | Status: DC

## 2018-04-04 MED ORDER — HYDROXYZINE HCL 25 MG PO TABS: 25.0000 mg | ORAL_TABLET | Freq: Three times a day (TID) | ORAL | 0 refills | Status: DC | PRN

## 2018-04-04 MED ORDER — TRAZODONE HCL 150 MG PO TABS: 150.0000 mg | ORAL_TABLET | Freq: Every evening | ORAL | 0 refills | Status: DC | PRN

## 2018-04-04 NOTE — Progress Notes (Signed)
D:  Andrew Noble was up and visible on the unit.  He attended evening wrap up group.  He was interacting well with staff and peers.  He denied SI/HI or A/V hallucinations.  He denied any pain or discomfort and appeared to be in no physical distress.  Later in the evening, he stated he wasn't feeling well, felt dizzy and unable to put his thoughts together although was able to answer questions appropriately when asked.  PRN vistaril given with his trazodone and was later noted falling asleep in the day room.  He was encouraged to go to bed and is currently resting with his eyes closed and appears to be asleep. A:  1:1 with RN for support and encouragement.  Medications as ordered and prn.  Q 15 minute checks maintained for safety.  Encouraged participation in group and unit activities.   R:  Andrew Noble remains safe on the unit.  We will continue to monitor the progress towards his goals.

## 2018-04-04 NOTE — Plan of Care (Signed)
  Problem: Education: Goal: Emotional status will improve Outcome: Progressing Note:  Andrew MaduroRobert reported that he was feeling better and that he had a good day.

## 2018-04-04 NOTE — Progress Notes (Addendum)
  [facility] Management Discharge Plan :  Will you be returning to the same living situation after discharge:  Yes,  home At discharge, do you have transportation home?: Yes,  family member  Do you have the ability to pay for your medications: Yes,  [employer] insurance  Release of information consent forms completed and submitted to medical records by CSW.  Patient to Follow up at: Follow-up Information    [facility] Go on 04/12/2018.   Why:  Your next therapy appointment with Cam is Thursday, 04/12/18 at 10:00a. Your next medication management appointment with Elon JesterMichele is Friday, 04/13/18 at 10:00a.  Please call within 24 hours of discharge to hold appointments and pay the $20 deposit.  Contact information: [address] [place] [id number] [phone]           Next level of care provider has access to [facility] Link:no  Safety Planning and Suicide Prevention discussed: Yes,  SPE completed with pt; contact attempts made with pt's mother. SPI pamphlet and mobile crisis information provided.   Have you used any form of tobacco in the last 30 days? (Cigarettes, Smokeless Tobacco, Cigars, and/or Pipes): Yes  Has patient been referred to the Quitline?: Patient refused referral  Patient has been referred for addiction treatment: Yes  Rona RavensHeather S Lariza Cothron, LCSW 04/04/2018, 1:07 PM

## 2018-04-04 NOTE — Discharge Summary (Signed)
Physician Discharge Summary Note  Patient:  Andrew Noble is an 22 y.o., male  MRN:  [id number]  DOB:  Dec 23, 1995  Patient phone:  [phone] (home)   Patient address:   [address] [place] [address],   Total Time spent with patient: Greater than 30 minutes  Date of Admission:  03/31/2018  Date of Discharge: 04-04-18  Reason for Admission: Suicidal ideation & "if I had a gun I would shoot myself, but I do not have ".   Principal Problem: Severe recurrent major depression without psychotic features [facility])  Discharge Diagnoses: Principal Problem:   Severe recurrent major depression without psychotic features [facility])  Past Psychiatric History: Major depression.  Past Medical History:  Past Medical History:  Diagnosis Date  . Abnormal laboratory test 02/12/2014  . Anxiety   . Bipolar disorder (HCC)   . Depression   . Hx of substance abuse (HCC) 02/12/2014  . Psychosis [facility])     Past Surgical History:  Procedure Laterality Date  . TONSILLECTOMY AND ADENOIDECTOMY     Family History:  Family History  Problem Relation Age of Onset  . Depression Mother   . Anxiety disorder Mother   . Bipolar disorder Maternal Aunt    Family Psychiatric  History: See H&P  Social History:  Social History   Substance and Sexual Activity  Alcohol Use Yes  . Alcohol/week: 1.0 - 2.0 standard drinks  . Types: 1 - 2 Cans of beer per week   Comment: rare     Social History   Substance and Sexual Activity  Drug Use Yes  . Types: Marijuana   Comment: no longer using    Social History   Socioeconomic History  . Marital status: Single    Spouse name: Not on file  . Number of children: Not on file  . Years of education: Not on file  . Highest education level: Not on file  Occupational History  . Not on file  Social Needs  . Financial resource strain: Not on file  . Food insecurity:    Worry: Not on file    Inability: Not on file  . Transportation needs:    Medical:  Not on file    Non-medical: Not on file  Tobacco Use  . Smoking status: Former Smoker    Packs/day: 0.25    Years: 1.00    Pack years: 0.25    Types: Cigarettes    Last attempt to quit: 03/28/2018    Years since quitting: 0.0  . Smokeless tobacco: Former Neurosurgeon    Types: Chew    Quit date: 03/28/2018  Substance and Sexual Activity  . Alcohol use: Yes    Alcohol/week: 1.0 - 2.0 standard drinks    Types: 1 - 2 Cans of beer per week    Comment: rare  . Drug use: Yes    Types: Marijuana    Comment: no longer using  . Sexual activity: Never  Lifestyle  . Physical activity:    Days per week: Not on file    Minutes per session: Not on file  . Stress: Not on file  Relationships  . Social connections:    Talks on phone: Not on file    Gets together: Not on file    Attends religious service: Not on file    Active member of club or organization: Not on file    Attends meetings of clubs or organizations: Not on file    Relationship status: Not on file  Other  Topics Concern  . Not on file  Social History Narrative  . Not on file   Hospital Course: (Per Md's admission evaluation): Patient is a 22 year old male with a past psychiatric history significant for borderline personality disorder, bipolar disorder either type I or type II, and possible posttraumatic stress disorder who presented to the behavioral health hospital as a direct admission. He stated that he was having suicidal ideation, and "if I had a gun I would shoot myself, but I do not have one. The patient's last admission to a psychiatric facility was on 12/22/2017 at [facility]. He was discharged at that time on Abilify 10 mg p.o. daily, Effexor long-acting 150 mg p.o. daily, trazodone 50 mg p.o. nightly.The patient stated that he had stopped taking his medicines approximately a month ago. He did receive his long-acting Abilify injection on 12/6. He stated that he is sick and tired of being admitted to the  hospital. He sees all of this is being futile. He stated he does not want to take medication, and all he wants to do is ended all. He stated he has been told by one doctor that he should have "transcranial magnetic stimulation", but otherwise he sees no into it except his death. His last psychiatric hospitalization at our facility was on 12/07/2017. He was discharged on hydroxyzine, pantoprazole, trazodone, venlafaxine extended release. 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 He informed who he thought was appropriate staff, but was then punished for "spreading gossip". He stated he is tired of just going to work, coming home, going to bed. He stated he is tired of just barely making ends meet. Shortly after the interview he went to his room, and became significantly agitated. He kicked an outside door briskly on several occasions, and then went into his room and slammed his elbow into a window which was then fractured. He was transferred to the 500 hall, and given Geodon. After he calmed down he was reassessed, and was remorseful about his behavior. He was admitted to the hospital for evaluation and stabilization.  This is one of several discharge summaries for this 22 year old Caucasian male with previous hx of mental illness & several psychiatric hospitalizations both at [facility] & other surrounding psychiatric hospitals. He was apparently discharged from another psychiatric hospital in a nearby city here in [place] 3 months ago. He was also on a monthly antipsychotic injectable. Chart review indicated that this patient in known to be non-compliant to his treatment regimen. He was admitted to the [facility], this time around for suicidal ideations & a threat to kill himself if he a gun. He was recommended for mood stabilization treatments.    After evaluation of his presenting symptoms, the medication regimen for stabilizing  his symptoms were discussed & with his consent, initiated. Escher was treated & discharged on; Effexor XR 175 mg for depression, Hydroxyzine 25 mg prn for anxiety, Nicotine patch 21 mg for smoking cessation & Trazodone 150 mg prn for insomnia. He was also enrolled & participated in the group counseling sessions being offered & held on this unit. He learned coping skills. he presented no other significant medical issues that required treatment & or monitoring. Pesach tolerated his treatment regimen without any adverse effects or reactions reported. Farid's symptoms responded well to his treatment regimen. This is evidenced by his reports of improved mood, resolution of symptoms & presentation of  good affect. He presents currently mentally & medically stable for discharge.   Today upon his discharge evaluation with the attending psychiatrist today, pt shares, "I'm doing good. I feel much better". He denies any specific concerns. He is sleeping well. His appetite is good. He denies other physical complaints. He denies SI/HI/AH/VH. he is tolerating her medications well and is in agreement to continue his current regimen without changes. He will have follow up care for routine psychiatric care & medication management on an outpatient basis as noted below. He was able to engage in safety planning including plan to [facility] or contact emergency services if he feels unable to maintain his own safety or the safety of others. Pt had no further questions, comments or concerns.    This patient is currently at low risk of imminent suicide. Patient denies thoughts, intent, or plan for harm to himself or others, expressed significant future orientation, and expressed an ability to mobilize assistance for his needs. He is presently void of any contributing psychiatric symptoms, cognitive difficulties, or substance use which would elevate her risk for lethality. Chronic risk for lethality is elevated in light of poor  social support, poor adherence, and impulsivity. Modifiable risk factors were addressed during this hospitalization through appropriate pharmacotherapy and establishment of outpatient follow-up treatment. Some risk factors for suicide are situational (i.e. Unstable social support) or related personality pathology (i.e. Poor coping mechanisms) and thus cannot be further mitigated by continued hospitalization in this setting. [facility] belongings in no apparent distress.  Physical Findings: AIMS: Facial and Oral Movements Muscles of Facial Expression: None, normal Lips and Perioral Area: None, normal Jaw: None, normal Tongue: None, normal,Extremity Movements Upper (arms, wrists, hands, fingers): None, normal Lower (legs, knees, ankles, toes): None, normal, Trunk Movements Neck, shoulders, hips: None, normal, Overall Severity Severity of abnormal movements (highest score from questions above): None, normal Incapacitation due to abnormal movements: None, normal Patient's awareness of abnormal movements (rate only patient's report): No Awareness, Dental Status Current problems with teeth and/or dentures?: No Does patient usually wear dentures?: No  CIWA:  CIWA-Ar Total: 0 COWS:  COWS Total Score: 0  Musculoskeletal: Strength & Muscle Tone: within normal limits Gait & Station: normal Patient leans: N/A  Psychiatric Specialty Exam: Physical Exam  Nursing note and vitals reviewed. Constitutional: He is oriented to person, place, and time. He appears well-developed.  HENT:  Head: Normocephalic.  Eyes: Pupils are equal, round, and reactive to light.  Neck: Normal range of motion.  Cardiovascular: Normal rate.  Respiratory: Effort normal.  GI: Soft.  Genitourinary:    Genitourinary Comments: Deferred   Musculoskeletal: Normal range of motion.  Neurological: He is alert and oriented to person, place, and time.  Skin: Skin is warm.    Review of Systems   Constitutional: Negative.   HENT: Negative.   Eyes: Negative.   Respiratory: Negative.  Negative for cough and shortness of breath.   Cardiovascular: Negative.  Negative for chest pain and palpitations.  Gastrointestinal: Negative.  Negative for abdominal pain, heartburn, nausea and vomiting.  Genitourinary: Negative.   Musculoskeletal: Negative.   Skin: Negative.   Neurological: Negative.  Negative for dizziness and headaches.  Endo/Heme/Allergies: Negative.   Psychiatric/Behavioral: Positive for depression (Stable) and hallucinations (Hx. psychosis (stable)). Negative for memory loss, substance abuse and suicidal ideas. The patient has insomnia (Stable). The patient is not nervous/anxious (Stable).     Blood pressure 109/82, pulse (!) 114, temperature 98.2 F (36.8 C),  temperature source Oral, resp. rate 18, height 5\' 7"  (1.702 m), weight 129.3 kg.Body mass index is 44.64 kg/m.  See Md's discharge SRA   Have you used any form of tobacco in the last 30 days? (Cigarettes, Smokeless Tobacco, Cigars, and/or Pipes): Yes  Has this patient used any form of tobacco in the last 30 days? (Cigarettes, Smokeless Tobacco, Cigars, and/or Pipes): Yes,  an FDA-approved tobacco cessation medication was offered at discharge.  Blood Alcohol level:  Lab Results  Component Value Date   ETH <10 10/04/2017   Metabolic Disorder Labs:  Lab Results  Component Value Date   HGBA1C 4.9 04/01/2018   MPG 93.93 04/01/2018   MPG 88.19 12/09/2017   Lab Results  Component Value Date   PROLACTIN 5.0 04/29/2014   PROLACTIN 46.1 (H) 12/17/2013   Lab Results  Component Value Date   CHOL 172 04/01/2018   TRIG 137 04/01/2018   HDL 33 (L) 04/01/2018   CHOLHDL 5.2 04/01/2018   VLDL 27 04/01/2018   LDLCALC 112 (H) 04/01/2018   LDLCALC 137 (H) 12/09/2017   See Psychiatric Specialty Exam and Suicide Risk Assessment completed by Attending Physician prior to discharge.  Discharge destination:  Home  Is  patient on multiple antipsychotic therapies at discharge:  No   Has Patient had three or more failed trials of antipsychotic monotherapy by history:  No  Recommended Plan for Multiple Antipsychotic Therapies: NA  Allergies as of 04/04/2018   No Known Allergies     Medication List    STOP taking these medications   ARIPiprazole ER 400 MG Prsy prefilled syringe Commonly known as:  ABILIFY MAINTENA   pantoprazole 20 MG tablet Commonly known as:  PROTONIX     TAKE these medications     Indication  hydrOXYzine 25 MG tablet Commonly known as:  ATARAX/VISTARIL Take 1 tablet (25 mg total) by mouth 3 (three) times daily as needed for anxiety. What changed:  when to take this  Indication:  Feeling Anxious   nicotine 21 mg/24hr patch Commonly known as:  NICODERM CQ - dosed in mg/24 hours Place 1 patch (21 mg total) onto the skin daily. (May buy from over the shelter): For smoking cessation Start taking on:  April 05, 2018  Indication:  Nicotine Addiction   traZODone 150 MG tablet Commonly known as:  DESYREL Take 1 tablet (150 mg total) by mouth at bedtime as needed for sleep.  Indication:  Trouble Sleeping   venlafaxine XR 75 MG 24 hr capsule Commonly known as:  EFFEXOR-XR Take 1 capsule (75 mg total) by mouth daily with breakfast. For depression Start taking on:  April 05, 2018 What changed:    medication strength  how much to take  when to take this  additional instructions  Indication:  Major Depressive Disorder      Follow-up Information    [facility]/2018.   Why:  Your next therapy appointment with Cam is Thursday, 04/12/18 at 10:00a. Your next medication management appointment with Elon Jester is Friday, 04/13/18 at 10:00a.  Please call within 24 hours of discharge to hold appointments and pay the $20 deposit.  Contact information: [address] [place] [address] [phone]          Follow-up recommendations:  Activity:  As tolerated Diet: As recommended by your primary care doctor. Keep all scheduled follow-up appointments as recommended.   Comments: Patient is instructed prior to discharge to: Take all medications as prescribed by his/her mental healthcare  provider. Report any adverse effects and or reactions from the medicines to his/her outpatient provider promptly. Patient has been instructed & cautioned: To not engage in alcohol and or illegal drug use while on prescription medicines. In the event of worsening symptoms, patient is instructed to call the crisis hotline, 911 and or go to the nearest ED for appropriate evaluation and treatment of symptoms. To follow-up with his/her primary care provider for your other medical issues, concerns and or health care needs.   Signed: Armandina Stammer, NP, PMHNP, FNP-BC 04/04/2018, 1:44 PM

## 2018-04-04 NOTE — Therapy (Signed)
Occupational Therapy Group Note  Date:  04/04/2018 Time:  11:14 AM  Group Topic/Focus:  Stress Management  Participation Level:  Minimal  Participation Quality:  Drowsy  Affect:  Blunted  Cognitive:  Appropriate  Insight: Improving  Engagement in Group:  Developing/Improving  Modes of Intervention:  Activity, Discussion, Education and Socialization  Additional Comments:    S: "I know how to fold other things, like how to make a boat"  O: Education given on stress management and healthy coping mechanisms. Pt encouraged to brainstorm with other peers and discuss what has worked in the past vs what has not. Pts further encouraged to discuss new coping stress management strategies to implement this date. Art activity made to display preferred coping mechanisms, along with incorporating the stress management outlet of coloring/art.   A: Pt presents to group with blunted affect, initially very drowsy and sleeping through beginning of group. Pt then awakened and began to participate fully in activity. Pt did not share his preferred stress management, but wrote down coloring and reading. Pt engaged in art activity with success and then began folding other shapes like boats, etc.  P: Pt provided with education on stress management activities to implement into daily routine. Handouts given to facilitate carryover when reintegrating into community   Dalphine HandingKaylee Kristien Salatino, MSOT, OTR/L [facility] OT/ Acute Relief OT PHP Office: [phone]  Dalphine HandingKaylee Lamar Naef 04/04/2018, 11:14 AM

## 2018-04-04 NOTE — [facility] Suicide Risk Assessment (Signed)
[facility]   Principal Problem: Recurrent and treatment resistant suicidal reports and depression Discharge Diagnoses: Active Problems:   Severe recurrent major depression without psychotic features (HCC)   Total Time spent with patient: 45 minutes   Mental Status Per Nursing Assessment::   On Admission:  Suicidal ideation indicated by patient, Suicide plan, Plan includes specific time, place, or method, Self-harm thoughts, Belief that plan would result in death  Demographic Factors:  Male  Loss Factors: Decrease in vocational status  Historical Factors: Prior suicide attempts  Risk Reduction Factors:   Religious beliefs about death  Continued Clinical Symptoms:  Depression:   Severe  Cognitive Features That Contribute To Risk:  None    Suicide Risk:  Minimal: No identifiable suicidal ideation.  Patients presenting with no risk factors but with morbid ruminations; may be classified as minimal risk based on the severity of the depressive symptoms  Follow-up Information    [facility] Go on 04/12/2018.   Why:  Your next therapy appointment with Cam is Thursday, 04/12/18 at 10:00a. Your next medication management appointment with Elon JesterMichele is Friday, 02/11/18 at 10:00a.  Please call within 24 hours of discharge to hold appointments and pay the $20 deposit.  Contact information: [address] [place] [id number] [phone]           Plan Of Care/Follow-up recommendations:  Activity:  full  Sher Hellinger, MD 04/04/2018, 9:19 AM

## 2018-04-04 NOTE — Progress Notes (Signed)
Pt received both written and verbal discharge instructions. Pt verbalized understanding of discharge instructions. Pt agreed to f/u appt and med regimen. Pt received prescriptions, SRA, AVS suicide prevention sheet and a transitional record. Pt gathered belongings from room and locker. Pt safely discharged to the lobby.  Writer notified CSW to provide pt with a letter for work.

## 2018-04-04 NOTE — Progress Notes (Signed)
Recreation Therapy Notes  Date: 12.18.19 Time: 1000 Location: 500 Hall  Group Topic: Team Building, Problem Solving  Goal Area(s) Addresses:  Patient will effectively work with peer towards shared goal.  Patient will identify skill used to make activity successful.  Patient will identify how skills used during activity can be used to reach post d/c goals.   Intervention: STEM Activity   Activity:  [place] in [employer].  As a group, each person was given a rubber circle and one extra for the group.  Patients were to move from one end of the hall to the other and back using the rubber discs.  If anyone stepped off of their disc, the group would have to start over.    Education: Pharmacist, communityocial Skills, Building control surveyorDischarge Planning.   Education Outcome: Acknowledges education/In group clarification offered/Needs additional education.   Clinical Observations/Feedback: Pt did not attend group.     Caroll RancherMarjette Rosenda Geffrard, LRT/CTRS         Caroll RancherLindsay, Kaleel Schmieder A 04/04/2018 11:34 AM

## 2018-04-12 ENCOUNTER — Other Ambulatory Visit (HOSPITAL_COMMUNITY): Payer: Self-pay | Admitting: Psychiatry

## 2018-04-20 ENCOUNTER — Ambulatory Visit (INDEPENDENT_AMBULATORY_CARE_PROVIDER_SITE_OTHER): Payer: BLUE CROSS/BLUE SHIELD

## 2018-04-20 DIAGNOSIS — F319 Bipolar disorder, unspecified: Secondary | ICD-10-CM | POA: Diagnosis not present

## 2018-04-20 MED ORDER — ARIPIPRAZOLE ER 400 MG IM PRSY
400.0000 mg | PREFILLED_SYRINGE | INTRAMUSCULAR | Status: DC
  Administered 2018-04-20 – 2018-05-21 (×2): 400 mg via INTRAMUSCULAR

## 2018-04-20 NOTE — Progress Notes (Signed)
Patient presented with flat affect, level mood and denied any suicidal or homicidal ideations, no auditory or visual hallucinations and no other complaints.[facility]ation. Patient was [facility] - I spoke with Dr. [name] and patient would rather stay here in our practice, he did not [facility]. Patient wants to continue the Abilify Maintena as he thinks that it is helping. Patient's due Abilify Maintena 400 mg IM injection prepared as ordered and given to patient in hisleftdeltoid area. Patient tolerated due injection without complaint of pain or discomfort and agreed to return in 4 weeks for next due injection. Patient to call if any problems or change in symptoms prior to next due injection.

## 2018-04-24 ENCOUNTER — Encounter (HOSPITAL_COMMUNITY): Payer: Self-pay | Admitting: Psychiatry

## 2018-04-24 ENCOUNTER — Ambulatory Visit (INDEPENDENT_AMBULATORY_CARE_PROVIDER_SITE_OTHER): Payer: BLUE CROSS/BLUE SHIELD | Admitting: Psychiatry

## 2018-04-24 VITALS — BP 126/63 | HR 89 | Ht 67.0 in | Wt 280.0 lb

## 2018-04-24 DIAGNOSIS — F319 Bipolar disorder, unspecified: Secondary | ICD-10-CM | POA: Diagnosis not present

## 2018-04-24 DIAGNOSIS — F411 Generalized anxiety disorder: Secondary | ICD-10-CM | POA: Diagnosis not present

## 2018-04-24 DIAGNOSIS — F603 Borderline personality disorder: Secondary | ICD-10-CM

## 2018-04-24 MED ORDER — BUPROPION HCL ER (XL) 150 MG PO TB24: 150.0000 mg | ORAL_TABLET | Freq: Every day | ORAL | 0 refills | Status: DC

## 2018-04-24 MED ORDER — HYDROXYZINE HCL 25 MG PO TABS: 25.0000 mg | ORAL_TABLET | Freq: Three times a day (TID) | ORAL | 0 refills | Status: DC | PRN

## 2018-04-24 NOTE — Progress Notes (Signed)
BH MD/PA/NP OP Progress Note  04/24/2018 8:44 AM Andrew LuisRobert Noble  MRN:  [id number]  Chief Complaint: I am not taking Effexor.  Usually forgets to take medication.  HPI: Andrew MaduroRobert came for his appointment.  He was recently discharged from behavioral health center where he was admitted in December.  This was his fourth inpatient in year 2019.  Most of the time he was admitted due to noncompliance with medication.  He admitted on his last admission that he stopped taking all his oral medication and started to feel very depressed, having suicidal thoughts and paranoia.  He was also feeling very lonely isolated and thinking about the holidays.  In the hospital his Abilify was discontinued and continue his Effexor and hydroxyzine.  Today patient came for his appointment and admitted that he is not taking his Effexor however we have resumed his Abilify few days ago given the fact that he had a significant history of noncompliance with medication and Effexor does help him.  He also admitted that Abilify had helped him to control his anger and paranoia.  He is not sure why he is not taking Effexor as he claimed that sometimes he forgets.  He is using his CPAP machine which is helping his sleep as he is scared to stop CPAP machine because he was told he may die.  He endorsed feeling very tired especially during the day with lack of energy and motivation.  He stopped taking trazodone because he was sleeping already too much.  He denies any suicidal thoughts but continues to struggle with fatigue, depression, social isolation and hopelessness.  He denies any aggressive behavior.  He was seeing therapist Dr. Norwood LevoKerch at [facility] but he stopped going there.  He denies any self abusive behavior.  He works at [employer] and he likes his job.  He still have paranoia but denies any hallucination or any homicidal thoughts.  He was diagnosed with borderline personality disorder and we discussed that he should read about borderline  personality and we can referred him to DBT.  He is open to try a new antidepressant because he does not feel Effexor had helped him and he is also not taking the medication.  He takes hydroxyzine as needed when he is very anxious and nervous.  He denies drinking or using any illegal substances.    Visit Diagnosis:    ICD-10-CM   1. Generalized anxiety disorder F41.1 hydrOXYzine (ATARAX/VISTARIL) 25 MG tablet  2. Bipolar 1 disorder (HCC) F31.9 buPROPion (WELLBUTRIN XL) 150 MG 24 hr tablet  3. Borderline personality disorder (HCC) F60.3 buPROPion (WELLBUTRIN XL) 150 MG 24 hr tablet    Past Psychiatric History: Reviewed. Started seeing provider since age 23.  History of suicidal attempt in his teens.  History of numerous inpatient treatment.  At least four psychiatric inpatient in 2019.  Last in December 2019.  History of noncompliant with medication.  He tried Risperdal, Depakote, propanolol, Neurontin in the past.  He tried lithium that cause shakes, Prozac which was discontinued on his inpatient treatment.  Past Medical History:  Past Medical History:  Diagnosis Date  . Abnormal laboratory test 02/12/2014  . Anxiety   . Bipolar disorder (HCC)   . Depression   . Hx of substance abuse (HCC) 02/12/2014  . Psychosis [facility](HCC)     Past Surgical History:  Procedure Laterality Date  . TONSILLECTOMY AND ADENOIDECTOMY      Family Psychiatric History: Reviewed.  Family History:  Family History  Problem Relation Age of  Onset  . Depression Mother   . Anxiety disorder Mother   . Bipolar disorder Maternal Aunt     Social History:  Social History   Socioeconomic History  . Marital status: Single    Spouse name: Not on file  . Number of children: Not on file  . Years of education: Not on file  . Highest education level: Not on file  Occupational History  . Not on file  Social Needs  . Financial resource strain: Not on file  . Food insecurity:    Worry: Not on file    Inability: Not on  file  . Transportation needs:    Medical: Not on file    Non-medical: Not on file  Tobacco Use  . Smoking status: Former Smoker    Packs/day: 0.25    Years: 1.00    Pack years: 0.25    Types: Cigarettes    Last attempt to quit: 03/28/2018    Years since quitting: 0.0  . Smokeless tobacco: Former Neurosurgeon[employer]    Types: Chew    Quit date: 03/28/2018  Substance and Sexual Activity  . Alcohol use: Yes    Alcohol/week: 1.0 - 2.0 standard drinks    Types: 1 - 2 Cans of beer per week    Comment: rare  . Drug use: Yes    Types: Marijuana    Comment: no longer using  . Sexual activity: Never  Lifestyle  . Physical activity:    Days per week: Not on file    Minutes per session: Not on file  . Stress: Not on file  Relationships  . Social connections:    Talks on phone: Not on file    Gets together: Not on file    Attends religious service: Not on file    Active member of club or organization: Not on file    Attends meetings of clubs or organizations: Not on file    Relationship status: Not on file  Other Topics Concern  . Not on file  Social History Narrative  . Not on file    Allergies: No Known Allergies  Metabolic Disorder Labs: Recent Results (from the past 2160 hour(s))  Comprehensive metabolic panel     Status: Abnormal   Collection Time: 04/01/18  7:18 AM  Result Value Ref Range   Sodium 140 135 - 145 mmol/L   Potassium 4.0 3.5 - 5.1 mmol/L   Chloride 104 98 - 111 mmol/L   CO2 22 22 - 32 mmol/L   Glucose, Bld 178 (H) 70 - 99 mg/dL   BUN 15 6 - 20 mg/dL   Creatinine, Ser 1.611.14 0.61 - 1.24 mg/dL   Calcium 9.3 8.9 - 09.610.3 mg/dL   Total Protein 7.0 6.5 - 8.1 g/dL   Albumin 4.1 3.5 - 5.0 g/dL   AST 51 (H) 15 - 41 U/L   ALT 90 (H) 0 - 44 U/L   Alkaline Phosphatase 60 38 - 126 U/L   Total Bilirubin 0.8 0.3 - 1.2 mg/dL   GFR calc non Af Amer >60 >60 mL/min   GFR calc Af Amer >60 >60 mL/min   Anion gap 14 5 - 15    Comment: Performed at [facility], [address] [place], [place] [id number]  Hemoglobin A1c     Status: None   Collection Time: 04/01/18  7:18 AM  Result Value Ref Range   Hgb A1c MFr Bld 4.9 4.8 - 5.6 %    Comment: (NOTE)  Pre diabetes:          5.7%-6.4% Diabetes:              >6.4% Glycemic control for   <7.0% adults with diabetes    Mean Plasma Glucose 93.93 mg/dL    Comment: Performed at [facility], [address], [place], [place] [address]  Lipid panel     Status: Abnormal   Collection Time: 04/01/18  7:18 AM  Result Value Ref Range   Cholesterol 172 0 - 200 mg/dL   Triglycerides 177 <116 mg/dL   HDL 33 (L) >57 mg/dL   Total CHOL/HDL Ratio 5.2 RATIO   VLDL 27 0 - 40 mg/dL   LDL Cholesterol 903 (H) 0 - 99 mg/dL    Comment:        Total Cholesterol/HDL:CHD Risk Coronary Heart Disease Risk Table                     Men   Women  1/2 Average Risk   3.4   3.3  Average Risk       5.0   4.4  2 X Average Risk   9.6   7.1  3 X Average Risk  23.4   11.0        Use the calculated Patient Ratio above and the CHD Risk Table to determine the patient's CHD Risk.        ATP III CLASSIFICATION (LDL):  <100     mg/dL   Optimal  833-383  mg/dL   Near or Above                    Optimal  130-159  mg/dL   Borderline  291-916  mg/dL   High  >606     mg/dL   Very High Performed at [facility], [address], [place], [place] [address]   TSH     Status: None   Collection Time: 04/01/18  7:18 AM  Result Value Ref Range   TSH 2.113 0.350 - 4.500 uIU/mL    Comment: Performed by a 3rd Generation assay with a functional sensitivity of <=0.01 uIU/mL. Performed at [facility], [address], [place], [place] [address]   CBC     Status: None   Collection Time: 04/01/18  7:18 AM  Result Value Ref Range   WBC 8.4 4.0 - 10.5 K/uL   RBC 5.33 4.22 - 5.81 MIL/uL   Hemoglobin 16.5 13.0 - 17.0 g/dL   HCT 42.3 95.3 - 20.2 %   MCV 93.6 80.0 - 100.0 fL   MCH 31.0 26.0 - 34.0 pg    MCHC 33.1 30.0 - 36.0 g/dL   RDW 33.4 35.6 - 86.1 %   Platelets 259 150 - 400 K/uL   nRBC 0.0 0.0 - 0.2 %    Comment: Performed at [facility], [address], [place], [place] [address]   Lab Results  Component Value Date   HGBA1C 4.9 04/01/2018   MPG 93.93 04/01/2018   MPG 88.19 12/09/2017   Lab Results  Component Value Date   PROLACTIN 5.0 04/29/2014   PROLACTIN 46.1 (H) 12/17/2013   Lab Results  Component Value Date   CHOL 172 04/01/2018   TRIG 137 04/01/2018   HDL 33 (L) 04/01/2018   CHOLHDL 5.2 04/01/2018   VLDL 27 04/01/2018   LDLCALC 112 (H) 04/01/2018   LDLCALC 137 (H) 12/09/2017   Lab Results  Component Value Date   TSH 2.113 04/01/2018   TSH 2.304 10/06/2017    Therapeutic Level Labs: Lab Results  Component Value Date   LITHIUM 0.2 (L) 10/24/2017   LITHIUM 0.18 (L) 10/06/2017   Lab Results  Component Value Date   VALPROATE 84.5 10/31/2014   VALPROATE 83.4 04/29/2014   No components found for:  CBMZ  Current Medications: Current Outpatient Medications  Medication Sig Dispense Refill  . hydrOXYzine (ATARAX/VISTARIL) 25 MG tablet Take 1 tablet (25 mg total) by mouth 3 (three) times daily as needed for anxiety. 60 tablet 0  . nicotine (NICODERM CQ - DOSED IN MG/24 HOURS) 21 mg/24hr patch Place 1 patch (21 mg total) onto the skin daily. (May buy from over the shelter): For smoking cessation 28 patch 0  . traZODone (DESYREL) 150 MG tablet Take 1 tablet (150 mg total) by mouth at bedtime as needed for sleep. 30 tablet 0  . venlafaxine XR (EFFEXOR-XR) 75 MG 24 hr capsule Take 1 capsule (75 mg total) by mouth daily with breakfast. For depression 30 capsule 0   Current Facility-Administered Medications  Medication Dose Route Frequency Provider Last Rate Last Dose  . ARIPiprazole ER (ABILIFY MAINTENA) 400 MG prefilled syringe 400 mg  400 mg Intramuscular Q28 days Zaire Levesque, Phillips Grout, MD   400 mg at 04/20/18 1610     Musculoskeletal: Strength  & Muscle Tone: within normal limits Gait & Station: normal Patient leans: N/A  Psychiatric Specialty Exam: Review of Systems  Constitutional: Positive for malaise/fatigue. Negative for weight loss.  HENT: Negative.   Respiratory: Negative.   Skin: Negative.   Psychiatric/Behavioral: Positive for depression. The patient is nervous/anxious.     Blood pressure 126/63, pulse 89, height 5\' 7"  (1.702 m), weight 280 lb (127 kg).There is no height or weight on file to calculate BMI.  General Appearance: Casual and Overweight  Eye Contact:  Fair  Speech:  Clear and Coherent  Volume:  Normal  Mood:  Anxious and Dysphoric  Affect:  Congruent  Thought Process:  Goal Directed  Orientation:  Full (Time, Place, and Person)  Thought Content: Paranoid Ideation and Rumination   Suicidal Thoughts:  No  Homicidal Thoughts:  No  Memory:  Immediate;   Good Recent;   Good Remote;   Fair  Judgement:  Fair  Insight:  Fair  Psychomotor Activity:  Decreased  Concentration:  Concentration: Fair and Attention Span: Fair  Recall:  Good  Fund of Knowledge: Good  Language: Good  Akathisia:  No  Handed:  Right  AIMS (if indicated): not done  Assets:  Communication Skills Desire for Improvement Housing Talents/Skills  ADL's:  Intact  Cognition: WNL  Sleep:  Too much   Screenings: AIMS     Ad[facility] (Discharged) [facility] Admission (Discharged) from OP [facility] Admission (Discharged) [facility]  AIMS Total Score  0  0  0    AUDIT     Admission (Discharged) from [facility] Admission (Discharged) from OP Visit from [facility] Admission (Discharged) from [facility]   Alcohol Use Disorder Identification Test Final Score (AUDIT)  1  7  0    GAD-7     Office Visit from 09/22/2016 in [facility]  AT [place]  Total GAD-7 Score  21    PHQ2-9     Office Visit from 09/22/2016 in [facility] AT [place] Office Visit from 04/21/2016 in [facility] AT [place]  PHQ-2 Total Score  5  5  PHQ-9 Total Score  23  21       Assessment and Plan: Borderline personality disorder.  Bipolar disorder type I.  Generalized anxiety disorder.  Obstructive sleep apnea.  I review records from recent hospitalization and discharge summary.  It is unclear why Abilify injection was discontinued.  We had resumed the injection and he is getting Abilify for 1 mg intramuscular.  He had stopped taking his Effexor.  We talked about trying Wellbutrin which she had never recall taking it.  We discussed it may help his attention, focus and energy level.  He admitted sleeping too much but he like to continue CPAP machine which helps the quality of sleep.  I will discontinue trazodone and Effexor since he is not taking it.  I also discussed to read about borderline personality disorder.  If he agreed we will recommend him DBT.  We discussed safety concern in detail at any time in the future if he had any suicidal thoughts or homicidal thought then he should call us immediately.  I will see him again in 4 weeks.   Cleotis Nipper, MD 04/24/2018, 8:44 AM

## 2018-04-30 ENCOUNTER — Telehealth (HOSPITAL_COMMUNITY): Payer: Self-pay

## 2018-04-30 NOTE — Telephone Encounter (Signed)
Called and informed the patient about the message.

## 2018-04-30 NOTE — Telephone Encounter (Signed)
Discontinue Wellbutrin.  Resume venlafaxine 75 mg daily.

## 2018-04-30 NOTE — Telephone Encounter (Signed)
Patient said that the Wellbutrin XL 150 mg isn't working and it is making him more depressed. He said that he was taking Venlafaxine at one time and it helped more than Wellbutrin. Please advise

## 2018-05-21 ENCOUNTER — Ambulatory Visit (INDEPENDENT_AMBULATORY_CARE_PROVIDER_SITE_OTHER): Payer: BLUE CROSS/BLUE SHIELD

## 2018-05-21 DIAGNOSIS — F314 Bipolar disorder, current episode depressed, severe, without psychotic features: Secondary | ICD-10-CM | POA: Diagnosis not present

## 2018-05-21 NOTE — Progress Notes (Signed)
Patient presented with flat affect, level mood and denied any suicidal or homicidal ideations, no auditory or visual hallucinations and no other complaints. Patient wants to continue the Abilify Maintena as he thinks that it is helping. Patient's due Abilify Maintena 400 mg IM injection prepared as ordered and given to patient in hisrightdeltoid area. Patient tolerated due injection without complaint of pain or discomfort and agreed to return in 4 weeks for next due injection. Patient to call if any problems or change in symptoms prior to next due injection. 

## 2018-05-24 DIAGNOSIS — J020 Streptococcal pharyngitis: Secondary | ICD-10-CM | POA: Diagnosis not present

## 2018-05-25 ENCOUNTER — Ambulatory Visit (HOSPITAL_COMMUNITY): Payer: BLUE CROSS/BLUE SHIELD | Admitting: Psychiatry

## 2018-06-07 ENCOUNTER — Encounter (HOSPITAL_COMMUNITY): Payer: Self-pay | Admitting: Psychiatry

## 2018-06-07 ENCOUNTER — Ambulatory Visit (INDEPENDENT_AMBULATORY_CARE_PROVIDER_SITE_OTHER): Payer: BLUE CROSS/BLUE SHIELD | Admitting: Psychiatry

## 2018-06-07 VITALS — BP 122/81 | HR 98 | Ht 67.0 in | Wt 281.0 lb

## 2018-06-07 DIAGNOSIS — F603 Borderline personality disorder: Secondary | ICD-10-CM | POA: Diagnosis not present

## 2018-06-07 DIAGNOSIS — F411 Generalized anxiety disorder: Secondary | ICD-10-CM

## 2018-06-07 DIAGNOSIS — F314 Bipolar disorder, current episode depressed, severe, without psychotic features: Secondary | ICD-10-CM

## 2018-06-07 MED ORDER — ARIPIPRAZOLE ER 400 MG IM SRER: 400.0000 mg | INTRAMUSCULAR | 2 refills | Status: DC

## 2018-06-07 NOTE — Progress Notes (Signed)
BH MD/PA/NP OP Progress Note  06/07/2018 11:03 AM Andrew Noble  MRN:  [id number]  Chief Complaint: I am not taking Effexor.  I am sleeping better.  I got a new job and I am doing very well on my new job.  HPI: Andrew Noble came for his appointment.  He is not taking Effexor.  We have tried Wellbutrin but he did not like the Wellbutrin and like to go back on Effexor but now he is not taking Effexor.  He admitted forgot to take the medication but overall he feels Abilify injection helping his mood irritability, mood swing and mania.  He is happy that he got a new job.  He is sleeping better.  He is less anxious and he feels comfortable in his new job.  His only concern is paying the bills.  Since he switch his job he is now on his father's insurance but still have to pay a higher co-pay for doctor's visit and injections.  He is sleeping better since he is using CPAP.  He does not feel that he need to see a therapist.  He is not involved in any self abusive behavior.  Denies any suicidal thoughts or homicidal thought.  He has no tremors shakes or any EPS.  He is more active and he lost few pounds since last visit.  He is not drinking or using any illegal substances.  Visit Diagnosis:    ICD-10-CM   1. Bipolar 1 disorder, depressed, severe (HCC) F31.4 ARIPiprazole ER (ABILIFY MAINTENA) 400 MG SRER injection  2. GAD (generalized anxiety disorder) F41.1 ARIPiprazole ER (ABILIFY MAINTENA) 400 MG SRER injection  3. Borderline personality disorder (HCC) F60.3 ARIPiprazole ER (ABILIFY MAINTENA) 400 MG SRER injection    Past Psychiatric History: Reviewed. Seeing provider since age 54.  H/O suicidal attempt and multiple inpatient treatment.  At least four psychiatric inpatient in 2019.  Last in December 2019.  H/O noncompliant with medication.  He tried Risperdal, Depakote, propanolol, Wellbutrin, Effexor and Neurontin in the past.  Lithium cause shakes.  Most of the medication was discontinued due to  noncompliant.  Past Medical History:  Past Medical History:  Diagnosis Date  . Abnormal laboratory test 02/12/2014  . Anxiety   . Bipolar disorder (HCC)   . Depression   . Hx of substance abuse (HCC) 02/12/2014  . Psychosis [facility])     Past Surgical History:  Procedure Laterality Date  . TONSILLECTOMY AND ADENOIDECTOMY      Family Psychiatric History: Reviewed.  Family History:  Family History  Problem Relation Age of Onset  . Depression Mother   . Anxiety disorder Mother   . Bipolar disorder Maternal Aunt     Social History:  Social History   Socioeconomic History  . Marital status: Single    Spouse name: Not on file  . Number of children: Not on file  . Years of education: Not on file  . Highest education level: Not on file  Occupational History  . Not on file  Social Needs  . Financial resource strain: Not on file  . Food insecurity:    Worry: Not on file    Inability: Not on file  . Transportation needs:    Medical: Not on file    Non-medical: Not on file  Tobacco Use  . Smoking status: Former Smoker    Packs/day: 0.25    Years: 1.00    Pack years: 0.25    Types: Cigarettes    Last attempt to quit: 03/28/2018  Years since quitting: 0.1  . Smokeless tobacco: Former Neurosurgeon    Types: Chew    Quit date: 03/28/2018  Substance and Sexual Activity  . Alcohol use: Not Currently    Alcohol/week: 1.0 - 2.0 standard drinks    Types: 1 - 2 Cans of beer per week  . Drug use: Not Currently    Comment: no longer using  . Sexual activity: Never  Lifestyle  . Physical activity:    Days per week: Not on file    Minutes per session: Not on file  . Stress: Not on file  Relationships  . Social connections:    Talks on phone: Not on file    Gets together: Not on file    Attends religious service: Not on file    Active member of club or organization: Not on file    Attends meetings of clubs or organizations: Not on file    Relationship status: Not on file   Other Topics Concern  . Not on file  Social History Narrative  . Not on file    Allergies: No Known Allergies  Metabolic Disorder Labs: Lab Results  Component Value Date   HGBA1C 4.9 04/01/2018   MPG 93.93 04/01/2018   MPG 88.19 12/09/2017   Lab Results  Component Value Date   PROLACTIN 5.0 04/29/2014   PROLACTIN 46.1 (H) 12/17/2013   Lab Results  Component Value Date   CHOL 172 04/01/2018   TRIG 137 04/01/2018   HDL 33 (L) 04/01/2018   CHOLHDL 5.2 04/01/2018   VLDL 27 04/01/2018   LDLCALC 112 (H) 04/01/2018   LDLCALC 137 (H) 12/09/2017   Lab Results  Component Value Date   TSH 2.113 04/01/2018   TSH 2.304 10/06/2017    Therapeutic Level Labs: Lab Results  Component Value Date   LITHIUM 0.2 (L) 10/24/2017   LITHIUM 0.18 (L) 10/06/2017   Lab Results  Component Value Date   VALPROATE 84.5 10/31/2014   VALPROATE 83.4 04/29/2014   No components found for:  CBMZ  Current Medications: Current Outpatient Medications  Medication Sig Dispense Refill  . buPROPion (WELLBUTRIN XL) 150 MG 24 hr tablet Take 1 tablet (150 mg total) by mouth daily. (Patient not taking: Reported on 06/07/2018) 30 tablet 0   Current Facility-Administered Medications  Medication Dose Route Frequency Provider Last Rate Last Dose  . ARIPiprazole ER (ABILIFY MAINTENA) 400 MG prefilled syringe 400 mg  400 mg Intramuscular Q28 days Jaquari Reckner, Phillips Grout, MD   400 mg at 05/21/18 6270     Musculoskeletal: Strength & Muscle Tone: within normal limits Gait & Station: normal Patient leans: N/A  Psychiatric Specialty Exam: ROS  Blood pressure 122/81, pulse 98, height 5\' 7"  (1.702 m), weight 281 lb (127.5 kg).Body mass index is 44.01 kg/m.  General Appearance: Casual and Overweight  Eye Contact:  Fair  Speech:  Clear and Coherent  Volume:  Normal  Mood:  Euthymic  Affect:  Congruent  Thought Process:  Goal Directed  Orientation:  Full (Time, Place, and Person)  Thought Content: Logical    Suicidal Thoughts:  No  Homicidal Thoughts:  No  Memory:  Immediate;   Good Recent;   Good Remote;   Good  Judgement:  Good  Insight:  Fair  Psychomotor Activity:  Normal  Concentration:  Concentration: Fair and Attention Span: Fair  Recall:  [employer] of Knowledge: Fair  Language: Good  Akathisia:  No  Handed:  Right  AIMS (if indicated): not done  Assets:  Communication Skills Desire for Improvement Housing Resilience Social Support Talents/Skills  ADL's:  Intact  Cognition: WNL  Sleep:  Good   Screenings: AIMS     Admission (Discharged) from [facility] Admission (Discharged) from OP Visit [facility] Admission (Discharged) from [facility]  AIMS Total Score  0  0  0    AUDIT     Admission (Discharged) from [facility] Admission (Discharged) from OP Visit from [facility] Admission (Discharged) from [facility]  Alcohol Use Disorder Identification Test Final Score (AUDIT)  1  7  0    GAD-7     Office Visit from 09/22/2016 in [facility] AT [place]  Total GAD-7 Score  21    PHQ2-9     Office Visit from 09/22/2016 in [facility] AT [place] Office Visit from 04/21/2016 in [facility] AT [place]  PHQ-2 Total Score  5  5  PHQ-9 Total Score  23  21       Assessment and Plan: Bipolar disorder type I.  Borderline personality disorder.  Generalized anxiety disorder.  Patient doing better since getting Abilify injection every 4 weeks.  He had a history of noncompliance with medication.  He stopped taking Wellbutrin and then Lexapro which was given recently.  He preferred to continue  Abilify injection which he remembers and since then he has been doing much better.  He is able to get a better job at [employer].  He is concerned about getting bills and higher co-pays.  I recommended he should contact our billing department to resolve the issue.  Discussed medication side effects and benefits.  Encourage healthy lifestyle and watch his calorie intake.  He is not interested in therapy at this time.  Recommended to call us back if he has any question or any concern.  I will see him again in 3 months.   Cleotis NipperSyed T Kelby Lotspeich, MD 06/07/2018, 11:03 AM

## 2018-06-18 ENCOUNTER — Ambulatory Visit (HOSPITAL_COMMUNITY): Payer: BLUE CROSS/BLUE SHIELD

## 2018-06-19 ENCOUNTER — Encounter (HOSPITAL_COMMUNITY): Payer: Self-pay

## 2018-06-19 ENCOUNTER — Ambulatory Visit (INDEPENDENT_AMBULATORY_CARE_PROVIDER_SITE_OTHER): Payer: BLUE CROSS/BLUE SHIELD

## 2018-06-19 DIAGNOSIS — F314 Bipolar disorder, current episode depressed, severe, without psychotic features: Secondary | ICD-10-CM | POA: Diagnosis not present

## 2018-06-19 MED ORDER — ARIPIPRAZOLE ER 400 MG IM PRSY
400.0000 mg | PREFILLED_SYRINGE | Freq: Once | INTRAMUSCULAR | Status: AC
  Administered 2018-06-19: 400 mg via INTRAMUSCULAR

## 2018-06-19 NOTE — Progress Notes (Signed)
Patient presented with flat affect, level mood and denied any suicidal or homicidal ideations, no auditory or visual hallucinations and no other complaints. Patient wants to continue the Abilify Maintena as he thinks that it is helping.Patient's due Abilify Maintena 400 mg IM injection prepared as ordered and given to patient in hisleft deltoid area. Patient tolerated due injection without complaint of pain or discomfort and agreed to return in 4 weeks for next due injection. Patient to call if any problems or change in symptoms prior to next due injection.

## 2018-07-03 ENCOUNTER — Other Ambulatory Visit (HOSPITAL_COMMUNITY): Payer: Self-pay

## 2018-07-03 ENCOUNTER — Telehealth (HOSPITAL_COMMUNITY): Payer: Self-pay

## 2018-07-03 MED ORDER — HYDROXYZINE PAMOATE 25 MG PO CAPS: 25.0000 mg | ORAL_CAPSULE | Freq: Two times a day (BID) | ORAL | 0 refills | Status: DC | PRN

## 2018-07-03 NOTE — Telephone Encounter (Signed)
He can take hydroxyzine 25 mg twice a day as needed.  Please call 30-day supply to his pharmacy.

## 2018-07-03 NOTE — Telephone Encounter (Signed)
Prescription sent to the pharmacy and I called patient to let him know

## 2018-07-03 NOTE — Telephone Encounter (Signed)
Patient calling to report increased anxiety, he would like to know if you can send something in, patient states Vistaril has worked in the past. Please review and advise, thank you

## 2018-07-19 ENCOUNTER — Other Ambulatory Visit: Payer: Self-pay

## 2018-07-19 ENCOUNTER — Ambulatory Visit (INDEPENDENT_AMBULATORY_CARE_PROVIDER_SITE_OTHER): Payer: BLUE CROSS/BLUE SHIELD

## 2018-07-19 DIAGNOSIS — F319 Bipolar disorder, unspecified: Secondary | ICD-10-CM | POA: Diagnosis not present

## 2018-07-19 DIAGNOSIS — F314 Bipolar disorder, current episode depressed, severe, without psychotic features: Secondary | ICD-10-CM | POA: Diagnosis not present

## 2018-07-19 MED ORDER — ARIPIPRAZOLE ER 400 MG IM PRSY
400.0000 mg | PREFILLED_SYRINGE | INTRAMUSCULAR | Status: AC
  Administered 2018-07-19 – 2018-09-24 (×3): 400 mg via INTRAMUSCULAR

## 2018-07-19 NOTE — Progress Notes (Signed)
Patient presented with flat affect, level mood and denied any suicidal or homicidal ideations, no auditory or visual hallucinations and no other complaints. Patient wants to continue the Abilify Maintena as he thinks that it is helping. Patient's due Abilify Maintena 400 mg IM injection prepared as ordered and given to patient in hisrightdeltoid area. Patient tolerated due injection without complaint of pain or discomfort and agreed to return in 4 weeks for next due injection. Patient to call if any problems or change in symptoms prior to next due injection. 

## 2018-07-20 ENCOUNTER — Ambulatory Visit (HOSPITAL_COMMUNITY): Admission: RE | Admit: 2018-07-20 | Payer: BLUE CROSS/BLUE SHIELD | Source: Home / Self Care | Admitting: Psychiatry

## 2018-07-21 ENCOUNTER — Ambulatory Visit (HOSPITAL_COMMUNITY)
Admission: RE | Admit: 2018-07-21 | Discharge: 2018-07-21 | Disposition: A | Discharge status: Home / Self Care | Payer: BLUE CROSS/BLUE SHIELD | Attending: Psychiatry | Admitting: Psychiatry

## 2018-07-21 DIAGNOSIS — F429 Obsessive-compulsive disorder, unspecified: Secondary | ICD-10-CM | POA: Diagnosis not present

## 2018-07-21 DIAGNOSIS — R45851 Suicidal ideations: Secondary | ICD-10-CM | POA: Insufficient documentation

## 2018-07-21 DIAGNOSIS — Z915 Personal history of self-harm: Secondary | ICD-10-CM | POA: Diagnosis not present

## 2018-07-21 DIAGNOSIS — F17210 Nicotine dependence, cigarettes, uncomplicated: Secondary | ICD-10-CM | POA: Diagnosis not present

## 2018-07-21 DIAGNOSIS — F332 Major depressive disorder, recurrent severe without psychotic features: Secondary | ICD-10-CM | POA: Diagnosis not present

## 2018-07-21 DIAGNOSIS — F502 Bulimia nervosa: Secondary | ICD-10-CM | POA: Diagnosis not present

## 2018-07-21 DIAGNOSIS — Z599 Problem related to housing and economic circumstances, unspecified: Secondary | ICD-10-CM | POA: Diagnosis not present

## 2018-07-21 DIAGNOSIS — G4700 Insomnia, unspecified: Secondary | ICD-10-CM | POA: Insufficient documentation

## 2018-07-21 DIAGNOSIS — Z818 Family history of other mental and behavioral disorders: Secondary | ICD-10-CM | POA: Diagnosis not present

## 2018-07-21 DIAGNOSIS — F603 Borderline personality disorder: Secondary | ICD-10-CM | POA: Diagnosis not present

## 2018-07-21 DIAGNOSIS — F419 Anxiety disorder, unspecified: Secondary | ICD-10-CM | POA: Diagnosis not present

## 2018-07-21 DIAGNOSIS — F411 Generalized anxiety disorder: Secondary | ICD-10-CM | POA: Diagnosis not present

## 2018-07-21 DIAGNOSIS — Z79899 Other long term (current) drug therapy: Secondary | ICD-10-CM | POA: Diagnosis not present

## 2018-07-21 DIAGNOSIS — F3132 Bipolar disorder, current episode depressed, moderate: Secondary | ICD-10-CM | POA: Insufficient documentation

## 2018-07-21 DIAGNOSIS — G4730 Sleep apnea, unspecified: Secondary | ICD-10-CM | POA: Diagnosis not present

## 2018-07-21 DIAGNOSIS — F319 Bipolar disorder, unspecified: Secondary | ICD-10-CM | POA: Diagnosis not present

## 2018-07-21 DIAGNOSIS — R0683 Snoring: Secondary | ICD-10-CM | POA: Diagnosis not present

## 2018-07-21 DIAGNOSIS — F3181 Bipolar II disorder: Secondary | ICD-10-CM | POA: Diagnosis not present

## 2018-07-21 DIAGNOSIS — R450 Nervousness: Secondary | ICD-10-CM | POA: Insufficient documentation

## 2018-07-21 DIAGNOSIS — R001 Bradycardia, unspecified: Secondary | ICD-10-CM | POA: Diagnosis not present

## 2018-07-21 DIAGNOSIS — F410 Panic disorder [episodic paroxysmal anxiety] without agoraphobia: Secondary | ICD-10-CM | POA: Diagnosis not present

## 2018-07-21 DIAGNOSIS — Z6841 Body Mass Index (BMI) 40.0 and over, adult: Secondary | ICD-10-CM | POA: Diagnosis not present

## 2018-07-21 NOTE — H&P (Signed)
Behavioral Health Medical Screening Exam  Andrew Noble is an 23 y.o. male who presented to [facility] as a walk-in. Patient states he became overwhelmed at work today and they encouraged him to come in for an evaluation. Patient has multiple admissions to [facility] over the past year for similar presentations due to work related anxiety. Discussed the benefits of outpatient therapy versus inpatient admissions. Patient states that he feels safe returning home.   Total Time spent with patient: 30 minutes  Psychiatric Specialty Exam: Physical Exam  Constitutional: He is oriented to person, place, and time. He appears well-developed and well-nourished. No distress.  HENT:  Head: Normocephalic and atraumatic.  Right Ear: External ear normal.  Left Ear: External ear normal.  Eyes: Pupils are equal, round, and reactive to light.  Respiratory: Effort normal. No respiratory distress.  Musculoskeletal: Normal range of motion.  Neurological: He is alert and oriented to person, place, and time.  Skin: Skin is warm and dry. He is not diaphoretic.  Psychiatric: His mood appears anxious. Thought content is not paranoid and not delusional. He exhibits a depressed mood. He expresses no homicidal and no suicidal ideation. He expresses no suicidal plans.    Review of Systems  Constitutional: Negative for chills, diaphoresis, fever, malaise/fatigue and weight loss.  Respiratory: Negative for cough and shortness of breath.   Gastrointestinal: Negative for diarrhea, nausea and vomiting.  Psychiatric/Behavioral: Positive for depression and suicidal ideas. Negative for hallucinations, memory loss and substance abuse. The patient is nervous/anxious and has insomnia.     There were no vitals taken for this visit.There is no height or weight on file to calculate BMI.  General Appearance: Casual and Well Groomed  Eye Contact:  Good  Speech:  Clear and Coherent and Normal Rate  Volume:  Normal  Mood:  Anxious and Depressed   Affect:  Congruent and Depressed  Thought Process:  Coherent and Descriptions of Associations: Intact  Orientation:  Full (Time, Place, and Person)  Thought Content:  Logical and Hallucinations: None  Suicidal Thoughts:  Denies current SI. States "I could never do it, its just not me."  Homicidal Thoughts:  No  Memory:  Immediate;   Fair Recent;   Fair  Judgement:  Fair  Insight:  Fair  Psychomotor Activity:  Normal  Concentration: Concentration: Fair and Attention Span: Fair  Recall:  Good  Fund of Knowledge:Good  Language: Good  Akathisia:  Negative  Handed:  Right  AIMS (if indicated):     Assets:  Communication Skills Desire for Improvement Financial Resources/Insurance Housing Leisure Time Physical Health  Sleep:       Musculoskeletal: Strength & Muscle Tone: within normal limits Gait & Station: normal    Recommendations:  Based on my evaluation the patient does not appear to have an emergency medical condition.   No evidence of imminent risk to self or others at present.   Patient does not meet criteria for psychiatric inpatient admission. Supportive therapy provided about ongoing stressors. Discussed crisis plan, support from social network, calling 911, coming to the Emergency Department, and calling Suicide Hotline.   Jackelyn Poling, NP 07/21/2018, 3:11 AM
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
## 2018-07-22 DIAGNOSIS — F419 Anxiety disorder, unspecified: Secondary | ICD-10-CM | POA: Diagnosis not present

## 2018-07-22 DIAGNOSIS — R45851 Suicidal ideations: Secondary | ICD-10-CM | POA: Diagnosis not present

## 2018-07-22 DIAGNOSIS — R001 Bradycardia, unspecified: Secondary | ICD-10-CM | POA: Diagnosis not present

## 2018-07-22 DIAGNOSIS — F332 Major depressive disorder, recurrent severe without psychotic features: Secondary | ICD-10-CM | POA: Diagnosis not present

## 2018-07-22 DIAGNOSIS — F319 Bipolar disorder, unspecified: Secondary | ICD-10-CM | POA: Diagnosis not present

## 2018-07-23 ENCOUNTER — Other Ambulatory Visit (HOSPITAL_COMMUNITY): Payer: Self-pay | Admitting: Psychiatry

## 2018-07-23 DIAGNOSIS — F419 Anxiety disorder, unspecified: Secondary | ICD-10-CM | POA: Diagnosis not present

## 2018-07-23 DIAGNOSIS — F319 Bipolar disorder, unspecified: Secondary | ICD-10-CM | POA: Diagnosis not present

## 2018-07-24 DIAGNOSIS — F319 Bipolar disorder, unspecified: Secondary | ICD-10-CM | POA: Diagnosis not present

## 2018-07-24 DIAGNOSIS — F419 Anxiety disorder, unspecified: Secondary | ICD-10-CM | POA: Diagnosis not present

## 2018-07-25 DIAGNOSIS — F319 Bipolar disorder, unspecified: Secondary | ICD-10-CM | POA: Diagnosis not present

## 2018-07-25 DIAGNOSIS — F419 Anxiety disorder, unspecified: Secondary | ICD-10-CM | POA: Diagnosis not present

## 2018-07-25 DIAGNOSIS — F603 Borderline personality disorder: Secondary | ICD-10-CM | POA: Diagnosis not present

## 2018-07-26 DIAGNOSIS — F319 Bipolar disorder, unspecified: Secondary | ICD-10-CM | POA: Diagnosis not present

## 2018-07-26 DIAGNOSIS — R45851 Suicidal ideations: Secondary | ICD-10-CM | POA: Diagnosis not present

## 2018-07-26 DIAGNOSIS — F419 Anxiety disorder, unspecified: Secondary | ICD-10-CM | POA: Diagnosis not present

## 2018-08-01 ENCOUNTER — Other Ambulatory Visit: Payer: Self-pay

## 2018-08-01 ENCOUNTER — Ambulatory Visit (INDEPENDENT_AMBULATORY_CARE_PROVIDER_SITE_OTHER): Payer: BC Managed Care – PPO | Admitting: Psychiatry

## 2018-08-01 DIAGNOSIS — F314 Bipolar disorder, current episode depressed, severe, without psychotic features: Secondary | ICD-10-CM

## 2018-08-01 DIAGNOSIS — Z8659 Personal history of other mental and behavioral disorders: Secondary | ICD-10-CM | POA: Diagnosis not present

## 2018-08-01 DIAGNOSIS — F411 Generalized anxiety disorder: Secondary | ICD-10-CM | POA: Diagnosis not present

## 2018-08-01 DIAGNOSIS — F603 Borderline personality disorder: Secondary | ICD-10-CM | POA: Diagnosis not present

## 2018-08-01 MED ORDER — VENLAFAXINE HCL ER 37.5 MG PO CP24: ORAL_CAPSULE | ORAL | 1 refills | Status: DC

## 2018-08-01 MED ORDER — HYDROXYZINE HCL 25 MG PO TABS: 25.0000 mg | ORAL_TABLET | Freq: Three times a day (TID) | ORAL | 0 refills | Status: DC | PRN

## 2018-08-01 MED ORDER — ARIPIPRAZOLE ER 400 MG IM SRER: 400.0000 mg | INTRAMUSCULAR | 2 refills | Status: DC

## 2018-08-01 NOTE — Progress Notes (Signed)
Virtual Visit via Telephone Note  I connected with Andrew Noble on 08/01/18 at  2:40 PM EDT by telephone and verified that I am speaking with the correct person using two identifiers.   I discussed the limitations, risks, security and privacy concerns of performing an evaluation and management service by telephone and the availability of in person appointments. I also discussed with the patient that there may be a patient responsible charge related to this service. The patient expressed understanding and agreed to proceed.   History of Present Illness: Patient was evaluated through phone session.  [facility] due to suicidal thoughts.  Initially he came to our emergency room department back did not meet criteria and [facility]  Patient told that he was experiencing a lot of anxiety and depression and having suicidal thoughts.  He had a bad day at work.  As per notes patient has superficially cut his left arm but did not require any stitches.  In the hospital he was started zonisamide, hydroxyzine and we started Lexapro which he has taken in the past but stopped due to tremors.  His employer put him out of work after found to be tearful, crying and he was unable to focus at work.  Patient is not taking zonisamide and he is complaining of tremors which most likely due to Lexapro.  He recall Effexor had helped him in the past but he stopped taking after feeling better.  He is getting Abilify injection and the last injection was given April 2.  He is feeling better and he denies any suicidal thoughts but admitted still anxious and nervous.  He lives by himself.  He is not using drugs or alcohol.  He like to go back to work.  He also like to go back to Effexor but patient has a history of noncompliance with medication.  He denies any mania, psychosis, hallucination.  He reported no tremors or shakes.  He admitted not using CPAP all the time but  promised that he will use on a regular basis.  Patient has eating disorder but he is not binging but there are times when he skipped eating.  He reported his appetite and weight is stable.   Past Psychiatric History: Reviewed. Seeing provider since age 89. H/O suicidal attempt and multiple inpatient treatment. At least fourpsychiatric inpatient in 2019. [facility]. H/O noncompliant with medication. He tried Risperdal, Depakote, propanolol, Wellbutrin, Effexor and Neurontin in the past. Lithium and Lexapro (shakes). Most of the medication was discontinued due to noncompliant.   Observations/Objective: Limited mental status admission done on the phone.  Patient describes his mood anxious.  His speech is slow but clear and coherent.  His thought process circumstantial.  His attention and concentration is fair.  He denies any auditory or visual hallucination.  He denies any active or passive suicidal thoughts or homicidal thought.  There were no delusions or paranoia.  He is alert and oriented x3.  He has some difficulty remembering things.  His cognition is below average.  His insight judgment fair.  Assessment and Plan: Bipolar disorder type I.  Generalized anxiety disorder.  Borderline personality disorder.  History of eating disorder.  I reviewed records, blood work results, medication from last discharge summary.  His triglycerides 233, ALT 55 and LDL 147.  His glucose was 110.  All other labs are normal.  His UDS was also negative.  Patient was  discharged on zonisamide, Lexapro and hydroxyzine.  He did not receive Abilify injection while he was in the hospital.  I had a long discussion about compliance issue.  We also talked about DBT but patient needs some time until pandemic coronavirus get under control.  He like to go back to work.  He is not taking zonisamide but taking Lexapro and he noticed tremors and shakes.  He recall Lexapro has because tremors in the past and  we have switched to Effexor which worked well for him but then he stopped taking it after feeling better.  Patient agreed to go back on Effexor.  I will start Effexor XR 37.5 mg for 1 week and then 75 mg daily.  Recommend to continue Atarax 25 mg up to 3 times a day for anxiety.  Patient is not involved in binge eating but admitted there are days when he restrict his diet.  He reported his appetite and weight is unchanged from the past.  He will get his next Abilify injection on May 4.  We will complete paperwork so he can go back to work as of today.  I discussed in length safety concern that anytime having active suicidal thoughts or homicidal thoughts and he need to call 911 of the local emergency room.  Recommend if he has any side effects from the medication then he should call us immediately.  Patient has a upcoming appointment with me in 4 to 5 weeks and I recommend to keep that appointment.  Follow Up Instructions:    I discussed the assessment and treatment plan with the patient. The patient was provided an opportunity to ask questions and all were answered. The patient agreed with the plan and demonstrated an understanding of the instructions.   The patient was advised to call back or seek an in-person evaluation if the symptoms worsen or if the condition fails to improve as anticipated.  I provided 30 minutes of non-face-to-face time during this encounter.   Cleotis Nipper, MD

## 2018-08-02 ENCOUNTER — Encounter (HOSPITAL_COMMUNITY): Payer: Self-pay

## 2018-08-20 ENCOUNTER — Ambulatory Visit (HOSPITAL_COMMUNITY): Payer: BLUE CROSS/BLUE SHIELD

## 2018-08-21 ENCOUNTER — Ambulatory Visit (INDEPENDENT_AMBULATORY_CARE_PROVIDER_SITE_OTHER): Payer: BC Managed Care – PPO

## 2018-08-21 ENCOUNTER — Other Ambulatory Visit: Payer: Self-pay

## 2018-08-21 DIAGNOSIS — F319 Bipolar disorder, unspecified: Secondary | ICD-10-CM | POA: Diagnosis not present

## 2018-08-21 NOTE — Progress Notes (Signed)
  Patient presented with flat affect, level mood and denied any suicidal or homicidal ideations, no auditory or visual hallucinations and no other complaints. Patient wants to continue the Abilify Maintena as he thinks that it is helping.Patient brought in breakfast for the staff from [employer]. Patient's due Abilify Maintena 400 mg IM injection prepared as ordered and given to patient in hisleft deltoid area. Patient tolerated due injection without complaint of pain or discomfort and agreed to return in 4 weeks for next due injection. Patient to call if any problems or change in symptoms prior to next due injection.

## 2018-08-23 ENCOUNTER — Other Ambulatory Visit (HOSPITAL_COMMUNITY): Payer: Self-pay | Admitting: Psychiatry

## 2018-08-23 DIAGNOSIS — F411 Generalized anxiety disorder: Secondary | ICD-10-CM

## 2018-09-05 ENCOUNTER — Ambulatory Visit (HOSPITAL_COMMUNITY): Payer: BLUE CROSS/BLUE SHIELD | Admitting: Psychiatry

## 2018-09-05 ENCOUNTER — Other Ambulatory Visit: Payer: Self-pay

## 2018-09-05 ENCOUNTER — Ambulatory Visit (HOSPITAL_COMMUNITY): Payer: Self-pay | Admitting: Psychiatry

## 2018-09-11 IMAGING — DX DG ABDOMEN 2V
4 series · 4 of 4 positions shown · non-contrast
Comparison: None

CLINICAL DATA: RIGHT upper quadrant and RIGHT lower chest pain
nausea and vomiting, constipation

EXAM:
ABDOMEN - 2 VIEW

[abdomen erect (1 of 2)]
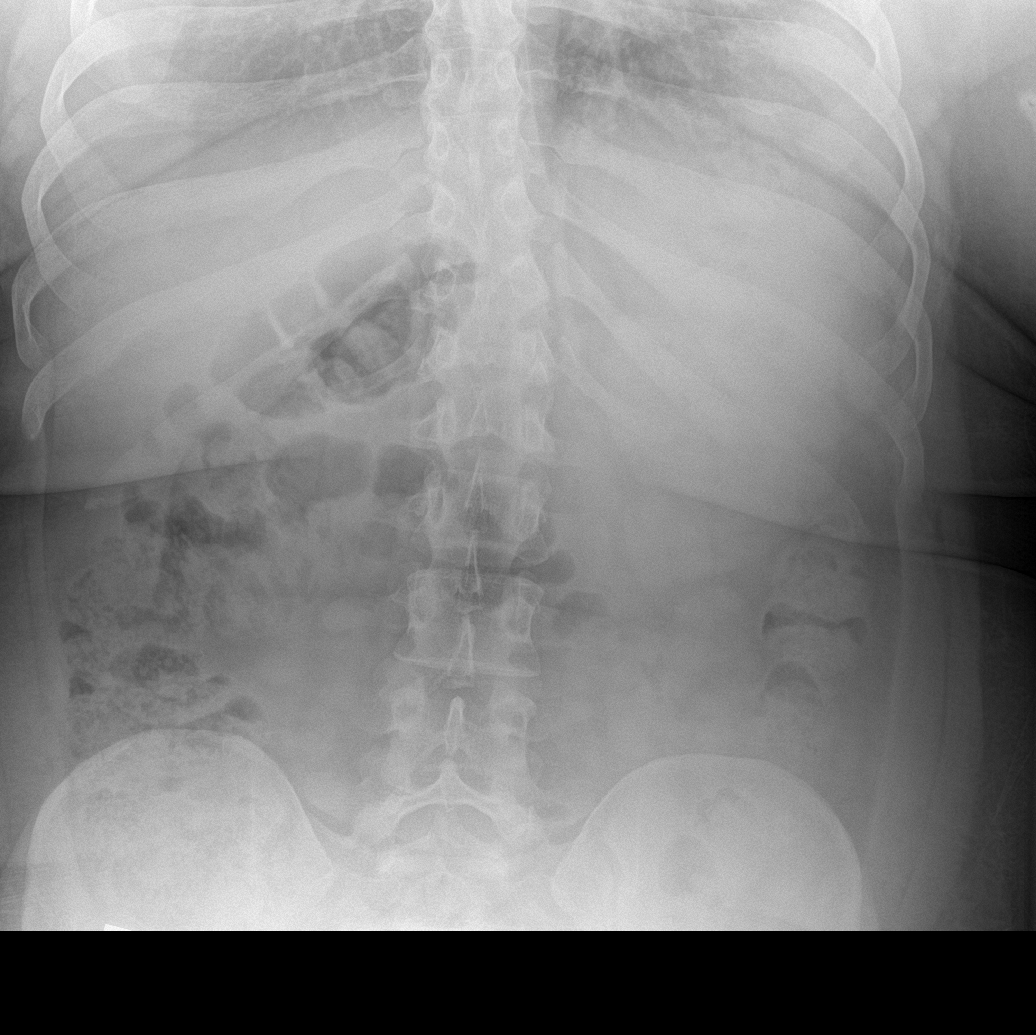

[abdomen supine (1 of 2)]
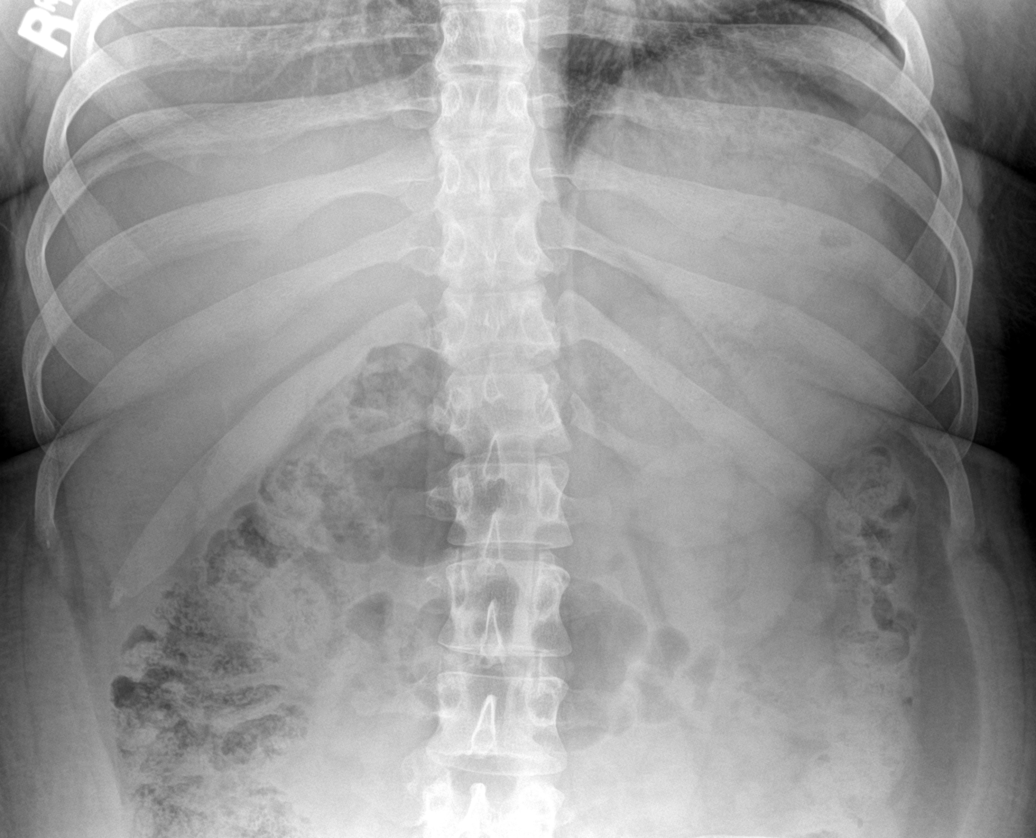

[abdomen supine (2 of 2)]
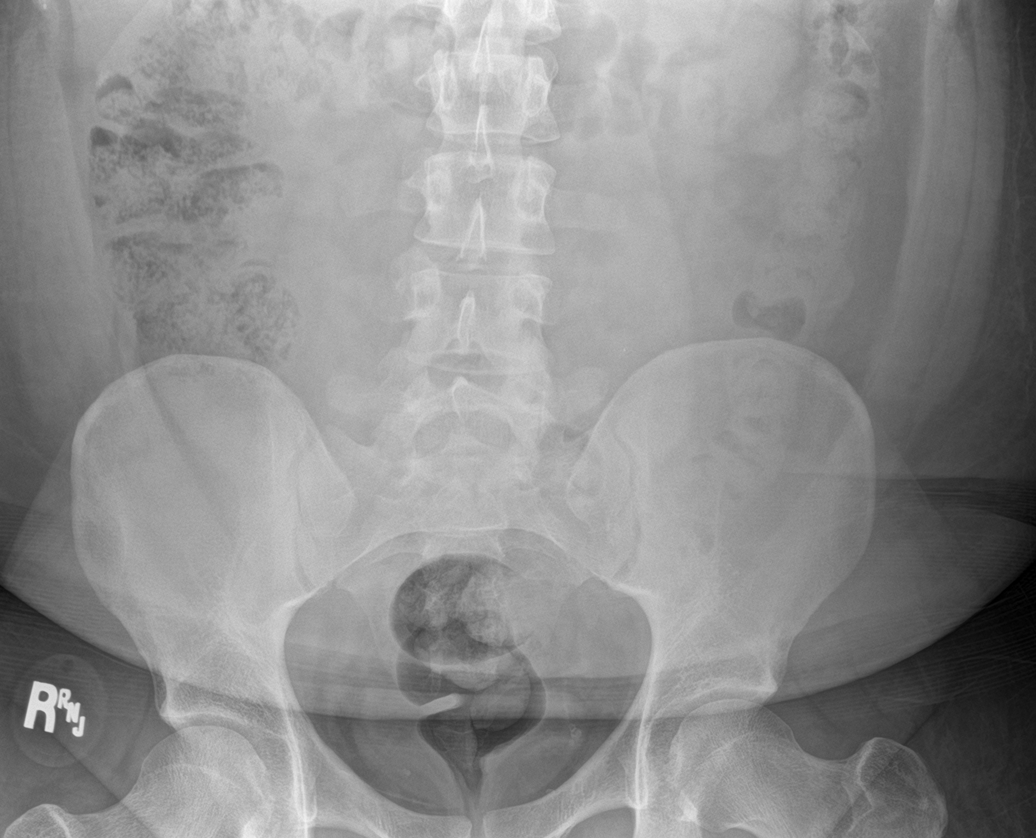

[abdomen erect (2 of 2)]
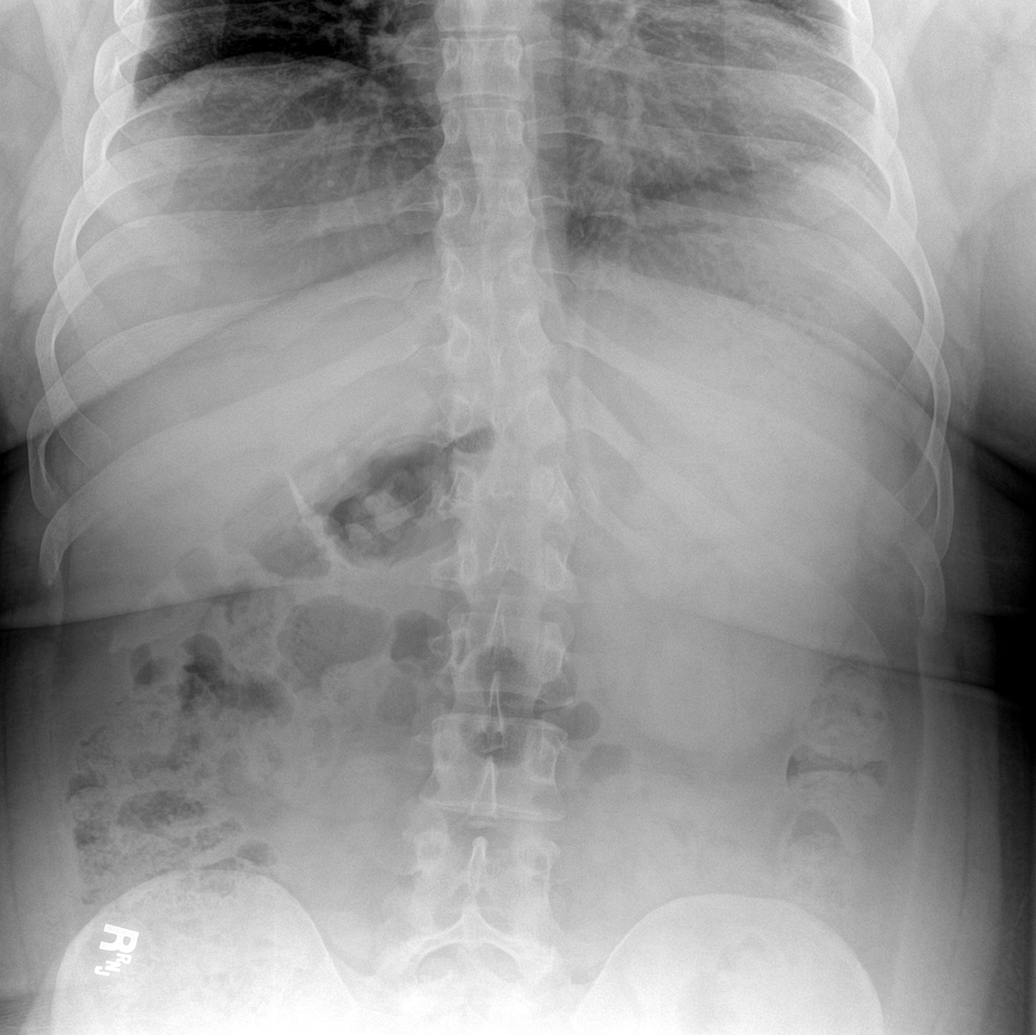

[4 of 4 positions shown; findings below may reference images not displayed]

FINDINGS: Mildly prominent stool in colon.

Nonobstructive bowel gas pattern.

No bowel dilatation or bowel wall thickening.

Osseous structures unremarkable.

No urinary tract calcification.

Lung bases clear.
IMPRESSION: Nonobstructive bowel gas pattern with mildly prominent stool in
colon.

## 2018-09-11 IMAGING — DX DG CHEST 2V
2 series · 2 of 2 positions shown · non-contrast
Comparison: None

CLINICAL DATA: RIGHT upper quadrant RIGHT lower chest pain, nausea
and vomiting, constipation

EXAM:
CHEST - 2 VIEW

[chest pa]
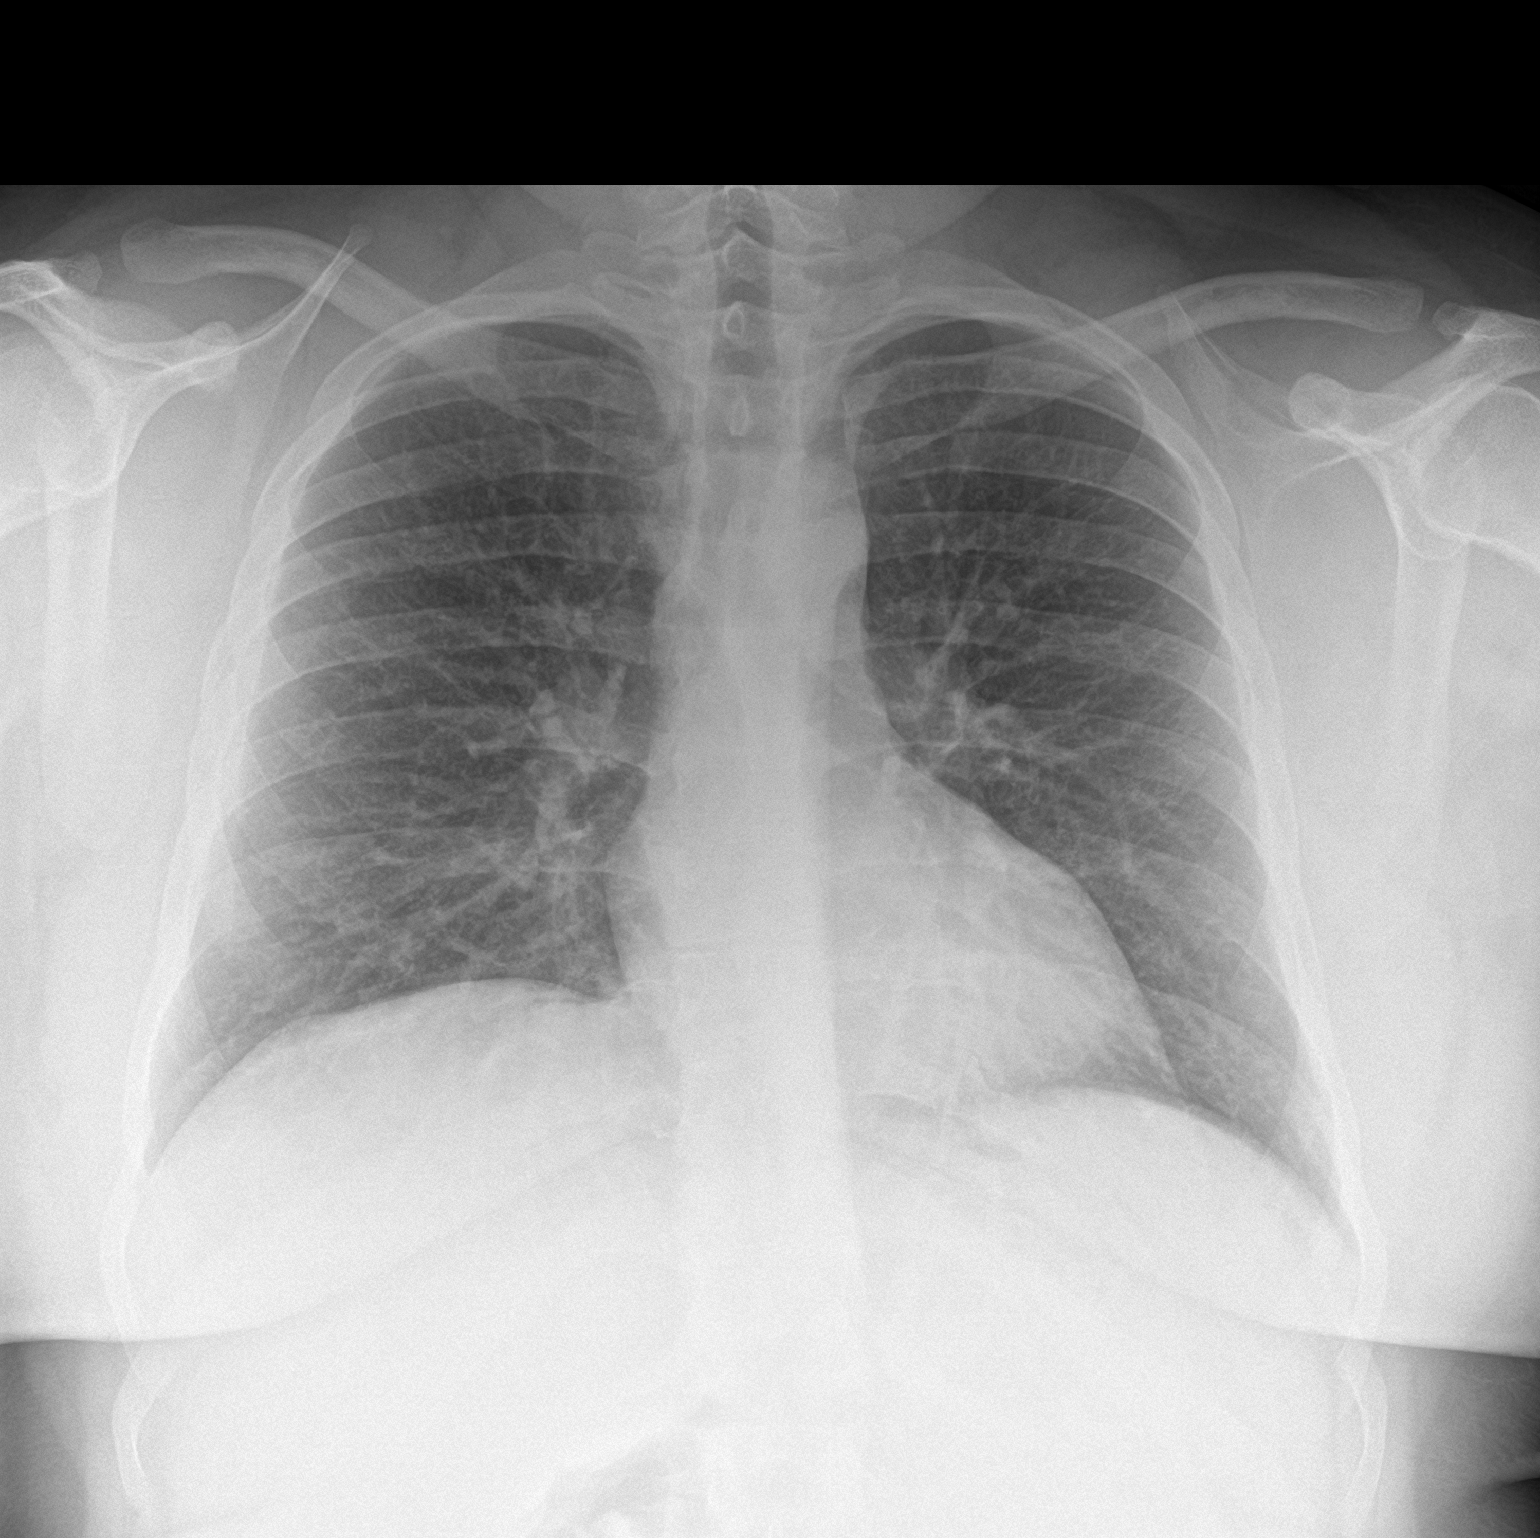

[chest lat]
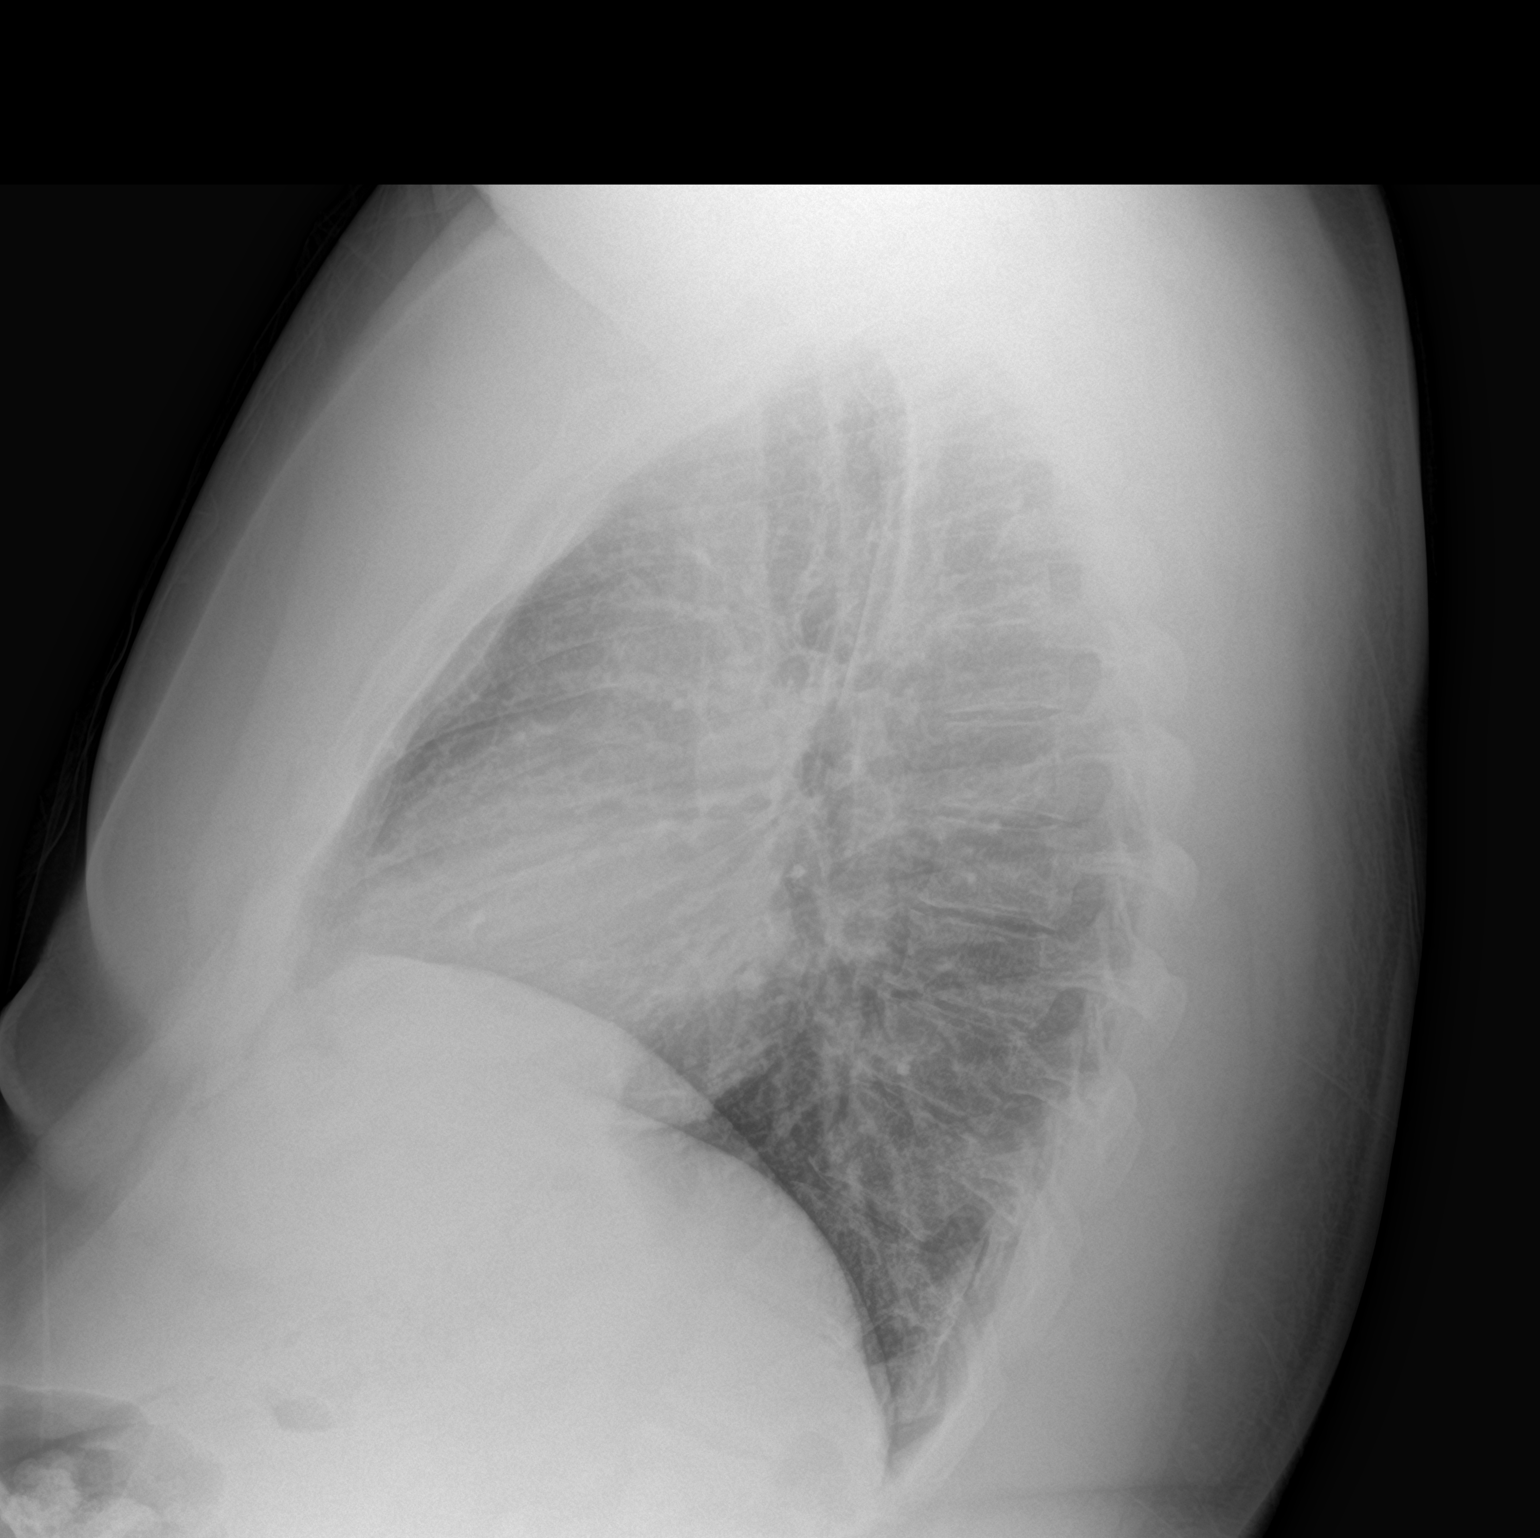

[2 of 2 positions shown; findings below may reference images not displayed]

FINDINGS: Normal heart size, mediastinal contours, and pulmonary vascularity.

Lungs clear.

No pleural effusion or pneumothorax.

Bones unremarkable.
IMPRESSION: Normal exam.

## 2018-09-24 ENCOUNTER — Other Ambulatory Visit: Payer: Self-pay

## 2018-09-24 ENCOUNTER — Ambulatory Visit (INDEPENDENT_AMBULATORY_CARE_PROVIDER_SITE_OTHER): Payer: BC Managed Care – PPO

## 2018-09-24 DIAGNOSIS — Z8659 Personal history of other mental and behavioral disorders: Secondary | ICD-10-CM

## 2018-09-24 NOTE — Progress Notes (Signed)
Patient presented with flat affect, level mood and denied any suicidal or homicidal ideations, no auditory or visual hallucinations and no other complaints. Patient wants to continue the Abilify Maintena as he thinks that it is helping. Patient's due Abilify Maintena 400 mg IM injection prepared as ordered and given to patient in [place] area. Patient tolerated due injection without complaint of pain or discomfort and agreed to return in 4 weeks for next due injection. Patient to call if any problems or change in symptoms prior to next due injection.

## 2018-09-27 ENCOUNTER — Other Ambulatory Visit: Payer: Self-pay

## 2018-09-27 ENCOUNTER — Ambulatory Visit (INDEPENDENT_AMBULATORY_CARE_PROVIDER_SITE_OTHER): Payer: BC Managed Care – PPO | Admitting: Psychiatry

## 2018-09-27 ENCOUNTER — Encounter (HOSPITAL_COMMUNITY): Payer: Self-pay | Admitting: Psychiatry

## 2018-09-27 DIAGNOSIS — F411 Generalized anxiety disorder: Secondary | ICD-10-CM | POA: Diagnosis not present

## 2018-09-27 DIAGNOSIS — F314 Bipolar disorder, current episode depressed, severe, without psychotic features: Secondary | ICD-10-CM | POA: Diagnosis not present

## 2018-09-27 DIAGNOSIS — F319 Bipolar disorder, unspecified: Secondary | ICD-10-CM

## 2018-09-27 DIAGNOSIS — F603 Borderline personality disorder: Secondary | ICD-10-CM | POA: Diagnosis not present

## 2018-09-27 MED ORDER — FLUOXETINE HCL 10 MG PO CAPS: 10.0000 mg | ORAL_CAPSULE | Freq: Every day | ORAL | 1 refills | Status: DC

## 2018-09-27 MED ORDER — ARIPIPRAZOLE ER 400 MG IM SRER: 400.0000 mg | INTRAMUSCULAR | 2 refills | Status: AC

## 2018-09-27 NOTE — Progress Notes (Signed)
Virtual Visit via Telephone Note  I connected with Andrew Noble on 09/27/18 at  1:30 PM EDT by telephone and verified that I am speaking with the correct person using two identifiers.   I discussed the limitations, risks, security and privacy concerns of performing an evaluation and management service by telephone and the availability of in person appointments. I also discussed with the patient that there may be a patient responsible charge related to this service. The patient expressed understanding and agreed to proceed.   History of Present Illness: Patient was evaluated by phone session.  He is not taking Effexor and hydroxyzine because he feels very tired.  He still feel anxious but does not want to take these medication.  He feels his depression mood swings irritability is better on Abilify injection monthly.  He also endorsed financial issues as difficulty affording medication and visits.  He denies any suicidal thoughts or homicidal thought but feel chronic anxiety.  He denies any mania, psychosis, agitation or any crying spells.  He used to work but lately he is not working.  He also moved to a different place and renting a room to save money.  Since the last visit he has not engaged in any self abusive behavior.  He denies any paranoia or any hallucination.  He reported no side effects from the Abilify.  We are providing Abilify samples.  We have recommended DBT but due to pandemic COVID-19 he has been not able to get in touch with the therapist.  He is sleeping okay.  His energy level is fair.  Since he stopped the Effexor and hydroxyzine he feels more motivated but he also feels anxious.  He denies drinking or using any illegal substances.  We have completed his FMLA paperwork but currently he is not working.   Past Psychiatric History:Reviewed. Seeing provider since age 23. H/Osuicidal attemptand multipleinpatienttreatment. At least fourpsychiatric inpatient in 2019. Last in  April at [facility]. H/Ononcompliant with medication. He tried Risperdal, Depakote, propanolol,Wellbutrin, EffexorandNeurontin in the past.Lithium and Lexapro (shakes). Most of the medication was discontinued due to noncompliant.   Psychiatric Specialty Exam: Physical Exam  ROS  There were no vitals taken for this visit.There is no height or weight on file to calculate BMI.  General Appearance: NA  Eye Contact:  NA  Speech:  Clear and Coherent and Slow  Volume:  Normal  Mood:  Anxious  Affect:  NA  Thought Process:  Goal Directed  Orientation:  Full (Time, Place, and Person)  Thought Content:  Rumination  Suicidal Thoughts:  No  Homicidal Thoughts:  No  Memory:  Immediate;   Good Recent;   Good Remote;   Good  Judgement:  Fair  Insight:  Fair  Psychomotor Activity:  NA  Concentration:  Concentration: Fair and Attention Span: Fair  Recall:  Good  Fund of Knowledge:  Good  Language:  Good  Akathisia:  No  Handed:  Right  AIMS (if indicated):     Assets:  Communication Skills Desire for Improvement Resilience  ADL's:  Intact  Cognition:  WNL  Sleep:   fair     Assessment and Plan: Bipolar disorder type I.  Generalized anxiety disorder.  Cluster B traits.  History of eating disorder.  Discussed risk of relapse due to noncompliance with medication.  Patient does not want to go back on Effexor and hydroxyzine since it is making him tired.  He had taken Effexor in the past without any side effects but now  he feels it is making him tired and he cannot afford.  We talked about trying Prozac which he had never tried before.  He agreed to give a try since it is not that expensive.  I will start Prozac 10 mg daily.  He will continue Abilify injection 400 mg intramuscular every month.  He received last injection few days ago.  He is not sure about resuming his work at this time.  Discussed medication side effects and benefits.  Recommended to call us back if is any  question or any concern.  Due to COVID-19 he has not able to contact for DVT.  Discussed safety concern that anytime having active suicidal thoughts or homicidal thoughts and he need to call 911 or go to local emergency room.  Follow-up in 2 months.  Follow Up Instructions:    I discussed the assessment and treatment plan with the patient. The patient was provided an opportunity to ask questions and all were answered. The patient agreed with the plan and demonstrated an understanding of the instructions.   The patient was advised to call back or seek an in-person evaluation if the symptoms worsen or if the condition fails to improve as anticipated.  I provided 15 minutes of non-face-to-face time during this encounter.   Kathlee Nations, MD

## 2018-10-24 ENCOUNTER — Ambulatory Visit (HOSPITAL_COMMUNITY): Payer: Self-pay

## 2018-11-04 DIAGNOSIS — R45851 Suicidal ideations: Secondary | ICD-10-CM | POA: Diagnosis not present

## 2018-11-04 DIAGNOSIS — F603 Borderline personality disorder: Secondary | ICD-10-CM | POA: Diagnosis not present

## 2018-11-04 DIAGNOSIS — F1290 Cannabis use, unspecified, uncomplicated: Secondary | ICD-10-CM | POA: Diagnosis not present

## 2018-11-04 DIAGNOSIS — F329 Major depressive disorder, single episode, unspecified: Secondary | ICD-10-CM | POA: Diagnosis not present

## 2018-11-04 DIAGNOSIS — F319 Bipolar disorder, unspecified: Secondary | ICD-10-CM | POA: Diagnosis not present

## 2018-11-04 DIAGNOSIS — F12122 Cannabis abuse with intoxication with perceptual disturbance: Secondary | ICD-10-CM | POA: Diagnosis not present

## 2018-11-04 DIAGNOSIS — R0602 Shortness of breath: Secondary | ICD-10-CM | POA: Diagnosis not present

## 2018-11-04 DIAGNOSIS — F419 Anxiety disorder, unspecified: Secondary | ICD-10-CM | POA: Diagnosis not present

## 2018-11-04 DIAGNOSIS — R441 Visual hallucinations: Secondary | ICD-10-CM | POA: Diagnosis not present

## 2018-11-05 DIAGNOSIS — K5900 Constipation, unspecified: Secondary | ICD-10-CM | POA: Diagnosis not present

## 2018-11-05 DIAGNOSIS — F329 Major depressive disorder, single episode, unspecified: Secondary | ICD-10-CM | POA: Diagnosis not present

## 2018-11-05 DIAGNOSIS — F332 Major depressive disorder, recurrent severe without psychotic features: Secondary | ICD-10-CM | POA: Diagnosis not present

## 2018-11-05 DIAGNOSIS — F3130 Bipolar disorder, current episode depressed, mild or moderate severity, unspecified: Secondary | ICD-10-CM | POA: Diagnosis not present

## 2018-11-05 DIAGNOSIS — R441 Visual hallucinations: Secondary | ICD-10-CM | POA: Diagnosis not present

## 2018-11-05 DIAGNOSIS — F319 Bipolar disorder, unspecified: Secondary | ICD-10-CM | POA: Diagnosis not present

## 2018-11-05 DIAGNOSIS — R45851 Suicidal ideations: Secondary | ICD-10-CM | POA: Diagnosis not present

## 2018-11-05 DIAGNOSIS — Z9114 Patient's other noncompliance with medication regimen: Secondary | ICD-10-CM | POA: Diagnosis not present

## 2018-11-05 DIAGNOSIS — F603 Borderline personality disorder: Secondary | ICD-10-CM | POA: Diagnosis not present

## 2018-11-05 DIAGNOSIS — F17210 Nicotine dependence, cigarettes, uncomplicated: Secondary | ICD-10-CM | POA: Diagnosis not present

## 2018-11-05 DIAGNOSIS — F419 Anxiety disorder, unspecified: Secondary | ICD-10-CM | POA: Diagnosis not present

## 2018-11-05 DIAGNOSIS — Z590 Homelessness: Secondary | ICD-10-CM | POA: Diagnosis not present

## 2018-11-05 DIAGNOSIS — G4730 Sleep apnea, unspecified: Secondary | ICD-10-CM | POA: Diagnosis not present

## 2018-11-13 ENCOUNTER — Ambulatory Visit (HOSPITAL_COMMUNITY): Payer: BC Managed Care – PPO | Admitting: Psychiatry

## 2018-11-13 ENCOUNTER — Other Ambulatory Visit: Payer: Self-pay

## 2018-11-26 DIAGNOSIS — F4310 Post-traumatic stress disorder, unspecified: Secondary | ICD-10-CM | POA: Diagnosis not present

## 2018-11-26 DIAGNOSIS — F3132 Bipolar disorder, current episode depressed, moderate: Secondary | ICD-10-CM | POA: Diagnosis not present

## 2018-11-26 DIAGNOSIS — F603 Borderline personality disorder: Secondary | ICD-10-CM | POA: Diagnosis not present

## 2018-12-03 ENCOUNTER — Ambulatory Visit (HOSPITAL_COMMUNITY): Payer: BC Managed Care – PPO | Admitting: Psychiatry

## 2018-12-03 ENCOUNTER — Other Ambulatory Visit: Payer: Self-pay

## 2018-12-10 ENCOUNTER — Ambulatory Visit (INDEPENDENT_AMBULATORY_CARE_PROVIDER_SITE_OTHER): Payer: BC Managed Care – PPO | Admitting: Psychiatry

## 2018-12-10 ENCOUNTER — Other Ambulatory Visit: Payer: Self-pay

## 2018-12-10 ENCOUNTER — Encounter (HOSPITAL_COMMUNITY): Payer: Self-pay | Admitting: Psychiatry

## 2018-12-10 DIAGNOSIS — F319 Bipolar disorder, unspecified: Secondary | ICD-10-CM

## 2018-12-10 DIAGNOSIS — F603 Borderline personality disorder: Secondary | ICD-10-CM | POA: Diagnosis not present

## 2018-12-10 DIAGNOSIS — F411 Generalized anxiety disorder: Secondary | ICD-10-CM | POA: Diagnosis not present

## 2018-12-10 MED ORDER — FLUOXETINE HCL 10 MG PO CAPS: 10.0000 mg | ORAL_CAPSULE | Freq: Every day | ORAL | 0 refills | Status: DC

## 2018-12-10 MED ORDER — ARIPIPRAZOLE 10 MG PO TABS: 10.0000 mg | ORAL_TABLET | Freq: Every day | ORAL | 0 refills | Status: DC

## 2018-12-10 NOTE — Progress Notes (Signed)
Virtual Visit via Telephone Note  I connected with Andrew LuisRobert Goucher IV on 12/10/18 at  2:40 PM EDT by telephone and verified that I am speaking with the correct person using two identifiers.   I discussed the limitations, risks, security and privacy concerns of performing an evaluation and management service by telephone and the availability of in person appointments. I also discussed with the patient that there may be a patient responsible charge related to this service. The patient expressed understanding and agreed to proceed.   History of Present Illness: Patient was evaluated by phone session.  He has been non-consistent with follow-ups and injection.  He was last seen in June and then he was [facility] and then [facility] because he was not happy with the [facility] and [facility] he was sent to the old [facility] where he stayed for few days.  He was given injection Abilify at [facility] on his last admission.  He do not recall when it was happened.  He is currently not taking any Prozac and ran out 4 weeks ago.  Like to go back on Prozac.  He is also not sure how he will get injection since he has been not working on a regular basis.  [place] close to his grandparents.  He works on and off.  Denies any irritability but have chronic anxiety.  He admitted Abilify helps his depression and suicidal thoughts but not sure how to get the injection.  In the past he had tried multiple doctors and he has multiple inpatient hospitalization due to noncompliance with treatment and follow-ups.  He admitted smoking marijuana on and off.  I reviewed his blood work results when he was [facility] his U tox was positive for cannabis.  He denies any hallucination or any paranoia.  His energy level is okay.  He is sleeping on and off since he off from Prozac.   Past Psychiatric History:Reviewed. Seeing provider since age 23.  H/Osuicidal attemptand multipleinpatienttreatment. At least fourpsychiatric inpatient in 2019. [facility] in[facility] at [facility].H/Ononcompliant with medication. He tried Risperdal, Depakote, propanolol,Wellbutrin, EffexorandNeurontin in the past.Lithiumand Lexapro (shakes).Most of the medication was discontinued due to noncompliant.    Psychiatric Specialty Exam: Physical Exam  ROS  There were no vitals taken for this visit.There is no height or weight on file to calculate BMI.  General Appearance: NA  Eye Contact:  NA  Speech:  Slow  Volume:  Decreased  Mood:  Dysphoric  Affect:  NA  Thought Process:  Descriptions of Associations: Intact  Orientation:  Full (Time, Place, and Person)  Thought Content:  WDL  Suicidal Thoughts:  No  Homicidal Thoughts:  No  Memory:  Immediate;   Good Recent;   Good Remote;   Good  Judgement:  Other:  Lack of judgment as persistent noncompliant with medication and follow-ups  Insight:  Lacking  Psychomotor Activity:  NA  Concentration:  Concentration: Fair and Attention Span: Fair  Recall:  Good  Fund of Knowledge:  Fair  Language:  Good  Akathisia:  No  Handed:  Right  AIMS (if indicated):     Assets:  Communication Skills Desire for Improvement  ADL's:  Intact  Cognition:  WNL  Sleep:   Fair      Assessment and Plan: Bipolar disorder type I.  Generalized anxiety disorder.  Cluster B traits.  I had a long discussion with the  patient about his persistent noncompliant with medication and follow-up.  He is not interested to get injection in [place] office because he has no transportation.  We talked about referral to [facility] area and he agreed with the plan.  I will provide Abilify 10 mg tablets and Prozac 10 mg tablets for 30 days until he find a new psychiatrist in [facility] area.  We will provide list of the provider where he can contact to continue his treatment.  I discussed safety concern that anytime having  active suicidal thoughts or homicidal thought that he need to call 911 or go to local emergency room.  We will not schedule appointment at this time due to persistent no-shows with treatment, injections, unable to make commitment to come for injection in [place] office and patient preference to stay in [facility] area and to find a provider there.  Time spent 30 minutes.  Follow Up Instructions:    I discussed the assessment and treatment plan with the patient. The patient was provided an opportunity to ask questions and all were answered. The patient agreed with the plan and demonstrated an understanding of the instructions.   The patient was advised to call back or seek an in-person evaluation if the symptoms worsen or if the condition fails to improve as anticipated.  I provided 25 minutes of non-face-to-face time during this encounter.   Kathlee Nations, MD

## 2018-12-11 ENCOUNTER — Encounter (HOSPITAL_COMMUNITY): Payer: Self-pay | Admitting: Psychiatry

## 2019-01-02 ENCOUNTER — Other Ambulatory Visit (HOSPITAL_COMMUNITY): Payer: Self-pay | Admitting: Psychiatry

## 2019-01-02 DIAGNOSIS — F319 Bipolar disorder, unspecified: Secondary | ICD-10-CM

## 2019-01-02 DIAGNOSIS — F603 Borderline personality disorder: Secondary | ICD-10-CM

## 2019-01-07 ENCOUNTER — Other Ambulatory Visit (HOSPITAL_COMMUNITY): Payer: Self-pay

## 2019-01-07 DIAGNOSIS — F603 Borderline personality disorder: Secondary | ICD-10-CM

## 2019-01-07 DIAGNOSIS — F411 Generalized anxiety disorder: Secondary | ICD-10-CM

## 2019-01-07 DIAGNOSIS — F319 Bipolar disorder, unspecified: Secondary | ICD-10-CM

## 2019-01-07 MED ORDER — FLUOXETINE HCL 10 MG PO CAPS: 10.0000 mg | ORAL_CAPSULE | Freq: Every day | ORAL | 0 refills | Status: AC

## 2019-01-07 MED ORDER — ARIPIPRAZOLE 10 MG PO TABS: 10.0000 mg | ORAL_TABLET | Freq: Every day | ORAL | 0 refills | Status: AC

## 2019-03-09 DIAGNOSIS — E669 Obesity, unspecified: Secondary | ICD-10-CM | POA: Diagnosis not present

## 2019-03-09 DIAGNOSIS — R45851 Suicidal ideations: Secondary | ICD-10-CM | POA: Diagnosis not present

## 2019-03-09 DIAGNOSIS — F3160 Bipolar disorder, current episode mixed, unspecified: Secondary | ICD-10-CM | POA: Diagnosis not present

## 2019-03-09 DIAGNOSIS — F17290 Nicotine dependence, other tobacco product, uncomplicated: Secondary | ICD-10-CM | POA: Diagnosis not present

## 2019-03-09 DIAGNOSIS — G4730 Sleep apnea, unspecified: Secondary | ICD-10-CM | POA: Diagnosis not present

## 2019-03-19 ENCOUNTER — Other Ambulatory Visit (HOSPITAL_COMMUNITY): Payer: Self-pay | Admitting: Psychiatry

## 2019-03-19 DIAGNOSIS — F603 Borderline personality disorder: Secondary | ICD-10-CM

## 2019-03-19 DIAGNOSIS — F411 Generalized anxiety disorder: Secondary | ICD-10-CM

## 2019-03-19 DIAGNOSIS — F319 Bipolar disorder, unspecified: Secondary | ICD-10-CM

## 2022-02-26 ENCOUNTER — Ambulatory Visit (HOSPITAL_COMMUNITY)
Admission: EM | Admit: 2022-02-26 | Discharge: 2022-02-27 | Disposition: A | Discharge status: Home / Self Care | Payer: No Payment, Other | Attending: Nurse Practitioner | Admitting: Nurse Practitioner

## 2022-02-26 DIAGNOSIS — R4588 Nonsuicidal self-harm: Secondary | ICD-10-CM | POA: Diagnosis not present

## 2022-02-26 DIAGNOSIS — R45851 Suicidal ideations: Secondary | ICD-10-CM | POA: Insufficient documentation

## 2022-02-26 DIAGNOSIS — F419 Anxiety disorder, unspecified: Secondary | ICD-10-CM

## 2022-02-26 DIAGNOSIS — Z1152 Encounter for screening for COVID-19: Secondary | ICD-10-CM | POA: Diagnosis not present

## 2022-02-26 DIAGNOSIS — F411 Generalized anxiety disorder: Secondary | ICD-10-CM | POA: Insufficient documentation

## 2022-02-26 DIAGNOSIS — Z596 Low income: Secondary | ICD-10-CM | POA: Insufficient documentation

## 2022-02-26 DIAGNOSIS — Z5986 Financial insecurity: Secondary | ICD-10-CM | POA: Insufficient documentation

## 2022-02-26 DIAGNOSIS — Z79899 Other long term (current) drug therapy: Secondary | ICD-10-CM | POA: Insufficient documentation

## 2022-02-26 DIAGNOSIS — F603 Borderline personality disorder: Secondary | ICD-10-CM | POA: Insufficient documentation

## 2022-02-26 DIAGNOSIS — F314 Bipolar disorder, current episode depressed, severe, without psychotic features: Secondary | ICD-10-CM | POA: Insufficient documentation

## 2022-02-26 DIAGNOSIS — Z62810 Personal history of physical and sexual abuse in childhood: Secondary | ICD-10-CM | POA: Insufficient documentation

## 2022-02-26 DIAGNOSIS — Z560 Unemployment, unspecified: Secondary | ICD-10-CM | POA: Insufficient documentation

## 2022-02-26 DIAGNOSIS — Z9151 Personal history of suicidal behavior: Secondary | ICD-10-CM | POA: Insufficient documentation

## 2022-02-26 MED ORDER — CLONIDINE HCL 0.1 MG PO TABS
0.1000 mg | ORAL_TABLET | Freq: Once | ORAL | Status: AC
  Administered 2022-02-26: 0.1 mg via ORAL
  Filled 2022-02-26: qty 1
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
## 2022-02-26 NOTE — ED Triage Notes (Signed)
Pt presents to [facility] voluntarily, unaccompanied at this time with complaint of worsening depression and suicidal ideation with a plan to cut his wrist. Pt identifies family discord and finances as his main triggers at this time. Pt stated "I just don't feel like myself anymore". This writer observed cuts on patient left wrist and pt disclosed that he cut with a razor blade in his car prior to arriving here. Pt reported hx of Bipolar, MDD, GAD. Pt also reports last psychiatric hospitalization at [facility] about a month ago. Pt currently denies HI, AVH and alcohol/substance use.
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
## 2022-02-27 ENCOUNTER — Other Ambulatory Visit: Payer: Self-pay

## 2022-02-27 LAB — CBC WITH DIFFERENTIAL/PLATELET
Abs Immature Granulocytes: 0.05 10*3/uL (ref 0.00–0.07)
Basophils Absolute: 0.2 10*3/uL — ABNORMAL HIGH (ref 0.0–0.1)
Basophils Relative: 2 %
Eosinophils Absolute: 0.5 10*3/uL (ref 0.0–0.5)
Eosinophils Relative: 5 %
HCT: 46.7 % (ref 39.0–52.0)
Hemoglobin: 17.2 g/dL — ABNORMAL HIGH (ref 13.0–17.0)
Immature Granulocytes: 1 %
Lymphocytes Relative: 32 %
Lymphs Abs: 3.1 10*3/uL (ref 0.7–4.0)
MCH: 32.8 pg (ref 26.0–34.0)
MCHC: 36.8 g/dL — ABNORMAL HIGH (ref 30.0–36.0)
MCV: 89 fL (ref 80.0–100.0)
Monocytes Absolute: 0.8 10*3/uL (ref 0.1–1.0)
Monocytes Relative: 8 %
Neutro Abs: 5 10*3/uL (ref 1.7–7.7)
Neutrophils Relative %: 52 %
Platelets: 270 10*3/uL (ref 150–400)
RBC: 5.25 MIL/uL (ref 4.22–5.81)
RDW: 12.4 % (ref 11.5–15.5)
WBC: 9.6 10*3/uL (ref 4.0–10.5)
nRBC: 0 % (ref 0.0–0.2)

## 2022-02-27 LAB — LIPID PANEL
Cholesterol: 191 mg/dL (ref 0–200)
HDL: 34 mg/dL — ABNORMAL LOW (ref >40)
LDL Cholesterol: 119 mg/dL — ABNORMAL HIGH (ref 0–99)
Total CHOL/HDL Ratio: 5.6 RATIO
Triglycerides: 188 mg/dL — ABNORMAL HIGH (ref <150)
VLDL: 38 mg/dL (ref 0–40)

## 2022-02-27 LAB — COMPREHENSIVE METABOLIC PANEL
ALT: 99 U/L — ABNORMAL HIGH (ref 0–44)
AST: 34 U/L (ref 15–41)
Albumin: 4 g/dL (ref 3.5–5.0)
Alkaline Phosphatase: 66 U/L (ref 38–126)
Anion gap: 11 (ref 5–15)
BUN: 15 mg/dL (ref 6–20)
CO2: 25 mmol/L (ref 22–32)
Calcium: 9.5 mg/dL (ref 8.9–10.3)
Chloride: 104 mmol/L (ref 98–111)
Creatinine, Ser: 0.81 mg/dL (ref 0.61–1.24)
GFR, Estimated: >60 mL/min (ref >60)
Glucose, Bld: 104 mg/dL — ABNORMAL HIGH (ref 70–99)
Potassium: 3.5 mmol/L (ref 3.5–5.1)
Sodium: 140 mmol/L (ref 135–145)
Total Bilirubin: 0.4 mg/dL (ref 0.3–1.2)
Total Protein: 6.6 g/dL (ref 6.5–8.1)

## 2022-02-27 LAB — POCT URINE DRUG SCREEN - MANUAL ENTRY (I-SCREEN)
POC Amphetamine UR: NOT DETECTED
POC Buprenorphine (BUP): NOT DETECTED
POC Cocaine UR: NOT DETECTED
POC Marijuana UR: NOT DETECTED
POC Methadone UR: NOT DETECTED
POC Methamphetamine UR: NOT DETECTED
POC Morphine: NOT DETECTED
POC Oxazepam (BZO): NOT DETECTED
POC Oxycodone UR: NOT DETECTED
POC Secobarbital (BAR): NOT DETECTED

## 2022-02-27 LAB — RESP PANEL BY RT-PCR (FLU A&B, COVID) ARPGX2
Influenza A by PCR: NEGATIVE
Influenza B by PCR: NEGATIVE
SARS Coronavirus 2 by RT PCR: NEGATIVE

## 2022-02-27 LAB — TSH: TSH: 2.076 u[IU]/mL (ref 0.350–4.500)

## 2022-02-27 LAB — HEMOGLOBIN A1C
Hgb A1c MFr Bld: 4.9 % (ref 4.8–5.6)
Mean Plasma Glucose: 93.93 mg/dL

## 2022-02-27 LAB — POC SARS CORONAVIRUS 2 AG: SARSCOV2ONAVIRUS 2 AG: NEGATIVE

## 2022-02-27 MED ORDER — QUETIAPINE FUMARATE ER 50 MG PO TB24: 50.0000 mg | ORAL_TABLET | Freq: Every day | ORAL | Status: DC

## 2022-02-27 MED ORDER — FLUOXETINE HCL 10 MG PO CAPS
10.0000 mg | ORAL_CAPSULE | Freq: Every day | ORAL | Status: DC
  Filled 2022-02-27: qty 1

## 2022-02-27 MED ORDER — OXCARBAZEPINE 300 MG PO TABS: 300.0000 mg | ORAL_TABLET | Freq: Two times a day (BID) | ORAL | Status: DC

## 2022-02-27 MED ORDER — TRAZODONE HCL 50 MG PO TABS
50.0000 mg | ORAL_TABLET | Freq: Every evening | ORAL | Status: DC | PRN
  Administered 2022-02-27: 50 mg via ORAL
  Filled 2022-02-27: qty 1

## 2022-02-27 MED ORDER — HYDROXYZINE HCL 25 MG PO TABS
25.0000 mg | ORAL_TABLET | Freq: Three times a day (TID) | ORAL | Status: DC | PRN
  Administered 2022-02-27: 25 mg via ORAL
  Filled 2022-02-27: qty 1

## 2022-02-27 MED ORDER — ALUM & MAG HYDROXIDE-SIMETH 200-200-20 MG/5ML PO SUSP: 30.0000 mL | ORAL | Status: DC | PRN

## 2022-02-27 MED ORDER — MAGNESIUM HYDROXIDE 400 MG/5ML PO SUSP: 30.0000 mL | Freq: Every day | ORAL | Status: DC | PRN

## 2022-02-27 MED ORDER — ACETAMINOPHEN 325 MG PO TABS: 650.0000 mg | ORAL_TABLET | Freq: Four times a day (QID) | ORAL | Status: DC | PRN

## 2022-02-27 NOTE — ED Notes (Signed)
Patient is unable to sleep , he was given 2 prn ,medications for sleep Vistaril and Trazodone. Patient decided to take a shower since he was not able to sleep. He was provided towel and toiletries. Will continue to monitor for safety.

## 2022-02-27 NOTE — Progress Notes (Signed)
Received Andrew Noble this AM, sleep in his chair bed, he woke up on his own, received breakfast and later received his discharge order. He was presented with his AVS, questions answered and received his personal belongings. He was discharged without incident.

## 2022-02-27 NOTE — ED Notes (Signed)
Patient is now resting quietly with eyes closed sfter taking his shower. No sisg/symptoms opf distress. Will continue to monitor for safety.

## 2022-02-27 NOTE — Discharge Instructions (Addendum)
Take all medications as prescribed. Keep all follow-up appointments as scheduled.  Do not consume alcohol or use illegal drugs while on prescription medications. Report any adverse effects from your medications to your primary care provider promptly.  In the event of recurrent symptoms or worsening symptoms, call 911, a crisis hotline, or go to the nearest emergency department for evaluation.   

## 2022-02-27 NOTE — ED Notes (Addendum)
Patient is alert  nd oriented x 4 pleasant and cooperative. Patient brought himself to clinic voluntarily with stronf suicidal ideations. Patient is calm and cooperative with admission process.  Unable to perform blood draw, patient BP was elevated on arrival patient was given Clonidine BP returned to normal limits. Patient has some body odor was too tired to take a shower. He is resting quietly in bed with eyes closed. No S/S of distress will continue to monitor for safety.

## 2022-02-27 NOTE — ED Provider Notes (Addendum)
[facility]/OBS ASAP Discharge Summary  Date and Time: 02/27/2022 8:46 AM  Name: Andrew Noble  MRN:  [id number]   Discharge Diagnoses:  Final diagnoses:  Suicidal ideations  Anxiety    Subjective: Pt is a 26 year old male with a pphx of borderline personality disorder, MDD, and anxiety who presented voluntarily for suicidal ideation with a plan to stab himself. Pt report two recent admissions with [facility] and [facility]. Reports at these admissions and discharges he was given outpatient resources. Reports that his current stressors are interpersonal conflict and financial stressors. Pt denying any suicidal ideations, homicidal ideations, or AVH. Pt reports he thinks he needs medication changes but reports that he doesn't "get along" with a psychiatrist at the [facility] services of the [place]. He reported he was also referred to [facility] but refused to do therapy with them.   Stay Summary: Pt was admitted to observation for suicidal ideation and crisis management.  Medical problems were identified and treated as needed. Pt encouraged to continue home medications. Improvement was monitored by observation and Pt 's report of symptom reduction.        Pt was evaluated by the treatment team for stability and plans for continued recovery upon discharge. Pt 's motivation was an integral factor for scheduling further treatment. Employment, transportation, bed availability, health status, family support, and any pending legal issues were also considered during hospital stay. Pt was offered further treatment options upon discharge including outpatient treatment.  Pt will follow up with the services as listed below under Follow Up Information. It is noted that pt has a history of treatment and medication noncompliance with multiple ED utilizations.    Upon completion of this admission the patient was both mentally and medically stable for discharge denying suicidal/homicidal ideation,  auditory/visual/tactile hallucinations, delusional thoughts and paranoia.    Pt responded well to staying in observation over night. Pt demonstrated improvement without reported or observed adverse effects to the point of stability appropriate for outpatient management. Reviewed CBC, CMP, BAL, and UDS.   Total Time spent with patient: 15 minutes  Past Psychiatric History: Pt has a pphx of borderline personality disorder, MDD, and GAD. Pt recently with admission to [facility] where he was asking for a Bipolar Diagnosis. He was also recently admitted to [facility] about one month ago. Pt has history of high ED utilization and several admissions.   Past Medical History:  Past Medical History:  Diagnosis Date   Abnormal laboratory test 02/12/2014   Anxiety    Bipolar disorder (HCC)    Depression    Hx of substance abuse (HCC) 02/12/2014   Psychosis (HCC)     Past Surgical History:  Procedure Laterality Date   TONSILLECTOMY AND ADENOIDECTOMY     Family History:  Family History  Problem Relation Age of Onset   Depression Mother    Anxiety disorder Mother    Bipolar disorder Maternal Aunt    Family Psychiatric History: See above.   Social History:  Social History   Substance and Sexual Activity  Alcohol Use Not Currently   Alcohol/week: 1.0 - 2.0 standard drink of alcohol   Types: 1 - 2 Cans of beer per week     Social History   Substance and Sexual Activity  Drug Use Not Currently   Comment: no longer using    Social History   Socioeconomic History   Marital status: Single    Spouse name: Not on file  Number of children: Not on file   Years of education: Not on file   Highest education level: Not on file  Occupational History   Not on file  Tobacco Use   Smoking status: Former    Packs/day: 0.25    Years: 1.00    Total pack years: 0.25    Types: Cigarettes    Quit date: 03/28/2018    Years since quitting: 3.9   Smokeless tobacco:  Former    Types: Chew    Quit date: 03/28/2018  Vaping Use   Vaping Use: Every day  Substance and Sexual Activity   Alcohol use: Not Currently    Alcohol/week: 1.0 - 2.0 standard drink of alcohol    Types: 1 - 2 Cans of beer per week   Drug use: Not Currently    Comment: no longer using   Sexual activity: Never  Other Topics Concern   Not on file  Social History Narrative   Not on file   Social Determinants of Health   Financial Resource Strain: Not on file  Food Insecurity: Not on file  Transportation Needs: Not on file  Physical Activity: Not on file  Stress: Not on file  Social Connections: Not on file   SDOH:  SDOH Screenings   Alcohol Screen: Low Risk  (04/01/2018)  Tobacco Use: Medium Risk (12/10/2018)    Tobacco Cessation:  N/A, patient does not currently use tobacco products  Current Medications:  Current Facility-Administered Medications  Medication Dose Route Frequency Provider Last Rate Last Admin   acetaminophen (TYLENOL) tablet 650 mg  650 mg Oral Q6H PRN Onuoha, Chinwendu V, NP       alum & mag hydroxide-simeth (MAALOX/MYLANTA) 200-200-20 MG/5ML suspension 30 mL  30 mL Oral Q4H PRN Onuoha, Chinwendu V, NP       FLUoxetine (PROZAC) capsule 10 mg  10 mg Oral Daily Onuoha, Chinwendu V, NP       hydrOXYzine (ATARAX) tablet 25 mg  25 mg Oral TID PRN Onuoha, Chinwendu V, NP   25 mg at 02/27/22 0223   magnesium hydroxide (MILK OF MAGNESIA) suspension 30 mL  30 mL Oral Daily PRN Onuoha, Chinwendu V, NP       Oxcarbazepine (TRILEPTAL) tablet 300 mg  300 mg Oral BID Onuoha, Chinwendu V, NP       QUEtiapine (SEROQUEL XR) 24 hr tablet 50 mg  50 mg Oral QHS Onuoha, Chinwendu V, NP       traZODone (DESYREL) tablet 50 mg  50 mg Oral QHS PRN Onuoha, Chinwendu V, NP   50 mg at 02/27/22 0223   Current Outpatient Medications  Medication Sig Dispense Refill   ARIPiprazole (ABILIFY) 10 MG tablet Take 1 tablet (10 mg total) by mouth daily. 30 tablet 0   ARIPiprazole ER  (ABILIFY MAINTENA) 400 MG SRER injection Inject 2 mLs (400 mg total) into the muscle every 28 (twenty-eight) days. 1 each 2   FLUoxetine (PROZAC) 10 MG capsule Take 1 capsule (10 mg total) by mouth daily. 30 capsule 0    PTA Medications: (Not in a hospital admission)      09/22/2016   10:09 AM 04/21/2016   10:36 AM  Depression screen PHQ 2/9  Decreased Interest 3 3  Down, Depressed, Hopeless 2 2  PHQ - 2 Score 5 5  Altered sleeping 3 3  Tired, decreased energy 3 3  Change in appetite 3 3  Feeling bad or failure about yourself  3 3  Trouble concentrating 3 1  Moving slowly or fidgety/restless 3 3  Suicidal thoughts 0 0  PHQ-9 Score 23 21  Difficult doing work/chores  Very difficult    Flowsheet Row ED from 02/26/2022 in [facility] Admission (Discharged) [facility] OP [facility] (Discharged[facility] from OP [facility]  C-SSRS RISK CATEGORY High Risk High Risk High Risk       Musculoskeletal  Strength & Muscle Tone: within normal limits Gait & Station: normal Patient leans: N/A  Psychiatric Specialty Exam  Presentation  General Appearance:  Appropriate for Environment; Fairly Groomed  Eye Contact: Good  Speech: Clear and Coherent  Speech Volume: Normal  Handedness: Right   Mood and Affect  Mood: Euthymic  Affect: Appropriate   Thought Process  Thought Processes: Coherent; Goal Directed  Descriptions of Associations:Intact  Orientation:Full (Time, Place and Person)  Thought Content:WDL; Logical  Diagnosis of Schizophrenia or Schizoaffective disorder in past: No    Hallucinations:Hallucinations: None  Ideas of Reference:None  Suicidal Thoughts:Suicidal Thoughts: No SI Active Intent and/or Plan: With Plan  Homicidal Thoughts:Homicidal Thoughts: No   Sensorium  Memory: Immediate Fair; Recent Fair;  Remote Fair  Judgment: Fair  Insight: Poor   Executive Functions  Concentration: Fair  Attention Span: Fair  Recall: Fair  Fund of Knowledge: Fair  Language: Good   Psychomotor Activity  Psychomotor Activity: Psychomotor Activity: Normal   Assets  Assets: Housing; Transportation   Sleep  Sleep: Sleep: Fair   Nutritional Assessment (For OBS and [facility] admissions only) Has the patient had a weight loss or gain of 10 pounds or more in the last 3 months?: No Has the patient had a decrease in food intake/or appetite?: No Does the patient have dental problems?: No Does the patient have eating habits or behaviors that Sarea Fyfe be indicators of an eating disorder including binging or inducing vomiting?: No Has the patient recently lost weight without trying?: 0 Has the patient been eating poorly because of a decreased appetite?: 0 Malnutrition Screening Tool Score: 0    Physical Exam  Physical Exam ROS Blood pressure 109/66, pulse 86, temperature 97.6 F (36.4 C), temperature source Oral, resp. rate 18, SpO2 97 %. There is no height or weight on file to calculate BMI.  Demographic Factors:  Male, Divorced or widowed, and Caucasian  Loss Factors: Loss of significant relationship and Financial problems/change in socioeconomic status  Historical Factors: Prior suicide attempts  Risk Reduction Factors:   Sense of responsibility to family  Continued Clinical Symptoms:  Personality Disorders:   Cluster B  Cognitive Features That Contribute To Risk:  None    Suicide Risk:  Minimal: No identifiable suicidal ideation.  Patients presenting with no risk factors but with morbid ruminations; Riley Papin be classified as minimal risk based on the severity of the depressive symptoms  Plan Of Care/Follow-up recommendations:  Activity:  As tolerated Diet:  Heart healthy  Disposition: Take all medications as prescribed. Keep all follow-up appointments as scheduled.  Do not  consume alcohol or use illegal drugs while on prescription medications. Report any adverse effects from your medications to your primary care provider promptly.  In the event of recurrent symptoms or worsening symptoms, call 911, a crisis hotline, or go to the nearest emergency department for evaluation.   Trinidad Petron, NP 02/27/2022, 8:46 AM

## 2022-02-27 NOTE — ED Notes (Signed)
Patient is awake, complained about  the bright lights and the loud noise.for the doors shutting.

## 2022-02-27 NOTE — ED Notes (Signed)
Blood draw attempted x3 by two separate nurses, without flash or blood return.  Will encourage fluids and attempt again if pt permits
